# Patient Record
Sex: Female | Born: 1935
Health system: Southern US, Community
[De-identification: ages and names within clinical notes are randomized; demographics above are authoritative.]

## PROBLEM LIST (undated history)

## (undated) DIAGNOSIS — E079 Disorder of thyroid, unspecified: Secondary | ICD-10-CM

## (undated) DIAGNOSIS — K635 Polyp of colon: Secondary | ICD-10-CM

## (undated) DIAGNOSIS — D519 Vitamin B12 deficiency anemia, unspecified: Secondary | ICD-10-CM

## (undated) DIAGNOSIS — R443 Hallucinations, unspecified: Secondary | ICD-10-CM

## (undated) DIAGNOSIS — M542 Cervicalgia: Secondary | ICD-10-CM

## (undated) DIAGNOSIS — I10 Essential (primary) hypertension: Secondary | ICD-10-CM

## (undated) DIAGNOSIS — N179 Acute kidney failure, unspecified: Secondary | ICD-10-CM

## (undated) DIAGNOSIS — F0390 Unspecified dementia without behavioral disturbance: Secondary | ICD-10-CM

## (undated) DIAGNOSIS — E785 Hyperlipidemia, unspecified: Secondary | ICD-10-CM

## (undated) DIAGNOSIS — Q433 Congenital malformations of intestinal fixation: Secondary | ICD-10-CM

## (undated) DIAGNOSIS — I639 Cerebral infarction, unspecified: Secondary | ICD-10-CM

## (undated) DIAGNOSIS — E039 Hypothyroidism, unspecified: Secondary | ICD-10-CM

## (undated) DIAGNOSIS — G9341 Metabolic encephalopathy: Secondary | ICD-10-CM

## (undated) DIAGNOSIS — R45851 Suicidal ideations: Secondary | ICD-10-CM

## (undated) HISTORY — DX: Suicidal ideations: R45.851

## (undated) HISTORY — DX: Vitamin B12 deficiency anemia, unspecified: D51.9

## (undated) HISTORY — DX: Congenital malformations of intestinal fixation: Q43.3

## (undated) HISTORY — DX: Cervicalgia: M54.2

## (undated) HISTORY — DX: Cerebral infarction, unspecified: I63.9

## (undated) HISTORY — PX: TOTAL ABDOMINAL HYSTERECTOMY: SHX209

## (undated) HISTORY — DX: Hallucinations, unspecified: R44.3

## (undated) HISTORY — DX: Essential (primary) hypertension: I10

## (undated) HISTORY — DX: Metabolic encephalopathy: G93.41

## (undated) HISTORY — DX: Hyperlipidemia, unspecified: E78.5

## (undated) HISTORY — DX: Polyp of colon: K63.5

## (undated) HISTORY — PX: SHOULDER SURGERY: SHX246

## (undated) HISTORY — PX: CHOLECYSTECTOMY: SHX55

## (undated) HISTORY — DX: Acute kidney failure, unspecified: N17.9

## (undated) HISTORY — DX: Hypothyroidism, unspecified: E03.9

---

## 1981-07-20 HISTORY — PX: LUMBAR SPINE SURGERY: SHX701

## 1998-12-18 ENCOUNTER — Other Ambulatory Visit: Admission: RE | Admit: 1998-12-18 | Discharge: 1998-12-18 | Payer: Self-pay | Admitting: Obstetrics and Gynecology

## 2000-01-14 ENCOUNTER — Other Ambulatory Visit: Admission: RE | Admit: 2000-01-14 | Discharge: 2000-01-14 | Payer: Self-pay | Admitting: Obstetrics and Gynecology

## 2000-08-11 ENCOUNTER — Ambulatory Visit (HOSPITAL_COMMUNITY): Admission: RE | Admit: 2000-08-11 | Discharge: 2000-08-11 | Payer: Self-pay | Admitting: Internal Medicine

## 2001-02-07 ENCOUNTER — Other Ambulatory Visit: Admission: RE | Admit: 2001-02-07 | Discharge: 2001-02-07 | Payer: Self-pay | Admitting: Obstetrics and Gynecology

## 2001-07-20 HISTORY — PX: OTHER SURGICAL HISTORY: SHX169

## 2001-08-04 ENCOUNTER — Observation Stay (HOSPITAL_COMMUNITY): Admission: RE | Admit: 2001-08-04 | Discharge: 2001-08-05 | Payer: Self-pay | Admitting: Orthopedic Surgery

## 2001-12-20 ENCOUNTER — Encounter: Payer: Self-pay | Admitting: Specialist

## 2001-12-20 ENCOUNTER — Ambulatory Visit (HOSPITAL_COMMUNITY): Admission: RE | Admit: 2001-12-20 | Discharge: 2001-12-20 | Payer: Self-pay | Admitting: Specialist

## 2002-02-22 ENCOUNTER — Other Ambulatory Visit: Admission: RE | Admit: 2002-02-22 | Discharge: 2002-02-22 | Payer: Self-pay | Admitting: Gynecology

## 2003-01-15 ENCOUNTER — Encounter: Payer: Self-pay | Admitting: Specialist

## 2003-01-15 ENCOUNTER — Encounter: Admission: RE | Admit: 2003-01-15 | Discharge: 2003-01-15 | Payer: Self-pay | Admitting: Specialist

## 2003-03-01 ENCOUNTER — Other Ambulatory Visit: Admission: RE | Admit: 2003-03-01 | Discharge: 2003-03-01 | Payer: Self-pay | Admitting: Gynecology

## 2003-03-29 ENCOUNTER — Encounter: Payer: Self-pay | Admitting: Neurology

## 2003-03-29 ENCOUNTER — Ambulatory Visit (HOSPITAL_COMMUNITY): Admission: RE | Admit: 2003-03-29 | Discharge: 2003-03-29 | Payer: Self-pay | Admitting: Neurology

## 2003-12-18 ENCOUNTER — Ambulatory Visit (HOSPITAL_COMMUNITY): Admission: RE | Admit: 2003-12-18 | Discharge: 2003-12-18 | Payer: Self-pay | Admitting: Specialist

## 2003-12-20 ENCOUNTER — Ambulatory Visit (HOSPITAL_COMMUNITY): Admission: RE | Admit: 2003-12-20 | Discharge: 2003-12-20 | Payer: Self-pay | Admitting: Gastroenterology

## 2004-03-03 ENCOUNTER — Other Ambulatory Visit: Admission: RE | Admit: 2004-03-03 | Discharge: 2004-03-03 | Payer: Self-pay | Admitting: Gynecology

## 2005-05-22 ENCOUNTER — Encounter: Admission: RE | Admit: 2005-05-22 | Discharge: 2005-05-22 | Payer: Self-pay | Admitting: Orthopedic Surgery

## 2006-02-17 HISTORY — PX: OTHER SURGICAL HISTORY: SHX169

## 2006-04-13 ENCOUNTER — Ambulatory Visit (HOSPITAL_BASED_OUTPATIENT_CLINIC_OR_DEPARTMENT_OTHER): Admission: RE | Admit: 2006-04-13 | Discharge: 2006-04-13 | Payer: Self-pay | Admitting: Orthopedic Surgery

## 2006-10-09 ENCOUNTER — Emergency Department (HOSPITAL_COMMUNITY): Admission: EM | Admit: 2006-10-09 | Discharge: 2006-10-09 | Payer: Self-pay | Admitting: Emergency Medicine

## 2007-01-04 ENCOUNTER — Encounter: Admission: RE | Admit: 2007-01-04 | Discharge: 2007-01-04 | Payer: Self-pay | Admitting: Internal Medicine

## 2007-01-06 ENCOUNTER — Ambulatory Visit (HOSPITAL_COMMUNITY): Admission: RE | Admit: 2007-01-06 | Discharge: 2007-01-06 | Payer: Self-pay | Admitting: Internal Medicine

## 2007-03-03 ENCOUNTER — Other Ambulatory Visit: Admission: RE | Admit: 2007-03-03 | Discharge: 2007-03-03 | Payer: Self-pay | Admitting: Gynecology

## 2007-07-29 ENCOUNTER — Ambulatory Visit: Payer: Self-pay | Admitting: Vascular Surgery

## 2007-10-20 ENCOUNTER — Encounter: Admission: RE | Admit: 2007-10-20 | Discharge: 2007-10-20 | Payer: Self-pay | Admitting: Neurosurgery

## 2009-03-22 ENCOUNTER — Encounter: Admission: RE | Admit: 2009-03-22 | Discharge: 2009-03-22 | Payer: Self-pay | Admitting: Gastroenterology

## 2009-07-20 HISTORY — PX: OTHER SURGICAL HISTORY: SHX169

## 2010-03-12 ENCOUNTER — Encounter: Payer: Self-pay | Admitting: Internal Medicine

## 2010-07-11 ENCOUNTER — Ambulatory Visit: Payer: Self-pay | Admitting: Internal Medicine

## 2010-07-11 DIAGNOSIS — E785 Hyperlipidemia, unspecified: Secondary | ICD-10-CM | POA: Insufficient documentation

## 2010-07-11 DIAGNOSIS — I10 Essential (primary) hypertension: Secondary | ICD-10-CM

## 2010-07-11 DIAGNOSIS — R05 Cough: Secondary | ICD-10-CM

## 2010-07-11 DIAGNOSIS — R0602 Shortness of breath: Secondary | ICD-10-CM

## 2010-08-10 ENCOUNTER — Encounter: Payer: Self-pay | Admitting: Specialist

## 2010-08-21 NOTE — Assessment & Plan Note (Signed)
Summary: Pulmonary/ new pt eval > try off ace   Visit Type:  Initial Consult Copy to:  Dr. Jarome Matin Primary Provider/Referring Provider:  Dr. Jarome Matin  CC:  Dyspnea.  History of Present Illness: 18 yowf never smoker no previous h/o resp problems even as child referred by Dr Jarold Motto for recurrent cough since Aug 2011  July 11, 2010  1st pulmonary office eval cc chronic cough since Aug 2011 never completely better with nl cxr then but much worse early Dec much better since rx with pred and zpak and heavy cough suppression and some better on day on ov. Only sob when coughing.    Pt denies any significant sore throat, dysphagia, itching, sneezing,  nasal congestion or excess secretions,  fever, chills, sweats, unintended wt loss, pleuritic or exertional cp, hempoptysis, change in activity tolerance  orthopnea pnd or leg swelling. Pt also denies any obvious fluctuation in symptoms with weather or environmental change or other alleviating or aggravating factors.       Current Medications (verified): 1)  Synthroid 125 Mcg Tabs (Levothyroxine Sodium) .Marland Kitchen.. 1 Once Daily 2)  Lipitor 40 Mg Tabs (Atorvastatin Calcium) .Marland Kitchen.. 1 Once Daily 3)  Lisinopril 5 Mg Tabs (Lisinopril) .Marland Kitchen.. 1 Once Daily 4)  [facility] Aspirin 81 Mg Tbec (Aspirin) .Marland Kitchen.. 1 Once Daily 5)  Multivitamins  Tabs (Multiple Vitamin) .Marland Kitchen.. 1 Once Daily 6)  Zolpidem Tartrate 10 Mg Tabs (Zolpidem Tartrate) .Marland Kitchen.. 1 To 2 At Bedtime As Needed  Allergies (verified): 1)  ! Pcn  Past History:  Past Medical History: Hypertension    - Try off ace July 11, 2010 due to cough  Past Surgical History: Cholecystectomy  1992 Back surgery 1980 Hysterectomy 1970 Rt foot surgery 2011  Family History: Negative for respiratory diseases or atopy   Social History: Married Chiildren Retired Recruitment consultant Never smoker  No ETOH  Review of Systems       The patient complains of shortness of breath with activity, shortness  of breath at rest, productive cough, and change in color of mucus.  The patient denies non-productive cough, coughing up blood, chest pain, irregular heartbeats, acid heartburn, indigestion, loss of appetite, weight change, abdominal pain, difficulty swallowing, sore throat, tooth/dental problems, headaches, nasal congestion/difficulty breathing through nose, sneezing, itching, ear ache, anxiety, depression, hand/feet swelling, joint stiffness or pain, rash, and fever.    Vital Signs:  Patient profile:   76 year old female Height:      65 inches Weight:      184.38 pounds BMI:     30.79 O2 Sat:      96 % on Room air Temp:     97.7 degrees F oral Pulse rate:   85 / minute BP sitting:   136 / 82  (left arm)  Vitals Entered By: Vernie Murders (July 11, 2010 11:17 AM)  O2 Flow:  Room air  Physical Exam  Additional Exam:  anxious wf nad wt 184 July 11, 2010 pseudowheeze resolves with purse lip maneuver  HEENT: nl dentition, turbinates, and orophanx. Nl external ear canals without cough reflex Neck without JVD/Nodes/TM Lungs clear to A and P bilaterally without cough on insp or exp maneuvers RRR no s3 or murmur or increase in P2 Abd soft and benign with nl excursion in the supine position. No bruits or organomegaly Ext warm without calf tenderness, cyanosis clubbing or edema Skin warm and dry without lesions   Neuro alert, approp without deficits   CXR  Procedure date:  03/12/2010  Findings:      No active disease  Impression & Recommendations:  Problem # 1:  COUGH (ICD-786.2) The most common causes of chronic cough in immunocompetent adults include: upper airway cough syndrome (UACS), previously referred to as postnasal drip syndrome,  caused by variety of rhinosinus conditions; (2) asthma; (3) GERD; (4) chronic bronchitis from cigarette smoking or other inhaled environmental irritants; (5) nonasthmatic eosinophilic bronchitis; and (6) bronchiectasis. These conditions,  singly or in combination, have accounted for up to 94% of the causes of chronic cough in prospective studies.   Classic Upper airway cough syndrome, so named because it's frequently impossible to sort out how much is  CR/sinusitis with freq throat clearing (which can be related to primary GERD)   vs  causing  secondary extra esophageal GERD from wide swings in gastric pressure that occur with throat clearing, promoting self use of mint and menthol lozenges that reduce the lower esophageal sphincter tone and exacerbate the problem further These are the same pts who not infrequently have failed to tolerate ace inhibitors,  dry powder inhalers or biphosphonates or report having reflux symptoms that don't respond to standard doses of PPI  Try off ace first then regroup if not better  Orders: New Patient Level V (95621)  Medications Added to Medication List This Visit: 1)  Synthroid 125 Mcg Tabs (Levothyroxine sodium) .Marland Kitchen.. 1 once daily 2)  Lipitor 40 Mg Tabs (Atorvastatin calcium) .Marland Kitchen.. 1 once daily 3)  Lisinopril 5 Mg Tabs (Lisinopril) .Marland Kitchen.. 1 once daily 4)  [facility] Aspirin 81 Mg Tbec (Aspirin) .Marland Kitchen.. 1 once daily 5)  Multivitamins Tabs (Multiple vitamin) .Marland Kitchen.. 1 once daily 6)  Zolpidem Tartrate 10 Mg Tabs (Zolpidem tartrate) .Marland Kitchen.. 1 to 2 at bedtime as needed 7)  Diovan 80 Mg Tabs (Valsartan) .... One daily  Patient Instructions: 1)  Prilosec before bfast and pepcid 20 mg at bedtime as long as coughing ( reflux is to cough what oxygen is to fire)  2)  Stop lisinopril 3)  Start Diovan 80 mg one daily and if 100% better tell Dr Jarold Motto and he will give you a substitute for lisnopril and if not return here.

## 2010-12-02 NOTE — Procedures (Signed)
DUPLEX DEEP VENOUS EXAM - LOWER EXTREMITY   INDICATION:  Right thigh pain.   HISTORY:  Edema:  No.  Trauma/Surgery:  No.  Pain:  Yes.  PE:  No.  Previous DVT:  No.  Anticoagulants:  Other:   DUPLEX EXAM:                CFV   SFV   PopV  PTV    GSV                R  L  R  L  R  L  R   L  R  L  Thrombosis    o  o  o     o     o      o  Spontaneous   +  +  +     +     +      +  Phasic        +  +  +     +     +      +  Augmentation  +  +  +     +     +      +  Compressible  +  +  +     +     +      +  Competent     +  +  +     +     +      +   Legend:  + - yes  o - no  p - partial  D - decreased   IMPRESSION:  No evidence of deep venous thrombosis noted in the right  leg.   Notify D.J. at Dr. Silvano Rusk office.    _____________________________  Di Kindle. Edilia Bo, M.D.   MG/MEDQ  D:  07/29/2007  T:  07/29/2007  Job:  [id number]

## 2010-12-05 NOTE — Op Note (Signed)
NAMELUCILL, Carpenter               ACCOUNT NO.:  [id number]   MEDICAL RECORD NO.:  [id number]          PATIENT TYPE:  AMB   LOCATION:  DSC                          FACILITY:  [facility]   PHYSICIAN:  Katy Fitch. Sypher, M.D. DATE OF BIRTH:  11/08/1934   DATE OF PROCEDURE:  04/13/2006  DATE OF DISCHARGE:                                 OPERATIVE REPORT   PREOPERATIVE DIAGNOSIS:  Mass left ring finger flexor sheath with adjacent  palmar fibromatosis.   POSTOPERATIVE DIAGNOSIS:  Mass left ring finger flexor sheath with adjacent  palmar fibromatosis with identification of palmar fibromatosis involving the  pre-tendinous fibers of the left ring finger and an A1 pulley ganglion.   OPERATION:  Excision of flexor ganglion and release of the tendinous fibers  of palmar fascia superficial to the left ring flexor sheath.   OPERATIONS:  Katy Fitch. Sypher, M.D.   ASSISTANT:  Marveen Reeks. Dasnoit, P.A.-C.   ANESTHESIA:  General sedation and 2% lidocaine metacarpal head level block  of left ring finger.   SUPERVISING ANESTHESIOLOGIST:  Germaine Pomfret, M.D.   INDICATIONS:  Rachael Carpenter is a 76 year old woman referred through the  courtesy of Dr. Dossie Arbour for evaluation and management of a mass in her  left palm.  Clinical examination revealed signs of a flexor sheath ganglion  and early palmar fibromatosis. After informed consent, she is brought to the  operating at this time anticipating excisional biopsy of the mass.   PROCEDURE:  Rachael Carpenter is brought to the operating room and placed in the  supine position on the operating room table.  Following light sedation, the  left arm was prepped with Betadine soap solution and sterilely draped.  2%  lidocaine was infiltrated in the flexor sheath and into the path of the  intended incision.  When anesthesia was satisfactory, the arm was  exsanguinated with an Esmarch bandage and an arterial tourniquet on the  proximal brachium inflated to w50  mmHg.  The procedure commenced with a  transverse incision just distal to the distal palmar crease.  The  subcutaneous tissues were carefully divided revealing the palmar fascia.  The contracted bands of the per-tendinous fibers were resected.  The flexor  sheath ganglion was then circumferentially dissected.  The mass was removed  with rongeurs.  The flexion sheath was cleaned to normal tissues.  The wound  was then repaired with mattress suture of 5-0 nylon.  There were no apparent  complications.  The wound was dressed with Xeroflow, sterile gauze, and a  Coban dressing.   For aftercare, Rachael Carpenter is provided with a prescription for Darvocet  N100, 1 p.o. q.4-6h. p.r.n. pain, 20 tablets without refill.  She will  return to see me in the office for follow-up in one week.      Katy Fitch Sypher, M.D.  Electronically Signed     RVS/MEDQ  D:  04/13/2006  T:  04/14/2006  Job:  [id number]   cc:   Barry Dienes. Eloise Harman, M.D.

## 2010-12-05 NOTE — Op Note (Signed)
NAME:  Rachael Carpenter, Rachael Carpenter                         ACCOUNT NO.:  [id number]   MEDICAL RECORD NO.:  [id number]                   PATIENT TYPE:  AMB   LOCATION:  ENDO                                 FACILITY:  [facility]   PHYSICIAN:  Bernette Redbird, M.D.                DATE OF BIRTH:  12/11/1934   DATE OF PROCEDURE:  12/20/2003  DATE OF DISCHARGE:                                 OPERATIVE REPORT   PROCEDURE:  Colonoscopy   INDICATIONS:  Family history of colon cancer.  Colonoscopy six years ago  negative.   FINDINGS:  Normal exam to the cecum.   DESCRIPTION OF PROCEDURE:  The nature, purpose and risk of the procedure  were familiar to the patient from prior examination.  She provided consent.  Sedation was fentanyl 62.5 mcg and Versed 6 mg IV; without arrhythmias or  desaturation.  The Olympus adjustable tension pediatric video colonoscope  was readily advanced to the cecum, using some external abdominal compression  that controlled looping.  The cecum was identified by clear visualization of  the appendiceal orifice.  Pullback was then performed.  The quality of the  prep was excellent.  It was felt that all areas were well seen.   This was a normal examination.  No polyps, cancer, vascular malformations or  diverticulosis were noted.  Retroflexion of the rectum was normal.  No  biopsies were obtained.  She tolerated the procedure well and without  apparent complications.   IMPRESSION:  Normal colonoscopy, in a patient with a family history of colon  cancer (V16.0).   PLAN:  Follow-up colonoscopy in five years for continued screening, in a  patient who is probably at somewhat increased risk for colon cancer in view  of her family history.                                               Bernette Redbird, M.D.    RB/MEDQ  D:  12/20/2003  T:  12/20/2003  Job:  [id number]   cc:   Barry Dienes. Eloise Harman, M.D.  [address]  [place]  [place] [address]  Fax: [phone]

## 2010-12-05 NOTE — Op Note (Signed)
[facility]  Patient:    Rachael Carpenter, Rachael Carpenter Visit Number: [id number] MRN: [id number]          Service Type: SUR Location: 4W 9562 01 Attending Physician:  Verlee Rossetti. Dictated by:   Malon Kindle, M.D. Proc. Date: 08/04/01 Admit Date:  08/04/2001 Discharge Date: 08/05/2001                             Operative Report  PREOPERATIVE DIAGNOSES: 1. Right shoulder chronic impingement syndrome. 2. Acromioclavicular joint arthrosis.  POSTOPERATIVE DIAGNOSES: 1. Right shoulder type 1 superior labral anterior and posterior tear. 2. Chronic impingement syndrome. 3. Acromioclavicular joint arthrosis.  PROCEDURE PERFORMED: 1. Right shoulder arthroscopy. 2. Debridement of type 1 superior labral anterior and posterior tear. 3. Arthroscopic subacromial decompression and open distal clavicle excision.  SURGEON:  Almedia Balls. Ranell Patrick, M.D.  FIRST ASSISTANT:  Colleen P. Mahar, P.A.-C.  ANESTHESIA:  General plus intrascalene block.  ESTIMATED BLOOD LOSS:  Minimal.  FLUID REPLACEMENT:  1200 cc crystalloid.  INSTRUMENT COUNTS:  Correct.  COMPLICATIONS:  None.  Perioperative antibiotics were given.  INDICATIONS:  The patient is a 76 year old right-hand dominant female, who presents complaining of several months of increasing right shoulder pain.  Her pain has been located in the lateral and posterior aspects of the shoulder and refractory to conservative management with physical therapy and subacromial injection and activity modifications.  She did have good relief of her symptoms with a subacromial shot.  The patient has x-rays and MRI scan consistent with a possible type 1 SLAP lesion, chronic impingement syndrome with supraspinatus tendinopathy, subacromial spur formation and AC joint arthrosis.  After discussion with patient regarding options for management to include continued conservative management versus surgical evaluation and treatment, she  elected to proceed with surgery.  Informed consent was signed.  DESCRIPTION OF OPERATION:  After an adequate level of general anesthesia and intrascalene block anesthesia was achieved and 1 g of Ancef was given preoperatively, the patient was positioned in the modified beach chair position.  All neurovascular structures were padded appropriately.  The right shoulder was examined under anesthesia.  There was noted to be forward flexion of 130 degrees, abduction 85 degrees; internal and external rotation were 60 degrees either side.  There was negative sulcus, negative anteroposterior drawer, negative load and shift.  After completion of the EUA, the right shoulder was prepped and draped in its entirety in the usual sterile fashion. Diagnostic arthroscopy was performed through standard anterior and posterior arthroscopic portals.  These were created in a similar fashion with infiltration of the skin with 0.5% Marcaine with epinephrine followed by incision with an 11 blade scalpel and introduction of cannula into the joint using the blunt obturator.  Diagnostic arthroscopy revealed an intact biceps anchor; however, there was noted to be a type 1 superior labral anterior and posterior tear with some fraying and disruption of the labral glenoid interface.  This was debrided using a full-radius resector and a curette. This was a stable lesion; however, and it is expected to heal on its own without stabilization operatively.  There was noted to be a sublabral hole and cord-like middle glenohumeral ligament consistent with a Buford complex. Subscapularis was noted to be intact.  Biceps was intact with no evidence of fraying or erythema.  The rotator cuff appeared normal.  Inferior pouch was capacious, but there were no loose bodies noted.  Inferior and posterior labrum appeared normal.  After  completion of the intraarticular portion of the arthroscopy, the scope was placed in the subacromial space.   A thorough bursectomy was performed and additional lateral portals created.  The Arthrocare unit was then used to remove periosteum over the anterior aspect of the acromion only and the anterior osteophytes, which were fairly small, were removed.  The scope was placed in the lateral portal, and the shaver and bur were brought up posteriorly to ensure a colinear resection of bone to create a type 1 acromion.  The [facility] joint was visualized easily.  At this point, the scope was concluded after completion of the acromioplasty, and a small saber-type incision was created directly overlying the distal clavicle.  This was done using a 10 blade scalpel.  Dissection was carried sharply down through the subcutaneous tissues using the Bovie electrocautery.  The deltotrapezius fascia was identified and incised in line with the distal clavicle.  Subperiosteal dissection was done of the distal clavicle, and the distal 1 cm was removed.  Operating surgeons finger was placed in the interval after resection of the distal clavicle, and the arm was taken through a full range of motion to ensure no impingement.  Bone wax was placed over the distal end of the clavicle.  The wound was thoroughly irrigated and closed. Figure-of-eight 0 Ethibond sutures were used to close the deltotrapezius interval with buried knots.  Subcu closed with 2-0 Vicryl and the skin with 4-0 Monocryl.  The portals were also closed using 4-0 Monocryl.  Sterile dressings applied followed by shoulder sling.  The patient tolerated the procedure well and was taken to PACU in stable condition. Dictated by:   Malon Kindle, M.D. Attending Physician:  Malon Kindle R. DD:  08/04/01 TD:  08/07/01 Job: [id number]

## 2011-11-30 DIAGNOSIS — H2510 Age-related nuclear cataract, unspecified eye: Secondary | ICD-10-CM | POA: Diagnosis not present

## 2011-11-30 DIAGNOSIS — H524 Presbyopia: Secondary | ICD-10-CM | POA: Diagnosis not present

## 2011-11-30 DIAGNOSIS — H5200 Hypermetropia, unspecified eye: Secondary | ICD-10-CM | POA: Diagnosis not present

## 2011-11-30 DIAGNOSIS — H52209 Unspecified astigmatism, unspecified eye: Secondary | ICD-10-CM | POA: Diagnosis not present

## 2012-02-10 DIAGNOSIS — Z1231 Encounter for screening mammogram for malignant neoplasm of breast: Secondary | ICD-10-CM | POA: Diagnosis not present

## 2012-04-11 DIAGNOSIS — I10 Essential (primary) hypertension: Secondary | ICD-10-CM | POA: Diagnosis not present

## 2012-04-11 DIAGNOSIS — IMO0002 Reserved for concepts with insufficient information to code with codable children: Secondary | ICD-10-CM | POA: Diagnosis not present

## 2012-04-12 DIAGNOSIS — M4310 Spondylolisthesis, site unspecified: Secondary | ICD-10-CM | POA: Diagnosis not present

## 2012-04-12 DIAGNOSIS — M961 Postlaminectomy syndrome, not elsewhere classified: Secondary | ICD-10-CM | POA: Diagnosis not present

## 2012-04-12 DIAGNOSIS — M5137 Other intervertebral disc degeneration, lumbosacral region: Secondary | ICD-10-CM | POA: Diagnosis not present

## 2012-04-18 DIAGNOSIS — M5137 Other intervertebral disc degeneration, lumbosacral region: Secondary | ICD-10-CM | POA: Diagnosis not present

## 2012-04-22 DIAGNOSIS — M545 Low back pain: Secondary | ICD-10-CM | POA: Diagnosis not present

## 2012-04-22 DIAGNOSIS — M5137 Other intervertebral disc degeneration, lumbosacral region: Secondary | ICD-10-CM | POA: Diagnosis not present

## 2012-04-22 DIAGNOSIS — IMO0002 Reserved for concepts with insufficient information to code with codable children: Secondary | ICD-10-CM | POA: Diagnosis not present

## 2012-05-16 DIAGNOSIS — E785 Hyperlipidemia, unspecified: Secondary | ICD-10-CM | POA: Diagnosis not present

## 2012-05-16 DIAGNOSIS — Z79899 Other long term (current) drug therapy: Secondary | ICD-10-CM | POA: Diagnosis not present

## 2012-05-16 DIAGNOSIS — R82998 Other abnormal findings in urine: Secondary | ICD-10-CM | POA: Diagnosis not present

## 2012-05-16 DIAGNOSIS — E039 Hypothyroidism, unspecified: Secondary | ICD-10-CM | POA: Diagnosis not present

## 2012-05-19 DIAGNOSIS — M5137 Other intervertebral disc degeneration, lumbosacral region: Secondary | ICD-10-CM | POA: Diagnosis not present

## 2012-05-23 DIAGNOSIS — I10 Essential (primary) hypertension: Secondary | ICD-10-CM | POA: Diagnosis not present

## 2012-05-23 DIAGNOSIS — Z23 Encounter for immunization: Secondary | ICD-10-CM | POA: Diagnosis not present

## 2012-05-23 DIAGNOSIS — Z1331 Encounter for screening for depression: Secondary | ICD-10-CM | POA: Diagnosis not present

## 2012-05-23 DIAGNOSIS — IMO0002 Reserved for concepts with insufficient information to code with codable children: Secondary | ICD-10-CM | POA: Diagnosis not present

## 2012-05-23 DIAGNOSIS — Z0000 Encounter for general adult medical examination without abnormal findings: Secondary | ICD-10-CM | POA: Diagnosis not present

## 2012-05-23 DIAGNOSIS — E039 Hypothyroidism, unspecified: Secondary | ICD-10-CM | POA: Diagnosis not present

## 2012-05-26 DIAGNOSIS — Z1212 Encounter for screening for malignant neoplasm of rectum: Secondary | ICD-10-CM | POA: Diagnosis not present

## 2012-06-07 DIAGNOSIS — M545 Low back pain, unspecified: Secondary | ICD-10-CM | POA: Diagnosis not present

## 2012-06-07 DIAGNOSIS — M5126 Other intervertebral disc displacement, lumbar region: Secondary | ICD-10-CM | POA: Diagnosis not present

## 2012-06-07 DIAGNOSIS — M47817 Spondylosis without myelopathy or radiculopathy, lumbosacral region: Secondary | ICD-10-CM | POA: Diagnosis not present

## 2012-06-07 DIAGNOSIS — M5137 Other intervertebral disc degeneration, lumbosacral region: Secondary | ICD-10-CM | POA: Diagnosis not present

## 2012-06-07 DIAGNOSIS — IMO0002 Reserved for concepts with insufficient information to code with codable children: Secondary | ICD-10-CM | POA: Diagnosis not present

## 2012-06-09 DIAGNOSIS — M5137 Other intervertebral disc degeneration, lumbosacral region: Secondary | ICD-10-CM | POA: Diagnosis not present

## 2012-06-09 DIAGNOSIS — M47817 Spondylosis without myelopathy or radiculopathy, lumbosacral region: Secondary | ICD-10-CM | POA: Diagnosis not present

## 2012-07-08 DIAGNOSIS — M5137 Other intervertebral disc degeneration, lumbosacral region: Secondary | ICD-10-CM | POA: Diagnosis not present

## 2013-01-10 DIAGNOSIS — I10 Essential (primary) hypertension: Secondary | ICD-10-CM | POA: Diagnosis not present

## 2013-01-10 DIAGNOSIS — IMO0002 Reserved for concepts with insufficient information to code with codable children: Secondary | ICD-10-CM | POA: Diagnosis not present

## 2013-02-01 DIAGNOSIS — E669 Obesity, unspecified: Secondary | ICD-10-CM | POA: Diagnosis not present

## 2013-02-01 DIAGNOSIS — M48061 Spinal stenosis, lumbar region without neurogenic claudication: Secondary | ICD-10-CM | POA: Diagnosis not present

## 2013-02-01 DIAGNOSIS — M545 Low back pain, unspecified: Secondary | ICD-10-CM | POA: Diagnosis not present

## 2013-02-01 DIAGNOSIS — M4310 Spondylolisthesis, site unspecified: Secondary | ICD-10-CM | POA: Diagnosis not present

## 2013-02-02 ENCOUNTER — Other Ambulatory Visit: Payer: Self-pay | Admitting: Neurosurgery

## 2013-02-02 ENCOUNTER — Ambulatory Visit
Admission: RE | Admit: 2013-02-02 | Discharge: 2013-02-02 | Disposition: A | Discharge status: Home / Self Care | Payer: Medicare Other | Source: Ambulatory Visit | Attending: Neurosurgery | Admitting: Neurosurgery

## 2013-02-02 DIAGNOSIS — M5126 Other intervertebral disc displacement, lumbar region: Secondary | ICD-10-CM | POA: Diagnosis not present

## 2013-02-02 DIAGNOSIS — M47817 Spondylosis without myelopathy or radiculopathy, lumbosacral region: Secondary | ICD-10-CM | POA: Diagnosis not present

## 2013-02-02 DIAGNOSIS — IMO0002 Reserved for concepts with insufficient information to code with codable children: Secondary | ICD-10-CM

## 2013-02-02 DIAGNOSIS — M48061 Spinal stenosis, lumbar region without neurogenic claudication: Secondary | ICD-10-CM | POA: Diagnosis not present

## 2013-02-03 DIAGNOSIS — M48062 Spinal stenosis, lumbar region with neurogenic claudication: Secondary | ICD-10-CM | POA: Diagnosis not present

## 2013-02-03 DIAGNOSIS — M47817 Spondylosis without myelopathy or radiculopathy, lumbosacral region: Secondary | ICD-10-CM | POA: Diagnosis not present

## 2013-02-03 DIAGNOSIS — M5126 Other intervertebral disc displacement, lumbar region: Secondary | ICD-10-CM | POA: Diagnosis not present

## 2013-02-03 DIAGNOSIS — E669 Obesity, unspecified: Secondary | ICD-10-CM | POA: Diagnosis not present

## 2013-02-09 DIAGNOSIS — IMO0002 Reserved for concepts with insufficient information to code with codable children: Secondary | ICD-10-CM | POA: Diagnosis not present

## 2013-02-09 DIAGNOSIS — M47817 Spondylosis without myelopathy or radiculopathy, lumbosacral region: Secondary | ICD-10-CM | POA: Diagnosis not present

## 2013-02-09 DIAGNOSIS — M48061 Spinal stenosis, lumbar region without neurogenic claudication: Secondary | ICD-10-CM | POA: Diagnosis not present

## 2013-02-09 DIAGNOSIS — M5137 Other intervertebral disc degeneration, lumbosacral region: Secondary | ICD-10-CM | POA: Diagnosis not present

## 2013-02-24 DIAGNOSIS — Z1231 Encounter for screening mammogram for malignant neoplasm of breast: Secondary | ICD-10-CM | POA: Diagnosis not present

## 2013-03-21 DIAGNOSIS — M4120 Other idiopathic scoliosis, site unspecified: Secondary | ICD-10-CM | POA: Diagnosis not present

## 2013-03-21 DIAGNOSIS — M47817 Spondylosis without myelopathy or radiculopathy, lumbosacral region: Secondary | ICD-10-CM | POA: Diagnosis not present

## 2013-03-21 DIAGNOSIS — M48061 Spinal stenosis, lumbar region without neurogenic claudication: Secondary | ICD-10-CM | POA: Diagnosis not present

## 2013-03-21 DIAGNOSIS — M5137 Other intervertebral disc degeneration, lumbosacral region: Secondary | ICD-10-CM | POA: Diagnosis not present

## 2013-04-06 DIAGNOSIS — M47817 Spondylosis without myelopathy or radiculopathy, lumbosacral region: Secondary | ICD-10-CM | POA: Diagnosis not present

## 2013-04-06 DIAGNOSIS — M5126 Other intervertebral disc displacement, lumbar region: Secondary | ICD-10-CM | POA: Diagnosis not present

## 2013-04-06 DIAGNOSIS — M5137 Other intervertebral disc degeneration, lumbosacral region: Secondary | ICD-10-CM | POA: Diagnosis not present

## 2013-04-06 DIAGNOSIS — M48061 Spinal stenosis, lumbar region without neurogenic claudication: Secondary | ICD-10-CM | POA: Diagnosis not present

## 2013-05-23 DIAGNOSIS — M48062 Spinal stenosis, lumbar region with neurogenic claudication: Secondary | ICD-10-CM | POA: Diagnosis not present

## 2013-05-23 DIAGNOSIS — M5126 Other intervertebral disc displacement, lumbar region: Secondary | ICD-10-CM | POA: Diagnosis not present

## 2013-05-23 DIAGNOSIS — M47817 Spondylosis without myelopathy or radiculopathy, lumbosacral region: Secondary | ICD-10-CM | POA: Diagnosis not present

## 2013-05-23 DIAGNOSIS — IMO0002 Reserved for concepts with insufficient information to code with codable children: Secondary | ICD-10-CM | POA: Diagnosis not present

## 2013-05-26 DIAGNOSIS — R82998 Other abnormal findings in urine: Secondary | ICD-10-CM | POA: Diagnosis not present

## 2013-05-26 DIAGNOSIS — E039 Hypothyroidism, unspecified: Secondary | ICD-10-CM | POA: Diagnosis not present

## 2013-05-26 DIAGNOSIS — I10 Essential (primary) hypertension: Secondary | ICD-10-CM | POA: Diagnosis not present

## 2013-05-26 DIAGNOSIS — E785 Hyperlipidemia, unspecified: Secondary | ICD-10-CM | POA: Diagnosis not present

## 2013-06-02 DIAGNOSIS — R3129 Other microscopic hematuria: Secondary | ICD-10-CM | POA: Diagnosis not present

## 2013-06-02 DIAGNOSIS — IMO0002 Reserved for concepts with insufficient information to code with codable children: Secondary | ICD-10-CM | POA: Diagnosis not present

## 2013-06-02 DIAGNOSIS — Z6834 Body mass index (BMI) 34.0-34.9, adult: Secondary | ICD-10-CM | POA: Diagnosis not present

## 2013-06-02 DIAGNOSIS — Z0000 Encounter for general adult medical examination without abnormal findings: Secondary | ICD-10-CM | POA: Diagnosis not present

## 2013-06-02 DIAGNOSIS — Z1331 Encounter for screening for depression: Secondary | ICD-10-CM | POA: Diagnosis not present

## 2013-06-02 DIAGNOSIS — K219 Gastro-esophageal reflux disease without esophagitis: Secondary | ICD-10-CM | POA: Diagnosis not present

## 2013-06-02 DIAGNOSIS — I10 Essential (primary) hypertension: Secondary | ICD-10-CM | POA: Diagnosis not present

## 2013-06-02 DIAGNOSIS — E785 Hyperlipidemia, unspecified: Secondary | ICD-10-CM | POA: Diagnosis not present

## 2013-06-02 DIAGNOSIS — E039 Hypothyroidism, unspecified: Secondary | ICD-10-CM | POA: Diagnosis not present

## 2013-10-03 DIAGNOSIS — M47817 Spondylosis without myelopathy or radiculopathy, lumbosacral region: Secondary | ICD-10-CM | POA: Diagnosis not present

## 2013-10-03 DIAGNOSIS — Z6832 Body mass index (BMI) 32.0-32.9, adult: Secondary | ICD-10-CM | POA: Diagnosis not present

## 2013-10-03 DIAGNOSIS — M48062 Spinal stenosis, lumbar region with neurogenic claudication: Secondary | ICD-10-CM | POA: Diagnosis not present

## 2013-10-03 DIAGNOSIS — M5137 Other intervertebral disc degeneration, lumbosacral region: Secondary | ICD-10-CM | POA: Diagnosis not present

## 2013-10-11 DIAGNOSIS — M5137 Other intervertebral disc degeneration, lumbosacral region: Secondary | ICD-10-CM | POA: Diagnosis not present

## 2013-10-11 DIAGNOSIS — M47817 Spondylosis without myelopathy or radiculopathy, lumbosacral region: Secondary | ICD-10-CM | POA: Diagnosis not present

## 2013-10-11 DIAGNOSIS — M4310 Spondylolisthesis, site unspecified: Secondary | ICD-10-CM | POA: Diagnosis not present

## 2013-10-11 DIAGNOSIS — M48062 Spinal stenosis, lumbar region with neurogenic claudication: Secondary | ICD-10-CM | POA: Diagnosis not present

## 2014-03-27 DIAGNOSIS — M48062 Spinal stenosis, lumbar region with neurogenic claudication: Secondary | ICD-10-CM | POA: Diagnosis not present

## 2014-03-27 DIAGNOSIS — M4310 Spondylolisthesis, site unspecified: Secondary | ICD-10-CM | POA: Diagnosis not present

## 2014-03-27 DIAGNOSIS — M546 Pain in thoracic spine: Secondary | ICD-10-CM | POA: Diagnosis not present

## 2014-03-27 DIAGNOSIS — M5137 Other intervertebral disc degeneration, lumbosacral region: Secondary | ICD-10-CM | POA: Diagnosis not present

## 2014-03-27 DIAGNOSIS — M47817 Spondylosis without myelopathy or radiculopathy, lumbosacral region: Secondary | ICD-10-CM | POA: Diagnosis not present

## 2014-03-29 DIAGNOSIS — M47817 Spondylosis without myelopathy or radiculopathy, lumbosacral region: Secondary | ICD-10-CM | POA: Diagnosis not present

## 2014-03-29 DIAGNOSIS — M5137 Other intervertebral disc degeneration, lumbosacral region: Secondary | ICD-10-CM | POA: Diagnosis not present

## 2014-03-29 DIAGNOSIS — M48062 Spinal stenosis, lumbar region with neurogenic claudication: Secondary | ICD-10-CM | POA: Diagnosis not present

## 2014-06-04 DIAGNOSIS — I10 Essential (primary) hypertension: Secondary | ICD-10-CM | POA: Diagnosis not present

## 2014-06-04 DIAGNOSIS — E785 Hyperlipidemia, unspecified: Secondary | ICD-10-CM | POA: Diagnosis not present

## 2014-06-04 DIAGNOSIS — E039 Hypothyroidism, unspecified: Secondary | ICD-10-CM | POA: Diagnosis not present

## 2014-06-07 DIAGNOSIS — K219 Gastro-esophageal reflux disease without esophagitis: Secondary | ICD-10-CM | POA: Diagnosis not present

## 2014-06-07 DIAGNOSIS — M79641 Pain in right hand: Secondary | ICD-10-CM | POA: Diagnosis not present

## 2014-06-07 DIAGNOSIS — Z23 Encounter for immunization: Secondary | ICD-10-CM | POA: Diagnosis not present

## 2014-06-07 DIAGNOSIS — R05 Cough: Secondary | ICD-10-CM | POA: Diagnosis not present

## 2014-06-07 DIAGNOSIS — Z0000 Encounter for general adult medical examination without abnormal findings: Secondary | ICD-10-CM | POA: Diagnosis not present

## 2014-06-07 DIAGNOSIS — M5416 Radiculopathy, lumbar region: Secondary | ICD-10-CM | POA: Diagnosis not present

## 2014-06-07 DIAGNOSIS — R312 Other microscopic hematuria: Secondary | ICD-10-CM | POA: Diagnosis not present

## 2014-06-07 DIAGNOSIS — Z1389 Encounter for screening for other disorder: Secondary | ICD-10-CM | POA: Diagnosis not present

## 2014-06-07 DIAGNOSIS — E785 Hyperlipidemia, unspecified: Secondary | ICD-10-CM | POA: Diagnosis not present

## 2014-06-07 DIAGNOSIS — I10 Essential (primary) hypertension: Secondary | ICD-10-CM | POA: Diagnosis not present

## 2014-06-15 DIAGNOSIS — Z1212 Encounter for screening for malignant neoplasm of rectum: Secondary | ICD-10-CM | POA: Diagnosis not present

## 2014-08-17 DIAGNOSIS — D123 Benign neoplasm of transverse colon: Secondary | ICD-10-CM | POA: Diagnosis not present

## 2014-08-17 DIAGNOSIS — D128 Benign neoplasm of rectum: Secondary | ICD-10-CM | POA: Diagnosis not present

## 2014-08-17 DIAGNOSIS — Z1211 Encounter for screening for malignant neoplasm of colon: Secondary | ICD-10-CM | POA: Diagnosis not present

## 2014-08-17 DIAGNOSIS — Z800 Family history of malignant neoplasm of digestive organs: Secondary | ICD-10-CM | POA: Diagnosis not present

## 2014-08-17 DIAGNOSIS — D126 Benign neoplasm of colon, unspecified: Secondary | ICD-10-CM | POA: Diagnosis not present

## 2014-08-17 DIAGNOSIS — D120 Benign neoplasm of cecum: Secondary | ICD-10-CM | POA: Diagnosis not present

## 2014-09-25 DIAGNOSIS — I10 Essential (primary) hypertension: Secondary | ICD-10-CM | POA: Diagnosis not present

## 2014-09-25 DIAGNOSIS — M4316 Spondylolisthesis, lumbar region: Secondary | ICD-10-CM | POA: Diagnosis not present

## 2014-09-25 DIAGNOSIS — M4806 Spinal stenosis, lumbar region: Secondary | ICD-10-CM | POA: Diagnosis not present

## 2014-09-25 DIAGNOSIS — Z6832 Body mass index (BMI) 32.0-32.9, adult: Secondary | ICD-10-CM | POA: Diagnosis not present

## 2014-09-25 DIAGNOSIS — M5136 Other intervertebral disc degeneration, lumbar region: Secondary | ICD-10-CM | POA: Diagnosis not present

## 2014-09-25 DIAGNOSIS — M4726 Other spondylosis with radiculopathy, lumbar region: Secondary | ICD-10-CM | POA: Diagnosis not present

## 2014-10-04 DIAGNOSIS — M4806 Spinal stenosis, lumbar region: Secondary | ICD-10-CM | POA: Diagnosis not present

## 2014-10-04 DIAGNOSIS — M4316 Spondylolisthesis, lumbar region: Secondary | ICD-10-CM | POA: Diagnosis not present

## 2014-10-04 DIAGNOSIS — M5136 Other intervertebral disc degeneration, lumbar region: Secondary | ICD-10-CM | POA: Diagnosis not present

## 2014-10-04 DIAGNOSIS — M47816 Spondylosis without myelopathy or radiculopathy, lumbar region: Secondary | ICD-10-CM | POA: Diagnosis not present

## 2014-10-09 DIAGNOSIS — Z780 Asymptomatic menopausal state: Secondary | ICD-10-CM | POA: Diagnosis not present

## 2014-10-10 DIAGNOSIS — E039 Hypothyroidism, unspecified: Secondary | ICD-10-CM | POA: Diagnosis not present

## 2014-10-10 DIAGNOSIS — I10 Essential (primary) hypertension: Secondary | ICD-10-CM | POA: Diagnosis not present

## 2014-10-10 DIAGNOSIS — Z6833 Body mass index (BMI) 33.0-33.9, adult: Secondary | ICD-10-CM | POA: Diagnosis not present

## 2015-01-24 DIAGNOSIS — M5136 Other intervertebral disc degeneration, lumbar region: Secondary | ICD-10-CM | POA: Diagnosis not present

## 2015-01-24 DIAGNOSIS — M4806 Spinal stenosis, lumbar region: Secondary | ICD-10-CM | POA: Diagnosis not present

## 2015-01-24 DIAGNOSIS — M47816 Spondylosis without myelopathy or radiculopathy, lumbar region: Secondary | ICD-10-CM | POA: Diagnosis not present

## 2015-01-24 DIAGNOSIS — M4186 Other forms of scoliosis, lumbar region: Secondary | ICD-10-CM | POA: Diagnosis not present

## 2015-03-26 DIAGNOSIS — M5136 Other intervertebral disc degeneration, lumbar region: Secondary | ICD-10-CM | POA: Diagnosis not present

## 2015-03-26 DIAGNOSIS — M4726 Other spondylosis with radiculopathy, lumbar region: Secondary | ICD-10-CM | POA: Diagnosis not present

## 2015-03-26 DIAGNOSIS — Z6832 Body mass index (BMI) 32.0-32.9, adult: Secondary | ICD-10-CM | POA: Diagnosis not present

## 2015-03-26 DIAGNOSIS — M4806 Spinal stenosis, lumbar region: Secondary | ICD-10-CM | POA: Diagnosis not present

## 2015-03-26 DIAGNOSIS — M4316 Spondylolisthesis, lumbar region: Secondary | ICD-10-CM | POA: Diagnosis not present

## 2015-03-26 DIAGNOSIS — I10 Essential (primary) hypertension: Secondary | ICD-10-CM | POA: Diagnosis not present

## 2015-03-29 DIAGNOSIS — Z803 Family history of malignant neoplasm of breast: Secondary | ICD-10-CM | POA: Diagnosis not present

## 2015-03-29 DIAGNOSIS — Z1231 Encounter for screening mammogram for malignant neoplasm of breast: Secondary | ICD-10-CM | POA: Diagnosis not present

## 2015-04-11 DIAGNOSIS — M5136 Other intervertebral disc degeneration, lumbar region: Secondary | ICD-10-CM | POA: Diagnosis not present

## 2015-04-11 DIAGNOSIS — M4806 Spinal stenosis, lumbar region: Secondary | ICD-10-CM | POA: Diagnosis not present

## 2015-04-11 DIAGNOSIS — M47816 Spondylosis without myelopathy or radiculopathy, lumbar region: Secondary | ICD-10-CM | POA: Diagnosis not present

## 2015-06-06 DIAGNOSIS — E785 Hyperlipidemia, unspecified: Secondary | ICD-10-CM | POA: Diagnosis not present

## 2015-06-06 DIAGNOSIS — N390 Urinary tract infection, site not specified: Secondary | ICD-10-CM | POA: Diagnosis not present

## 2015-06-06 DIAGNOSIS — E039 Hypothyroidism, unspecified: Secondary | ICD-10-CM | POA: Diagnosis not present

## 2015-06-06 DIAGNOSIS — I10 Essential (primary) hypertension: Secondary | ICD-10-CM | POA: Diagnosis not present

## 2015-06-06 DIAGNOSIS — R8299 Other abnormal findings in urine: Secondary | ICD-10-CM | POA: Diagnosis not present

## 2015-06-12 DIAGNOSIS — E784 Other hyperlipidemia: Secondary | ICD-10-CM | POA: Diagnosis not present

## 2015-06-12 DIAGNOSIS — M5416 Radiculopathy, lumbar region: Secondary | ICD-10-CM | POA: Diagnosis not present

## 2015-06-12 DIAGNOSIS — Z1389 Encounter for screening for other disorder: Secondary | ICD-10-CM | POA: Diagnosis not present

## 2015-06-12 DIAGNOSIS — Z0000 Encounter for general adult medical examination without abnormal findings: Secondary | ICD-10-CM | POA: Diagnosis not present

## 2015-06-12 DIAGNOSIS — K449 Diaphragmatic hernia without obstruction or gangrene: Secondary | ICD-10-CM | POA: Diagnosis not present

## 2015-06-12 DIAGNOSIS — Z6834 Body mass index (BMI) 34.0-34.9, adult: Secondary | ICD-10-CM | POA: Diagnosis not present

## 2015-06-12 DIAGNOSIS — E039 Hypothyroidism, unspecified: Secondary | ICD-10-CM | POA: Diagnosis not present

## 2015-06-12 DIAGNOSIS — I10 Essential (primary) hypertension: Secondary | ICD-10-CM | POA: Diagnosis not present

## 2015-06-12 DIAGNOSIS — Z23 Encounter for immunization: Secondary | ICD-10-CM | POA: Diagnosis not present

## 2015-07-23 DIAGNOSIS — E038 Other specified hypothyroidism: Secondary | ICD-10-CM | POA: Diagnosis not present

## 2015-09-11 DIAGNOSIS — E038 Other specified hypothyroidism: Secondary | ICD-10-CM | POA: Diagnosis not present

## 2015-09-24 DIAGNOSIS — M4806 Spinal stenosis, lumbar region: Secondary | ICD-10-CM | POA: Diagnosis not present

## 2015-09-24 DIAGNOSIS — M5136 Other intervertebral disc degeneration, lumbar region: Secondary | ICD-10-CM | POA: Diagnosis not present

## 2015-09-24 DIAGNOSIS — M4726 Other spondylosis with radiculopathy, lumbar region: Secondary | ICD-10-CM | POA: Diagnosis not present

## 2015-09-24 DIAGNOSIS — M4316 Spondylolisthesis, lumbar region: Secondary | ICD-10-CM | POA: Diagnosis not present

## 2015-09-24 DIAGNOSIS — M5416 Radiculopathy, lumbar region: Secondary | ICD-10-CM | POA: Diagnosis not present

## 2015-09-24 DIAGNOSIS — Z6831 Body mass index (BMI) 31.0-31.9, adult: Secondary | ICD-10-CM | POA: Diagnosis not present

## 2015-09-27 DIAGNOSIS — M5126 Other intervertebral disc displacement, lumbar region: Secondary | ICD-10-CM | POA: Diagnosis not present

## 2015-09-27 DIAGNOSIS — M4806 Spinal stenosis, lumbar region: Secondary | ICD-10-CM | POA: Diagnosis not present

## 2015-10-02 DIAGNOSIS — M5416 Radiculopathy, lumbar region: Secondary | ICD-10-CM | POA: Diagnosis not present

## 2015-10-02 DIAGNOSIS — M4806 Spinal stenosis, lumbar region: Secondary | ICD-10-CM | POA: Diagnosis not present

## 2015-10-02 DIAGNOSIS — M4316 Spondylolisthesis, lumbar region: Secondary | ICD-10-CM | POA: Diagnosis not present

## 2015-10-02 DIAGNOSIS — Z6831 Body mass index (BMI) 31.0-31.9, adult: Secondary | ICD-10-CM | POA: Diagnosis not present

## 2015-10-02 DIAGNOSIS — M4726 Other spondylosis with radiculopathy, lumbar region: Secondary | ICD-10-CM | POA: Diagnosis not present

## 2015-10-02 DIAGNOSIS — M5136 Other intervertebral disc degeneration, lumbar region: Secondary | ICD-10-CM | POA: Diagnosis not present

## 2015-10-02 DIAGNOSIS — M545 Low back pain: Secondary | ICD-10-CM | POA: Diagnosis not present

## 2015-10-17 DIAGNOSIS — M4726 Other spondylosis with radiculopathy, lumbar region: Secondary | ICD-10-CM | POA: Diagnosis not present

## 2015-10-17 DIAGNOSIS — M4806 Spinal stenosis, lumbar region: Secondary | ICD-10-CM | POA: Diagnosis not present

## 2015-10-17 DIAGNOSIS — M5136 Other intervertebral disc degeneration, lumbar region: Secondary | ICD-10-CM | POA: Diagnosis not present

## 2015-12-17 DIAGNOSIS — E038 Other specified hypothyroidism: Secondary | ICD-10-CM | POA: Diagnosis not present

## 2015-12-31 DIAGNOSIS — M4806 Spinal stenosis, lumbar region: Secondary | ICD-10-CM | POA: Diagnosis not present

## 2015-12-31 DIAGNOSIS — M5416 Radiculopathy, lumbar region: Secondary | ICD-10-CM | POA: Diagnosis not present

## 2015-12-31 DIAGNOSIS — M4726 Other spondylosis with radiculopathy, lumbar region: Secondary | ICD-10-CM | POA: Diagnosis not present

## 2015-12-31 DIAGNOSIS — I10 Essential (primary) hypertension: Secondary | ICD-10-CM | POA: Diagnosis not present

## 2015-12-31 DIAGNOSIS — M5136 Other intervertebral disc degeneration, lumbar region: Secondary | ICD-10-CM | POA: Diagnosis not present

## 2016-04-02 DIAGNOSIS — M25519 Pain in unspecified shoulder: Secondary | ICD-10-CM | POA: Diagnosis not present

## 2016-04-02 DIAGNOSIS — I10 Essential (primary) hypertension: Secondary | ICD-10-CM | POA: Diagnosis not present

## 2016-04-02 DIAGNOSIS — Z23 Encounter for immunization: Secondary | ICD-10-CM | POA: Diagnosis not present

## 2016-04-02 DIAGNOSIS — Z1389 Encounter for screening for other disorder: Secondary | ICD-10-CM | POA: Diagnosis not present

## 2016-04-14 DIAGNOSIS — M25511 Pain in right shoulder: Secondary | ICD-10-CM | POA: Diagnosis not present

## 2016-04-14 DIAGNOSIS — M6281 Muscle weakness (generalized): Secondary | ICD-10-CM | POA: Diagnosis not present

## 2016-06-18 DIAGNOSIS — E038 Other specified hypothyroidism: Secondary | ICD-10-CM | POA: Diagnosis not present

## 2016-06-18 DIAGNOSIS — E784 Other hyperlipidemia: Secondary | ICD-10-CM | POA: Diagnosis not present

## 2016-06-18 DIAGNOSIS — Z0000 Encounter for general adult medical examination without abnormal findings: Secondary | ICD-10-CM | POA: Diagnosis not present

## 2016-06-18 DIAGNOSIS — I10 Essential (primary) hypertension: Secondary | ICD-10-CM | POA: Diagnosis not present

## 2016-06-18 DIAGNOSIS — N390 Urinary tract infection, site not specified: Secondary | ICD-10-CM | POA: Diagnosis not present

## 2016-06-18 DIAGNOSIS — R8299 Other abnormal findings in urine: Secondary | ICD-10-CM | POA: Diagnosis not present

## 2016-06-25 DIAGNOSIS — Z0000 Encounter for general adult medical examination without abnormal findings: Secondary | ICD-10-CM | POA: Diagnosis not present

## 2016-06-25 DIAGNOSIS — E784 Other hyperlipidemia: Secondary | ICD-10-CM | POA: Diagnosis not present

## 2016-06-25 DIAGNOSIS — I10 Essential (primary) hypertension: Secondary | ICD-10-CM | POA: Diagnosis not present

## 2016-06-25 DIAGNOSIS — M79641 Pain in right hand: Secondary | ICD-10-CM | POA: Diagnosis not present

## 2016-06-25 DIAGNOSIS — E038 Other specified hypothyroidism: Secondary | ICD-10-CM | POA: Diagnosis not present

## 2016-06-25 DIAGNOSIS — Z6831 Body mass index (BMI) 31.0-31.9, adult: Secondary | ICD-10-CM | POA: Diagnosis not present

## 2016-06-25 DIAGNOSIS — M25511 Pain in right shoulder: Secondary | ICD-10-CM | POA: Diagnosis not present

## 2016-06-30 DIAGNOSIS — M4726 Other spondylosis with radiculopathy, lumbar region: Secondary | ICD-10-CM | POA: Diagnosis not present

## 2016-06-30 DIAGNOSIS — M48062 Spinal stenosis, lumbar region with neurogenic claudication: Secondary | ICD-10-CM | POA: Diagnosis not present

## 2016-06-30 DIAGNOSIS — I10 Essential (primary) hypertension: Secondary | ICD-10-CM | POA: Diagnosis not present

## 2016-06-30 DIAGNOSIS — M5136 Other intervertebral disc degeneration, lumbar region: Secondary | ICD-10-CM | POA: Diagnosis not present

## 2016-06-30 DIAGNOSIS — M5416 Radiculopathy, lumbar region: Secondary | ICD-10-CM | POA: Diagnosis not present

## 2016-06-30 DIAGNOSIS — Z6830 Body mass index (BMI) 30.0-30.9, adult: Secondary | ICD-10-CM | POA: Diagnosis not present

## 2016-08-25 DIAGNOSIS — Z1389 Encounter for screening for other disorder: Secondary | ICD-10-CM | POA: Diagnosis not present

## 2016-08-25 DIAGNOSIS — I10 Essential (primary) hypertension: Secondary | ICD-10-CM | POA: Diagnosis not present

## 2016-08-25 DIAGNOSIS — Z6830 Body mass index (BMI) 30.0-30.9, adult: Secondary | ICD-10-CM | POA: Diagnosis not present

## 2016-08-25 DIAGNOSIS — M79641 Pain in right hand: Secondary | ICD-10-CM | POA: Diagnosis not present

## 2016-08-25 DIAGNOSIS — E038 Other specified hypothyroidism: Secondary | ICD-10-CM | POA: Diagnosis not present

## 2017-01-26 DIAGNOSIS — J209 Acute bronchitis, unspecified: Secondary | ICD-10-CM | POA: Diagnosis not present

## 2017-01-26 DIAGNOSIS — R05 Cough: Secondary | ICD-10-CM | POA: Diagnosis not present

## 2017-01-26 DIAGNOSIS — R062 Wheezing: Secondary | ICD-10-CM | POA: Diagnosis not present

## 2017-01-26 DIAGNOSIS — Z6832 Body mass index (BMI) 32.0-32.9, adult: Secondary | ICD-10-CM | POA: Diagnosis not present

## 2017-01-26 DIAGNOSIS — I10 Essential (primary) hypertension: Secondary | ICD-10-CM | POA: Diagnosis not present

## 2017-07-20 HISTORY — PX: OTHER SURGICAL HISTORY: SHX169

## 2017-11-04 DIAGNOSIS — R2681 Unsteadiness on feet: Secondary | ICD-10-CM | POA: Diagnosis not present

## 2017-11-04 DIAGNOSIS — E038 Other specified hypothyroidism: Secondary | ICD-10-CM | POA: Diagnosis not present

## 2017-11-04 DIAGNOSIS — F0390 Unspecified dementia without behavioral disturbance: Secondary | ICD-10-CM | POA: Diagnosis not present

## 2017-11-04 DIAGNOSIS — Z1389 Encounter for screening for other disorder: Secondary | ICD-10-CM | POA: Diagnosis not present

## 2017-11-04 DIAGNOSIS — Z6833 Body mass index (BMI) 33.0-33.9, adult: Secondary | ICD-10-CM | POA: Diagnosis not present

## 2017-11-04 DIAGNOSIS — R5383 Other fatigue: Secondary | ICD-10-CM | POA: Diagnosis not present

## 2017-11-04 DIAGNOSIS — R05 Cough: Secondary | ICD-10-CM | POA: Diagnosis not present

## 2017-11-08 ENCOUNTER — Other Ambulatory Visit: Payer: Self-pay | Admitting: Internal Medicine

## 2017-11-08 DIAGNOSIS — R2681 Unsteadiness on feet: Secondary | ICD-10-CM

## 2017-11-15 ENCOUNTER — Ambulatory Visit
Admission: RE | Admit: 2017-11-15 | Discharge: 2017-11-15 | Disposition: A | Discharge status: Home / Self Care | Payer: Medicare Other | Source: Ambulatory Visit | Attending: Internal Medicine | Admitting: Internal Medicine

## 2017-11-15 DIAGNOSIS — R2681 Unsteadiness on feet: Secondary | ICD-10-CM

## 2017-11-16 DIAGNOSIS — E038 Other specified hypothyroidism: Secondary | ICD-10-CM | POA: Diagnosis not present

## 2017-11-16 DIAGNOSIS — R82998 Other abnormal findings in urine: Secondary | ICD-10-CM | POA: Diagnosis not present

## 2017-11-16 DIAGNOSIS — E7849 Other hyperlipidemia: Secondary | ICD-10-CM | POA: Diagnosis not present

## 2017-11-16 DIAGNOSIS — I10 Essential (primary) hypertension: Secondary | ICD-10-CM | POA: Diagnosis not present

## 2017-11-23 DIAGNOSIS — E7849 Other hyperlipidemia: Secondary | ICD-10-CM | POA: Diagnosis not present

## 2017-11-23 DIAGNOSIS — K219 Gastro-esophageal reflux disease without esophagitis: Secondary | ICD-10-CM | POA: Diagnosis not present

## 2017-11-23 DIAGNOSIS — E038 Other specified hypothyroidism: Secondary | ICD-10-CM | POA: Diagnosis not present

## 2017-11-23 DIAGNOSIS — L988 Other specified disorders of the skin and subcutaneous tissue: Secondary | ICD-10-CM | POA: Diagnosis not present

## 2017-11-23 DIAGNOSIS — Z6833 Body mass index (BMI) 33.0-33.9, adult: Secondary | ICD-10-CM | POA: Diagnosis not present

## 2017-11-23 DIAGNOSIS — Z1389 Encounter for screening for other disorder: Secondary | ICD-10-CM | POA: Diagnosis not present

## 2017-11-23 DIAGNOSIS — R2681 Unsteadiness on feet: Secondary | ICD-10-CM | POA: Diagnosis not present

## 2017-11-23 DIAGNOSIS — F0390 Unspecified dementia without behavioral disturbance: Secondary | ICD-10-CM | POA: Diagnosis not present

## 2017-11-23 DIAGNOSIS — Z0000 Encounter for general adult medical examination without abnormal findings: Secondary | ICD-10-CM | POA: Diagnosis not present

## 2017-11-23 DIAGNOSIS — I10 Essential (primary) hypertension: Secondary | ICD-10-CM | POA: Diagnosis not present

## 2017-11-23 DIAGNOSIS — R490 Dysphonia: Secondary | ICD-10-CM | POA: Diagnosis not present

## 2017-11-23 DIAGNOSIS — M5416 Radiculopathy, lumbar region: Secondary | ICD-10-CM | POA: Diagnosis not present

## 2017-11-30 DIAGNOSIS — D485 Neoplasm of uncertain behavior of skin: Secondary | ICD-10-CM | POA: Diagnosis not present

## 2017-11-30 DIAGNOSIS — D234 Other benign neoplasm of skin of scalp and neck: Secondary | ICD-10-CM | POA: Diagnosis not present

## 2017-12-26 ENCOUNTER — Encounter (HOSPITAL_COMMUNITY): Payer: Self-pay | Admitting: Emergency Medicine

## 2017-12-26 ENCOUNTER — Emergency Department (HOSPITAL_COMMUNITY)
Admission: EM | Admit: 2017-12-26 | Discharge: 2017-12-26 | Disposition: A | Discharge status: Home / Self Care | Payer: Medicare Other | Attending: Emergency Medicine | Admitting: Emergency Medicine

## 2017-12-26 ENCOUNTER — Other Ambulatory Visit: Payer: Self-pay

## 2017-12-26 DIAGNOSIS — R443 Hallucinations, unspecified: Secondary | ICD-10-CM

## 2017-12-26 DIAGNOSIS — I10 Essential (primary) hypertension: Secondary | ICD-10-CM | POA: Diagnosis not present

## 2017-12-26 DIAGNOSIS — F0390 Unspecified dementia without behavioral disturbance: Secondary | ICD-10-CM | POA: Diagnosis not present

## 2017-12-26 HISTORY — DX: Unspecified dementia, unspecified severity, without behavioral disturbance, psychotic disturbance, mood disturbance, and anxiety: F03.90

## 2017-12-26 HISTORY — DX: Disorder of thyroid, unspecified: E07.9

## 2017-12-26 LAB — COMPREHENSIVE METABOLIC PANEL
ALBUMIN: 3.8 g/dL (ref 3.5–5.0)
ALT: 17 U/L (ref 14–54)
ANION GAP: 8 (ref 5–15)
AST: 48 U/L — ABNORMAL HIGH (ref 15–41)
Alkaline Phosphatase: 82 U/L (ref 38–126)
BUN: 11 mg/dL (ref 6–20)
CO2: 25 mmol/L (ref 22–32)
Calcium: 8.8 mg/dL — ABNORMAL LOW (ref 8.9–10.3)
Chloride: 105 mmol/L (ref 101–111)
Creatinine, Ser: 0.93 mg/dL (ref 0.44–1.00)
GFR calc Af Amer: >60 mL/min (ref >60)
GFR calc non Af Amer: 56 mL/min — ABNORMAL LOW (ref >60)
GLUCOSE: 89 mg/dL (ref 65–99)
POTASSIUM: 5.3 mmol/L — AB (ref 3.5–5.1)
Sodium: 138 mmol/L (ref 135–145)
Total Bilirubin: 2 mg/dL — ABNORMAL HIGH (ref 0.3–1.2)
Total Protein: 6.3 g/dL — ABNORMAL LOW (ref 6.5–8.1)

## 2017-12-26 LAB — CBC WITH DIFFERENTIAL/PLATELET
ABS IMMATURE GRANULOCYTES: 0 10*3/uL (ref 0.0–0.1)
BASOS ABS: 0 10*3/uL (ref 0.0–0.1)
Basophils Relative: 1 %
Eosinophils Absolute: 0.1 10*3/uL (ref 0.0–0.7)
Eosinophils Relative: 2 %
HCT: 39.4 % (ref 36.0–46.0)
HEMOGLOBIN: 12.9 g/dL (ref 12.0–15.0)
IMMATURE GRANULOCYTES: 0 %
LYMPHS PCT: 28 %
Lymphs Abs: 1.9 10*3/uL (ref 0.7–4.0)
MCH: 29.7 pg (ref 26.0–34.0)
MCHC: 32.7 g/dL (ref 30.0–36.0)
MCV: 90.6 fL (ref 78.0–100.0)
MONO ABS: 0.5 10*3/uL (ref 0.1–1.0)
MONOS PCT: 8 %
NEUTROS ABS: 4 10*3/uL (ref 1.7–7.7)
NEUTROS PCT: 61 %
Platelets: 223 10*3/uL (ref 150–400)
RBC: 4.35 MIL/uL (ref 3.87–5.11)
RDW: 13 % (ref 11.5–15.5)
WBC: 6.6 10*3/uL (ref 4.0–10.5)

## 2017-12-26 LAB — URINALYSIS, ROUTINE W REFLEX MICROSCOPIC
BILIRUBIN URINE: NEGATIVE
Glucose, UA: NEGATIVE mg/dL
KETONES UR: NEGATIVE mg/dL
Nitrite: NEGATIVE
Protein, ur: NEGATIVE mg/dL
SPECIFIC GRAVITY, URINE: 1.004 — AB (ref 1.005–1.030)
pH: 6 (ref 5.0–8.0)

## 2017-12-26 LAB — TSH: TSH: 2.698 u[IU]/mL (ref 0.350–4.500)

## 2017-12-26 NOTE — ED Triage Notes (Signed)
Husband/daughter stated, shes had delusions for 6 months and they have gotten worse in the  Last 6 months. This morning she thinks 2 people are in the house.

## 2017-12-26 NOTE — ED Notes (Signed)
Patient gone before discharge or possible review by EDP.

## 2017-12-26 NOTE — ED Notes (Signed)
Daughter stated, shes been under a lot of stress granddaughter upstairs and grandson in jail.

## 2017-12-26 NOTE — ED Provider Notes (Signed)
[facility] EMERGENCY DEPARTMENT Provider Note   CSN: [id number] Arrival date & time: 12/26/17  0901   5 caveat dementia  History   Chief Complaint Chief Complaint  Patient presents with  . Hallucinations    HPI  WUNSCHEL is a 83 y.o. female.  She is obtained from patient, daughter and husband.  HPI Patient has been hearing voices for the past 6 months to 2 years.  Last night she felt that they were people in her home that were talking about her she states that she saw them but could not describe them.  She does feel depressed over recent illnesses and injuries of family members.  She denies wanting to harm herself or others.  Her PMD Dr. Sharlett Iles is aware of her hallucinations and has arranged for follow-up with neurology.  She had a head CT scan November 16, 2017 showing no acute abnormality no treatment prior to coming Past Medical History:  Diagnosis Date  . Dementia   . Thyroid disease     Patient Active Problem List   Diagnosis Date Noted  . HYPERLIPIDEMIA 07/11/2010  . HYPERTENSION 07/11/2010  . DYSPNEA 07/11/2010  . COUGH 07/11/2010    History reviewed. No pertinent surgical history.   OB History   None      Home Medications    Prior to Admission medications   Not on File    Family History No family history on file.  Social History Social History   Tobacco Use  . Smoking status: Never Smoker  . Smokeless tobacco: Never Used  Substance Use Topics  . Alcohol use: Not Currently  . Drug use: Not Currently     Allergies   Penicillins   Review of Systems Review of Systems  Unable to perform ROS: Dementia  Psychiatric/Behavioral: Positive for hallucinations.     Physical Exam Updated Vital Signs BP (!) 164/84 (BP Location: Right Arm)   Pulse 90   Temp 98.2 F (36.8 C) (Oral)   Resp 17   SpO2 94%   Physical Exam  Constitutional: She appears well-developed and well-nourished. No distress.  HENT:  Head:  Normocephalic and atraumatic.  Eyes: Pupils are equal, round, and reactive to light. Conjunctivae are normal.  Neck: Neck supple. No tracheal deviation present. No thyromegaly present.  Cardiovascular: Normal rate and regular rhythm.  No murmur heard. Pulmonary/Chest: Effort normal and breath sounds normal.  Abdominal: Soft. Bowel sounds are normal. She exhibits no distension. There is no tenderness.  Musculoskeletal: Normal range of motion. She exhibits no edema or tenderness.  Neurological: She is alert. Coordination normal.  Oriented to name and hospital does not know date.  Cranial nerves II through XII grossly intact.  Gait normal Romberg normal pronator drift normal finger-to-nose normal  Skin: Skin is warm and dry. No rash noted.  Psychiatric: She has a normal mood and affect.  Nursing note and vitals reviewed.    ED Treatments / Results  Labs (all labs ordered are listed, but only abnormal results are displayed) Labs Reviewed  COMPREHENSIVE METABOLIC PANEL  CBC WITH DIFFERENTIAL/PLATELET  URINALYSIS, ROUTINE W REFLEX MICROSCOPIC  AMMONIA  TSH    EKG None  Radiology No results found.  Procedures Procedures (including critical care time)  Medications Ordered in ED Medications - No data to display   Initial Impression / Assessment and Plan / ED Course  I have reviewed the triage vital signs and the nursing notes.  Pertinent labs & imaging results that were available during  my care of the patient were reviewed by me and considered in my medical decision making (see chart for details).    3:45 PM patient is alert pleasant cooperative ambulates without difficulty.  I asked husband and daughter repeatedly they feel that they are able to manage her at home.  Lab work is reassuring.  Remarkable for mild hyperbilirubinemia otherwise normal.  Patient left with family prior to receiving written instructions and prior to receiving repeat vital signs.  Patient advised to  come back to the emergency department if they cannot manage her home.  I patient feel the patient probably needs geriatric psychiatric evaluation.  He is safe as long as she is supervised.  They state that they are able to supervise around around-the-clock  Final Clinical Impressions(s) / ED Diagnoses  #1 hallucinations #2 hyperbilirubinemia Final diagnoses:  None  #3 elevated blood pressure  ED Discharge Orders    None       Orlie Dakin, MD 12/26/17 1555

## 2017-12-26 NOTE — ED Notes (Addendum)
Pt left without signing discharge papers or allowing [place] to update vital signs and assessments.

## 2017-12-26 NOTE — Discharge Instructions (Addendum)
Keep scheduled appointment with neurologist.  Contact Dr. Philip Aspen or return to the emergency department if you feel that Rachael Carpenter cannot be managed at home

## 2018-01-11 DIAGNOSIS — Z6835 Body mass index (BMI) 35.0-35.9, adult: Secondary | ICD-10-CM | POA: Diagnosis not present

## 2018-01-11 DIAGNOSIS — E039 Hypothyroidism, unspecified: Secondary | ICD-10-CM | POA: Diagnosis not present

## 2018-01-11 DIAGNOSIS — F0390 Unspecified dementia without behavioral disturbance: Secondary | ICD-10-CM | POA: Diagnosis not present

## 2018-01-11 DIAGNOSIS — R443 Hallucinations, unspecified: Secondary | ICD-10-CM | POA: Diagnosis not present

## 2018-01-11 DIAGNOSIS — I10 Essential (primary) hypertension: Secondary | ICD-10-CM | POA: Diagnosis not present

## 2018-02-17 DIAGNOSIS — N179 Acute kidney failure, unspecified: Secondary | ICD-10-CM

## 2018-02-17 DIAGNOSIS — I639 Cerebral infarction, unspecified: Secondary | ICD-10-CM

## 2018-02-17 HISTORY — DX: Cerebral infarction, unspecified: I63.9

## 2018-02-17 HISTORY — DX: Acute kidney failure, unspecified: N17.9

## 2018-02-19 ENCOUNTER — Emergency Department (HOSPITAL_COMMUNITY): Payer: Medicare Other

## 2018-02-19 ENCOUNTER — Encounter (HOSPITAL_COMMUNITY): Payer: Self-pay | Admitting: *Deleted

## 2018-02-19 ENCOUNTER — Emergency Department (HOSPITAL_COMMUNITY)
Admission: EM | Admit: 2018-02-19 | Discharge: 2018-02-20 | Disposition: A | Discharge status: Home / Self Care | Payer: Medicare Other | Attending: Emergency Medicine | Admitting: Emergency Medicine

## 2018-02-19 ENCOUNTER — Other Ambulatory Visit: Payer: Self-pay

## 2018-02-19 DIAGNOSIS — R198 Other specified symptoms and signs involving the digestive system and abdomen: Secondary | ICD-10-CM

## 2018-02-19 DIAGNOSIS — Y999 Unspecified external cause status: Secondary | ICD-10-CM | POA: Insufficient documentation

## 2018-02-19 DIAGNOSIS — Y939 Activity, unspecified: Secondary | ICD-10-CM | POA: Diagnosis not present

## 2018-02-19 DIAGNOSIS — F0390 Unspecified dementia without behavioral disturbance: Secondary | ICD-10-CM | POA: Insufficient documentation

## 2018-02-19 DIAGNOSIS — K228 Other specified diseases of esophagus: Secondary | ICD-10-CM | POA: Diagnosis not present

## 2018-02-19 DIAGNOSIS — R52 Pain, unspecified: Secondary | ICD-10-CM

## 2018-02-19 DIAGNOSIS — T188XXA Foreign body in other parts of alimentary tract, initial encounter: Secondary | ICD-10-CM | POA: Diagnosis present

## 2018-02-19 DIAGNOSIS — Y929 Unspecified place or not applicable: Secondary | ICD-10-CM | POA: Diagnosis not present

## 2018-02-19 DIAGNOSIS — R404 Transient alteration of awareness: Secondary | ICD-10-CM | POA: Diagnosis not present

## 2018-02-19 DIAGNOSIS — X58XXXA Exposure to other specified factors, initial encounter: Secondary | ICD-10-CM | POA: Insufficient documentation

## 2018-02-19 DIAGNOSIS — R5381 Other malaise: Secondary | ICD-10-CM | POA: Diagnosis not present

## 2018-02-19 DIAGNOSIS — R0989 Other specified symptoms and signs involving the circulatory and respiratory systems: Secondary | ICD-10-CM | POA: Diagnosis not present

## 2018-02-19 DIAGNOSIS — T189XXA Foreign body of alimentary tract, part unspecified, initial encounter: Secondary | ICD-10-CM | POA: Diagnosis not present

## 2018-02-19 DIAGNOSIS — J9811 Atelectasis: Secondary | ICD-10-CM | POA: Diagnosis not present

## 2018-02-19 NOTE — ED Notes (Signed)
The pt has bed bugs

## 2018-02-19 NOTE — ED Triage Notes (Signed)
The pt arrived by gems from home  ahe woke up from her nap and she felt like there was something hung in her esophagus  She is missing a partial plate and she thinks she may have swallowed it  No difficulty breathing alert but demented

## 2018-02-20 ENCOUNTER — Emergency Department (HOSPITAL_COMMUNITY): Payer: Medicare Other

## 2018-02-20 DIAGNOSIS — K228 Other specified diseases of esophagus: Secondary | ICD-10-CM | POA: Diagnosis not present

## 2018-02-20 DIAGNOSIS — T189XXA Foreign body of alimentary tract, part unspecified, initial encounter: Secondary | ICD-10-CM | POA: Diagnosis not present

## 2018-02-20 NOTE — Discharge Instructions (Addendum)
You were seen today with concern from foreign body in the esophagus.  There is no evidence of your dentures on x-ray.  Doubt you actually swallowed them.  If you develop any difficulty swallowing or difficulty tolerating fluids or food, you should be reevaluated.

## 2018-02-20 NOTE — ED Notes (Signed)
Pt and family given extended education on bed bug infestation. Daughter at bedside questioned if bites to bilat arms could be bed bug bites, she states her daughter [facility] where she was taking her clothing back and forth from their home to the hospital, her daughter did begin having the same areas appear on her body.  Patients daughter states they have noted seeing bugs in home and pts husband noted to have small blood spots on shirt. Family given education on calling exterminator and washing/drying on high heat.

## 2018-02-20 NOTE — ED Provider Notes (Signed)
[facility] Provider Note   CSN: [id number] Arrival date & time: 02/19/18  7616     History   Chief Complaint Chief Complaint  Patient presents with  . Foreign Body    HPI Rachael Carpenter is a 83 y.o. female.  HPI  This is an 83 year old female with a history of dementia who believes she may have swallowed her lower partial denture.  Patient reports that she fell asleep on the couch and woke up and thought that she had swallowed her denture.  She cannot find her lower denture.  She does have a history of dementia.  Son at the bedside states that she has had poor sleeping over the last 2 to 3 days because of sundowning and some delirium.  They were unable to locate her lower dentures.  Patient initially stated like she felt something was in her throat.  She has not tried to eat anything.  She denies any chest pain, abdominal pain.  She currently does not have any foreign body sensation.  Past Medical History:  Diagnosis Date  . Dementia   . Thyroid disease     Patient Active Problem List   Diagnosis Date Noted  . HYPERLIPIDEMIA 07/11/2010  . HYPERTENSION 07/11/2010  . DYSPNEA 07/11/2010  . COUGH 07/11/2010    History reviewed. No pertinent surgical history.   OB History   None      Home Medications    Prior to Admission medications   Not on File    Family History No family history on file.  Social History Social History   Tobacco Use  . Smoking status: Never Smoker  . Smokeless tobacco: Never Used  Substance Use Topics  . Alcohol use: Not Currently  . Drug use: Not Currently     Allergies   Penicillins   Review of Systems Review of Systems  Constitutional: Negative for fever.  HENT: Negative for trouble swallowing.   Respiratory: Negative for shortness of breath.   Cardiovascular: Negative for chest pain.  Gastrointestinal: Negative for abdominal pain.  Psychiatric/Behavioral: Positive for confusion.    All other systems reviewed and are negative.    Physical Exam Updated Vital Signs BP (!) 153/59   Pulse 70   Temp 97.8 F (36.6 C) (Oral)   Resp 17   Ht 5\' 5"  (1.651 m)   Wt 86.2 kg (190 lb)   SpO2 99%   BMI 31.62 kg/m   Physical Exam  Constitutional:  Elderly, nontoxic-appearing  HENT:  Head: Normocephalic and atraumatic.  Mouth/Throat: Oropharynx is clear and moist.  Neck: Neck supple.  Cardiovascular: Normal rate, regular rhythm and normal heart sounds.  No murmur heard. Pulmonary/Chest: Effort normal and breath sounds normal. No respiratory distress. She has no wheezes.  Abdominal: Soft. Bowel sounds are normal. There is no tenderness.  Musculoskeletal: She exhibits no edema.  Neurological: She is alert.  Oriented x2  Skin: Skin is warm and dry.  Psychiatric: She has a normal mood and affect.  Nursing note and vitals reviewed.    ED Treatments / Results  Labs (all labs ordered are listed, but only abnormal results are displayed) Labs Reviewed - No data to display  EKG None  Radiology Dg Abdomen 1 View  Result Date: 02/20/2018 CLINICAL DATA:  Swallowed partial. EXAM: ABDOMEN - 1 VIEW COMPARISON:  None. FINDINGS: There is no evidence for gaseous bowel dilation to suggest obstruction. Surgical clips right upper quadrant suggest prior cholecystectomy. No radiopaque foreign body over  the abdomen or visualized portion of the pelvis. IMPRESSION: Negative. Electronically Signed   By: Misty Stanley M.D.   On: 02/20/2018 00:52   Dg Chest Port 1 View  Result Date: 02/19/2018 CLINICAL DATA:  Feels like something is hung in her esophagus, pain, missing her partial denture plate EXAM: PORTABLE CHEST 1 VIEW COMPARISON:  Portable exam 1959 hours without priors for comparison FINDINGS: Normal heart size, mediastinal contours, and pulmonary vascularity. Bibasilar atelectasis. Lungs otherwise clear. No infiltrate, pleural effusion or pneumothorax. No acute osseous findings or  radiopaque foreign bodies. IMPRESSION: Bibasilar atelectasis. Electronically Signed   By: Lavonia Dana M.D.   On: 02/19/2018 20:22    Procedures Procedures (including critical care time)  Medications Ordered in ED Medications - No data to display   Initial Impression / Assessment and Plan / ED Course  I have reviewed the triage vital signs and the nursing notes.  Pertinent labs & imaging results that were available during my care of the patient were reviewed by me and considered in my medical decision making (see chart for details).     She presents because she feels like she may have swallowed her lower dentures.  She is overall nontoxic-appearing and her vital signs are reassuring.  ABCs are intact.  She is tolerating her secretions.  Chest x-ray with no evidence of metallic foreign body.  Reports partial lower denture which did have metal on it.  It is unclear whether she actually swallowed this.  They were unable to locate dentures at home.  I did add an abdominal x-ray which additionally does not show any foreign body.  She is able to tolerate fluids without any difficulty.  Plan for discharge home with expectant management.  After history, exam, and medical workup I feel the patient has been appropriately medically screened and is safe for discharge home. Pertinent diagnoses were discussed with the patient. Patient was given return precautions.   Final Clinical Impressions(s) / ED Diagnoses   Final diagnoses:  Sensation of foreign body in esophagus    ED Discharge Orders    None       Horton, Barbette Hair, MD 02/20/18 [phone]

## 2018-02-20 NOTE — ED Notes (Signed)
Patient changed into blue scrubs and she chose to not put on pants for the time being. Patient has been covered with a blanket for privacy. Patient belongings have been double bagged and towels have been placed along the bottom of the door due to bedbugs on patients.

## 2018-02-20 NOTE — ED Notes (Signed)
Patient given a diet coke for fluid challenge.

## 2018-02-21 DIAGNOSIS — F0390 Unspecified dementia without behavioral disturbance: Secondary | ICD-10-CM | POA: Diagnosis not present

## 2018-02-21 DIAGNOSIS — Z6834 Body mass index (BMI) 34.0-34.9, adult: Secondary | ICD-10-CM | POA: Diagnosis not present

## 2018-02-21 DIAGNOSIS — R443 Hallucinations, unspecified: Secondary | ICD-10-CM | POA: Diagnosis not present

## 2018-02-21 DIAGNOSIS — E038 Other specified hypothyroidism: Secondary | ICD-10-CM | POA: Diagnosis not present

## 2018-02-21 DIAGNOSIS — R2681 Unsteadiness on feet: Secondary | ICD-10-CM | POA: Diagnosis not present

## 2018-02-24 ENCOUNTER — Telehealth: Payer: Self-pay | Admitting: Neurology

## 2018-02-24 ENCOUNTER — Encounter: Payer: Self-pay | Admitting: Neurology

## 2018-02-24 ENCOUNTER — Ambulatory Visit (INDEPENDENT_AMBULATORY_CARE_PROVIDER_SITE_OTHER): Payer: Medicare Other | Admitting: Neurology

## 2018-02-24 VITALS — BP 107/63 | HR 87 | Ht 65.0 in | Wt 200.0 lb

## 2018-02-24 DIAGNOSIS — F0391 Unspecified dementia with behavioral disturbance: Secondary | ICD-10-CM

## 2018-02-24 DIAGNOSIS — N390 Urinary tract infection, site not specified: Secondary | ICD-10-CM | POA: Diagnosis not present

## 2018-02-24 DIAGNOSIS — E538 Deficiency of other specified B group vitamins: Secondary | ICD-10-CM | POA: Diagnosis not present

## 2018-02-24 DIAGNOSIS — R410 Disorientation, unspecified: Secondary | ICD-10-CM | POA: Diagnosis not present

## 2018-02-24 DIAGNOSIS — R443 Hallucinations, unspecified: Secondary | ICD-10-CM | POA: Diagnosis not present

## 2018-02-24 DIAGNOSIS — G20 Parkinson's disease: Secondary | ICD-10-CM | POA: Diagnosis not present

## 2018-02-24 MED ORDER — DONEPEZIL HCL 10 MG PO TABS: 10.0000 mg | ORAL_TABLET | Freq: Every day | ORAL | 11 refills | Status: AC

## 2018-02-24 NOTE — Telephone Encounter (Signed)
Medicare/bcbs supp order sent to GI. No auth they will reach out to the pt to schedule.  °

## 2018-02-24 NOTE — Progress Notes (Signed)
[facility]    Provider:  Dr Jaynee Eagles Referring Provider: [name], MD Primary Care Physician:  [name], MD  CC:  forgetfullness  Addendum 8/12/219: Patient presented with an unusual pattern of worsening hallucintations without significant memory loss, rapidly progressive for last several weeks. Concerning labs with  1. UTI.  2. B12 deficiency and  3. Very high thyroid antibodies that may possibly be Hashimoto's Encephalitis.   Discussed with husband:  1. Start Bactrim DS and wait for sensitivities, called it in to pharmacy 2. She should take 2000 B12 oral daily this weekend but next week need to start on B12 injections Monday 3. Hashimoto's encephalitis or encephalopathy is a rare autoimmune disease. It can present as rapidly progressive dementia which is treatable with high dose steroids. Will discuss Monday with infusion suite for a course of high-dose IV steroids which we can start Monday hopefully for several days and then an oral taper.   Thyroperoxidase antibodies 192 (normal 0-34) Thyroglobulin antibodies 91 (normal 0-0.9)   HPI:  Rachael Carpenter is a 83 y.o. female here as a referral from Dr. Philip Aspen for memory problems.  Past medical history hypothyroidism, cough, overweight, hyperlipidemia, intestinal malrotation, HTN, HLD. Husband provides most information. She has been having hallucinations, elderly lady and her husband visit every night, also seeing other things every day she hears her ex son-in-law talking in her house and tells husband what they are saying. No significant vision problems. Patient has poor insight into her symptoms, she is not sure she has memory loss maybe a little. Been ongoing for a year or longer. Brother and sister with dementia, unknown which kind. A little short term memory loss but husband doesn't think it is significant. When she hears voices in her house she talks in a whisper. Appears Hallucinations started  with confusion when he says the symptoms started, but forgetting appointments, she is good at putting reminders on the fridge, she pays the bills and doesn't miss any, she takes her own medications and appears she takes her meds per husband. The house is not as clean like she used to, not cooling as much as she used to, less social. She has lost several close family members and sisters and grand daughter had a drug program and she was living with patient and this is when decline occurred. She denies falls or imbalance, REM sleep disorder but husband does say she wanders at night. She is on Seroquel.   Reviewed notes, labs and imaging from outside physicians, which showed:  CT head 10/2017 reviewed images and agree with following: Atrophy and chronic microvascular ischemia.  No acute abnormality.  Mother lived until the age of 86, she has 37 sisters and 3 brothers, 3 daughters, 5 grandchildren no mention of dementia in the note her family history.  Reviewed physical exam which was unremarkable essentially normal except some osteoarthritis in multiple IP joints especially the right middle finger foot exam was normal, appears to be compliant, never smoker, forgetfulness.  It does state he is at forgetfulness most likely from early Alzheimer's disease, could be from slight hypothyroidism and or less likely from vitamin B12 deficiency but they checked a B12 level and she is on medication for her hypothyroidism.  Review of Systems: Patient complains of symptoms per HPI as well as the following symptoms: memory loss, hallucinations. Pertinent negatives and positives per HPI. All others negative.   Social History   Socioeconomic History  . Marital status: Married    Spouse name:  Not on file  . Number of children: 3  . 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 Highest education level: High school graduate  Occupational History  . Not on file  Social Needs  . Financial resource strain:  Not on file  . Food insecurity:    Worry: Not on file    Inability: Not on file  . Transportation needs:    Medical: Not on file    Non-medical: Not on file  Tobacco Use  . Smoking status: Never Smoker  . Smokeless tobacco: Never Used  Substance and Sexual Activity  . Alcohol use: Never    Frequency: Never  . Drug use: Never  . Sexual activity: Not on file  Lifestyle  . Physical activity:    Days per week: Not on file    Minutes per session: Not on file  . Stress: Not on file  Relationships  . Social connections:    Talks on phone: Not on file    Gets together: Not on file    Attends religious service: Not on file    Active member of club or organization: Not on file    Attends meetings of clubs or organizations: Not on file    Relationship status: Not on file  . Intimate partner violence:    Fear of current or ex partner: Not on file    Emotionally abused: Not on file    Physically abused: Not on file    Forced sexual activity: Not on file  Other Topics Concern  . Not on file  Social History Narrative   Lives at home with her husband   Right handed    Family History  Problem Relation Age of Onset  . COPD Sister   . Cancer Daughter   . Cancer Brother   . Cancer Brother   . Dementia Neg Hx     Past Medical History:  Diagnosis Date  . Colon polyp    in cecum on July 2010 Colonoscopy  . Dementia   . Hyperlipidemia   . Hypothyroidism   . Intestinal malrotation    on CT scan 2010 in [place]  . Neck pain    L; pt denies as of 02/24/18  . Thyroid disease     Past Surgical History:  Procedure Laterality Date  . 4th finger flexor sheath ganglion Left 02/2006  . CHOLECYSTECTOMY  1990s  . [place]  . RIGHT FOOT MOLE EXCISION Right 2011  . scalp lesion removal  2019  . SHOULDER RECONSTRUCTION Right 07/2001  . SHOULDER SURGERY Right   . TOTAL ABDOMINAL HYSTERECTOMY     83 y.o.    Current Outpatient Medications  Medication Sig  Dispense Refill  . amLODipine (NORVASC) 2.5 MG tablet Take 2.5 mg by mouth daily.    Marland Kitchen aspirin 81 MG chewable tablet Chew 81 mg by mouth daily.    Marland Kitchen atorvastatin (LIPITOR) 40 MG tablet Take 40 mg by mouth daily.    . Cyanocobalamin (B-12 PO) Take 500 mcg by mouth daily.    Marland Kitchen loratadine (CLARITIN) 10 MG tablet Take 10 mg by mouth daily.    Marland Kitchen losartan (COZAAR) 100 MG tablet Take 100 mg by mouth daily.    . Multiple Minerals-Vitamins (CITRACAL PLUS) TABS Take 1 tablet by mouth daily.    . QUEtiapine (SEROQUEL) 100 MG tablet Take 100 mg by mouth. Every afternoon    . zolpidem (AMBIEN) 10 MG tablet Take 5-10 mg  by mouth at bedtime as needed for sleep.    Marland Kitchen donepezil (ARICEPT) 10 MG tablet Take 1 tablet (10 mg total) by mouth at bedtime. 30 tablet 11  . levothyroxine (SYNTHROID, LEVOTHROID) 200 MCG tablet Take 200 mcg by mouth daily.   2  . sulfamethoxazole-trimethoprim (BACTRIM DS,SEPTRA DS) 800-160 MG tablet Take 1 tablet by mouth 2 (two) times daily. 14 tablet 0   No current facility-administered medications for this visit.     Allergies as of 02/24/2018 - Review Complete 02/24/2018  Allergen Reaction Noted  . Penicillins      Vitals: BP 107/63 (BP Location: Right Arm, Patient Position: Sitting)   Pulse 87   Ht 5\' 5"  (1.651 m)   Wt 200 lb (90.7 kg)   BMI 33.28 kg/m  Last Weight:  Wt Readings from Last 1 Encounters:  02/24/18 200 lb (90.7 kg)   Last Height:   Ht Readings from Last 1 Encounters:  02/24/18 5\' 5"  (1.651 m)    Physical exam: Exam: Gen: NAD, conversant, well nourised, obese, well groomed                     CV: RRR, no MRG. No Carotid Bruits. No peripheral edema, warm, nontender Eyes: Conjunctivae clear without exudates or hemorrhage  Neuro: Detailed Neurologic Exam  Speech:    Speech is normal; fluent and spontaneous with normal comprehension.  Cognition:  MMSE - Mini Mental State Exam 02/24/2018  Orientation to time 3  Orientation to Place 5  Registration  3  Attention/ Calculation 5  Recall 2  Language- name 2 objects 2  Language- repeat 0  Language- follow 3 step command 3  Language- read & follow direction 1  Write a sentence 1  Copy design 0  Total score 25    Cranial Nerves:    The pupils are equal, round, and reactive to light. Attempted fundoscopic exam could not visualize. visual fields are full to finger confrontation. Extraocular movements are intact. Trigeminal sensation is intact and the muscles of mastication are normal. The face is symmetric. The palate elevates in the midline. Hearing intact. Voice is normal. Shoulder shrug is normal. The tongue has normal motion without fasciculations.   Coordination:    No dysmetria  Gait:    Decreased clearance of feet, slightly stooped, some en bloc turning,   Motor Observation:    No asymmetry, no atrophy, and no involuntary movements noted. Tone:    Possible increased tone/cogwheeling right arm with facilitation   Posture:    Stooped, nuchal rigidity    Strength:    Strength is V/V in the upper and lower limbs.      Sensation: intact to LT     Reflex Exam:  DTR's:    Trace Ajs, otherwise deep tendon reflexes in the upper and lower extremities are mildly brisk for age bilaterally.   Toes:    The toes are downgoing bilaterally.   Clonus:    Clonus is absent.      Assessment/Plan:  Patient with interesting symptoms, primarily visual and auditory hallucinations. They say no significant short term memory loss and she takes care of all the household bills and manages her own mediations successfully.  Possibly some minimal parkinsonism on exam.   - Prominent Hallucinations at onset (atypical presentation for alzheimers) with auditory and visual hallucinations and delusions, less so memory loss.. - Given presentation may consider Dementia with Lewy Bodies or other dementia but needs full workup for atypical symptoms -  MRI brain w/wo contrast to evaluate for reversible  cause of dementia -  DAT scan for parkinsons disorders - Labwork today - Formal neurocognitive testing  - She is on seroquel - Do not recommend Ambien in elderly patients but often difficult to wean off if they have been on for years, will defer to pcp for this  CC:  forgetfullness  Addendum 8/12/219: Patient presented with an unusual pattern of worsening hallucintations without significant memory loss, rapidly progressive for last several weeks. Concerning labs with  1. UTI.  2. B12 deficiency and  3. Very high thyroid antibodies that may possibly be Hashimoto's Encephalitis.   Discussed with husband:  1. Start Bactrim DS and wait for sensitivities, called it in to pharmacy 2. She should take 2000 B12 oral daily this weekend but next week need to start on B12 injections Monday 3. Hashimoto's encephalitis or encephalopathy is a rare autoimmune disease. It can present as rapidly progressive dementia which is treatable with high dose steroids. Will discuss Monday with infusion suite for a course of high-dose IV steroids which we can start Monday hopefully for several days and then an oral taper.   Thyroperoxidase antibodies 192 (normal 0-34) Thyroglobulin antibodies 91 (normal 0-0.9)  Orders Placed This Encounter  Procedures  . Culture, Urine  . Microscopic Examination  . MR BRAIN W WO CONTRAST  . NM BRAIN IMAGING W/SPECT (DATSCAN)  . B12 and Folate Panel  . Homocysteine  . Methylmalonic acid, serum  . Ammonia  . RPR  . Thyroglobulin antibody  . Thyroid peroxidase antibody  . Vitamin B1  . Urinalysis, Routine w reflex microscopic  . Comprehensive metabolic panel  . CBC  . Comprehensive Drug Analysis,Ur     Sarina Ill, MD  [facility] [address] [place], [place] [address]  Phone [phone] Fax [phone]

## 2018-02-24 NOTE — Patient Instructions (Addendum)
MRI of the brain Labwork Dopamine Transport Test of the brain to test for Dementia with Lewy Bodies Will start Aricept   Lewy Body Dementia Dementia is the loss of two or more brain functions, such as:  Memory.  Decision making.  Behavior.  Speaking.  Thinking.  Problem solving.  Lewy body dementia is a type of dementia that gets worse with time. Lewy body dementia is also called dementia with Lewy bodies. What are the causes? This condition is caused by the buildup of proteins called Lewy bodies in brain cells. It is not known what causes the Lewy bodies to build up. What are the signs or symptoms? Early symptoms of this condition include:  Seeing things that are not there (hallucinating).  Trouble sleeping.  Loss of smell.  Later symptoms include:  Problems with problem solving, abstract thinking, and reasoning.  Memory problems.  Poor judgment.  Confusion.  Reduced attention span.  False ideas about another person or situation (delusions).  Disorganized speech.  Sleep problems, such as acting out dreams.  Shifts in alertness and attention, such as: ? Periods of drowsiness or lack of energy (lethargy). ? Long periods of time spent staring into space.  Changes in movement. For example: ? Trouble moving. ? Slow movement. ? Poor posture. ? Rigid muscles. ? Shuffling movements (gait). ? Tremors.  How is this diagnosed? This condition is diagnosed with an assessment by your health care provider. During this assessment, your health care provider will talk to you and your family, friends, or caregivers about your symptoms. A thorough medical history will be taken, and you will have a physical exam and tests. Tests may include:  Lab tests, such as blood or urine tests.  Imaging tests, such as a CT scan, PET scan, or MRI.  A lumbar puncture. This test involves removing and testing a small amount of the fluid that surrounds the brain and spinal  cord.  An electroencephalogram (EEG). In this test, small metal discs are used to measure electrical activity in the brain.  Memory tests, cognitive tests, and neuropsychological tests. These tests evaluate brain function.  How is this treated? There is no cure for this condition. Medicines may be prescribed to help slow down how fast the dementia gets worse and to help with symptoms. Follow these instructions at home: Medicine  Take over-the-counter and prescription medicines only as told by your health care provider.  Avoid taking medicines that can affect thinking, such as pain or sleeping medicines. Lifestyle   Make healthy lifestyle choices: ? Be physically active as told by your health care provider. ? Do not use any tobacco products, such as cigarettes, chewing tobacco, and e-cigarettes. If you need help quitting, ask your health care provider. ? Eat a healthy diet. ? Practice stress-management techniques when you get stressed. ? Stay social.  Drink enough fluid to keep your urine clear or pale yellow.  Make sure you sleep well. These tips can help you get a good night's rest: ? Avoid napping during the day. ? Keep your sleeping area dark and cool. ? Avoid exercising during the few hours before you go to bed. ? Avoid caffeine products in the evening. General instructions  Work with your health care provider to determine what you need help with and what your safety needs are.  If you were given a bracelet that tracks your location, make sure to wear it.  Keep all follow-up visits as told by your health care provider. This is important. Contact  a health care provider if:  You have any new symptoms.  You have problems with choking or swallowing.  You have any symptoms of a new or different illness. Get help right away if:  You develop a fever.  You have new or worsening confusion.  You have new or worsening trouble with sleeping.  You have a hard time staying  awake.  You or your family members become concerned for your safety. This information is not intended to replace advice given to you by your health care provider. Make sure you discuss any questions you have with your health care provider. Document Released: 03/28/2002 Document Revised: 08/12/2015 Document Reviewed: 04/03/2015 Elsevier Interactive Patient Education  2018 [employer].  Donepezil tablets What is this medicine? DONEPEZIL (doe NEP e zil) is used to treat mild to moderate dementia caused by Alzheimer's disease. This medicine may be used for other purposes; ask your health care provider or pharmacist if you have questions. COMMON BRAND NAME(S): Aricept What should I tell my health care provider before I take this medicine? They need to know if you have any of these conditions: -asthma or other lung disease -difficulty passing urine -head injury -heart disease -history of irregular heartbeat -liver disease -seizures (convulsions) -stomach or intestinal disease, ulcers or stomach bleeding -an unusual or allergic reaction to donepezil, other medicines, foods, dyes, or preservatives -pregnant or trying to get pregnant -breast-feeding How should I use this medicine? Take this medicine by mouth with a glass of water. Follow the directions on the prescription label. You may take this medicine with or without food. Take this medicine at regular intervals. This medicine is usually taken before bedtime. Do not take it more often than directed. Continue to take your medicine even if you feel better. Do not stop taking except on your doctor's advice. If you are taking the 23 mg donepezil tablet, swallow it whole; do not cut, crush, or chew it. Talk to your pediatrician regarding the use of this medicine in children. Special care may be needed. Overdosage: If you think you have taken too much of this medicine contact a poison control center or emergency room at once. NOTE: This medicine  is only for you. Do not share this medicine with others. What if I miss a dose? If you miss a dose, take it as soon as you can. If it is almost time for your next dose, take only that dose, do not take double or extra doses. What may interact with this medicine? Do not take this medicine with any of the following medications: -certain medicines for fungal infections like itraconazole, fluconazole, posaconazole, and voriconazole -cisapride -dextromethorphan; quinidine -dofetilide -dronedarone -pimozide -quinidine -thioridazine -ziprasidone This medicine may also interact with the following medications: -antihistamines for allergy, cough and cold -atropine -bethanechol -carbamazepine -certain medicines for bladder problems like oxybutynin, tolterodine -certain medicines for Parkinson's disease like benztropine, trihexyphenidyl -certain medicines for stomach problems like dicyclomine, hyoscyamine -certain medicines for travel sickness like scopolamine -dexamethasone -ipratropium -NSAIDs, medicines for pain and inflammation, like ibuprofen or naproxen -other medicines for Alzheimer's disease -other medicines that prolong the QT interval (cause an abnormal heart rhythm) -phenobarbital -phenytoin -rifampin, rifabutin or rifapentine This list may not describe all possible interactions. Give your health care provider a list of all the medicines, herbs, non-prescription drugs, or dietary supplements you use. Also tell them if you smoke, drink alcohol, or use illegal drugs. Some items may interact with your medicine. What should I watch for while using this medicine?  Visit your doctor or health care professional for regular checks on your progress. Check with your doctor or health care professional if your symptoms do not get better or if they get worse. You may get drowsy or dizzy. Do not drive, use machinery, or do anything that needs mental alertness until you know how this drug affects  you. What side effects may I notice from receiving this medicine? Side effects that you should report to your doctor or health care professional as soon as possible: -allergic reactions like skin rash, itching or hives, swelling of the face, lips, or tongue -feeling faint or lightheaded, falls -loss of bladder control -seizures -signs and symptoms of a dangerous change in heartbeat or heart rhythm like chest pain; dizziness; fast or irregular heartbeat; palpitations; feeling faint or lightheaded, falls; breathing problems -signs and symptoms of infection like fever or chills; cough; sore throat; pain or trouble passing urine -signs and symptoms of liver injury like dark yellow or brown urine; general ill feeling or flu-like symptoms; light-colored stools; loss of appetite; nausea; right upper belly pain; unusually weak or tired; yellowing of the eyes or skin -slow heartbeat or palpitations -unusual bleeding or bruising -vomiting Side effects that usually do not require medical attention (report to your doctor or health care professional if they continue or are bothersome): -diarrhea, especially when starting treatment -headache -loss of appetite -muscle cramps -nausea -stomach upset This list may not describe all possible side effects. Call your doctor for medical advice about side effects. You may report side effects to FDA at 1-800-FDA-1088. Where should I keep my medicine? Keep out of reach of children. Store at room temperature between 15 and 30 degrees C (59 and 86 degrees F). Throw away any unused medicine after the expiration date. NOTE: This sheet is a summary. It may not cover all possible information. If you have questions about this medicine, talk to your doctor, pharmacist, or health care provider.  2018 Elsevier/Gold Standard (2015-12-23 21:00:42)

## 2018-02-26 ENCOUNTER — Other Ambulatory Visit: Payer: Self-pay | Admitting: Neurology

## 2018-02-26 ENCOUNTER — Telehealth: Payer: Self-pay | Admitting: Neurology

## 2018-02-26 LAB — URINE CULTURE

## 2018-02-26 MED ORDER — SULFAMETHOXAZOLE-TRIMETHOPRIM 800-160 MG PO TABS: 1.0000 | ORAL_TABLET | Freq: Two times a day (BID) | ORAL | 0 refills | Status: DC

## 2018-02-26 NOTE — Telephone Encounter (Signed)
Patient presented with an unusual pattern of worsening hallucintations without significant memory loss, rapidly progressive for last several weeks. Concerning labs with 1. UTI. 2. B12 deficiency and 3. Very high thyroid antibodies that may possibly be Hashimoto's Encephalitis. Discussed with husband:  1. Start Bactrim DS and wait for sensitivities, called it in to pharmacy 2. She should take 2000 B12 oral daily this weekend but next week need to start on B12 injections Monday 3. Hashimoto's encephalitis or encephalopathy is a rare autoimmune disease. It can present as rapidly progressive dementia which is treatable with high dose steroids. Will discuss Monday with infusion suite for a course of high-dose IV steroids which we can start Monday hopefully for several days and then an oral taper.   Thyroperoxidase antibodies 192 (normal 0-34) Thyroglobulin antibodies 91 (normal 0-0.9)  4. Will send to Dr. Jannifer Franklin, work in doc, in case patient acutely worsens over the weekend and calls should go to ED.   Romelle Starcher, lets take care of this Monday morning first thing thanks

## 2018-02-27 LAB — COMPREHENSIVE METABOLIC PANEL
ALT: 12 IU/L (ref 0–32)
AST: 16 IU/L (ref 0–40)
Albumin/Globulin Ratio: 1.7 (ref 1.2–2.2)
Albumin: 4.4 g/dL (ref 3.5–4.7)
Alkaline Phosphatase: 90 IU/L (ref 39–117)
BUN/Creatinine Ratio: 14 (ref 12–28)
BUN: 15 mg/dL (ref 8–27)
Bilirubin Total: 0.4 mg/dL (ref 0.0–1.2)
CALCIUM: 8.8 mg/dL (ref 8.7–10.3)
CO2: 22 mmol/L (ref 20–29)
Chloride: 102 mmol/L (ref 96–106)
Creatinine, Ser: 1.04 mg/dL — ABNORMAL HIGH (ref 0.57–1.00)
GFR calc Af Amer: 58 mL/min/{1.73_m2} — ABNORMAL LOW (ref >59)
GFR, EST NON AFRICAN AMERICAN: 51 mL/min/{1.73_m2} — AB (ref >59)
GLOBULIN, TOTAL: 2.6 g/dL (ref 1.5–4.5)
Glucose: 94 mg/dL (ref 65–99)
Potassium: 4.5 mmol/L (ref 3.5–5.2)
SODIUM: 139 mmol/L (ref 134–144)
Total Protein: 7 g/dL (ref 6.0–8.5)

## 2018-02-27 LAB — METHYLMALONIC ACID, SERUM: METHYLMALONIC ACID: 387 nmol/L — AB (ref 0–378)

## 2018-02-27 LAB — URINALYSIS, ROUTINE W REFLEX MICROSCOPIC
Bilirubin, UA: NEGATIVE
Glucose, UA: NEGATIVE
KETONES UA: NEGATIVE
NITRITE UA: NEGATIVE
Protein, UA: NEGATIVE
RBC, UA: NEGATIVE
SPEC GRAV UA: 1.019 (ref 1.005–1.030)
UUROB: 0.2 mg/dL (ref 0.2–1.0)
pH, UA: 5.5 (ref 5.0–7.5)

## 2018-02-27 LAB — CBC
Hematocrit: 40.5 % (ref 34.0–46.6)
Hemoglobin: 13.2 g/dL (ref 11.1–15.9)
MCH: 28.2 pg (ref 26.6–33.0)
MCHC: 32.6 g/dL (ref 31.5–35.7)
MCV: 87 fL (ref 79–97)
Platelets: 199 10*3/uL (ref 150–450)
RBC: 4.68 x10E6/uL (ref 3.77–5.28)
RDW: 13.3 % (ref 12.3–15.4)
WBC: 6.1 10*3/uL (ref 3.4–10.8)

## 2018-02-27 LAB — MICROSCOPIC EXAMINATION: Casts: NONE SEEN /lpf

## 2018-02-27 LAB — THYROID PEROXIDASE ANTIBODY: Thyroperoxidase Ab SerPl-aCnc: 192 IU/mL — ABNORMAL HIGH (ref 0–34)

## 2018-02-27 LAB — VITAMIN B1: Thiamine: 102 nmol/L (ref 66.5–200.0)

## 2018-02-27 LAB — HOMOCYSTEINE: HOMOCYSTEINE: 21.1 umol/L — AB (ref 0.0–15.0)

## 2018-02-27 LAB — AMMONIA: Ammonia: 41 ug/dL (ref 19–87)

## 2018-02-27 LAB — RPR: RPR: NONREACTIVE

## 2018-02-27 LAB — B12 AND FOLATE PANEL
FOLATE: 11.7 ng/mL (ref >3.0)
VITAMIN B 12: 389 pg/mL (ref 232–1245)

## 2018-02-27 LAB — THYROGLOBULIN ANTIBODY: Thyroglobulin Antibody: 91.2 IU/mL — ABNORMAL HIGH (ref 0.0–0.9)

## 2018-02-28 ENCOUNTER — Telehealth: Payer: Self-pay | Admitting: Neurology

## 2018-02-28 ENCOUNTER — Encounter: Payer: Self-pay | Admitting: Neurology

## 2018-02-28 DIAGNOSIS — R443 Hallucinations, unspecified: Secondary | ICD-10-CM | POA: Insufficient documentation

## 2018-02-28 NOTE — Telephone Encounter (Signed)
Please call for B12 injections and then want to talk to them about what I found and talk about steroids. Wed is fine.

## 2018-02-28 NOTE — Telephone Encounter (Signed)
Thanks will discuss with them at that time.

## 2018-02-28 NOTE — Telephone Encounter (Signed)
Called to schedule B12 injections, husband will call back

## 2018-02-28 NOTE — Telephone Encounter (Signed)
We have an opening at 1:00 and can see pt followed by potential steroid infusion at 1:30. Will await availability from [facility] in infusion then return husband's call.

## 2018-02-28 NOTE — Telephone Encounter (Addendum)
Dr. Jaynee Eagles spoke with pt on phone. Will await call back from pt's husband.

## 2018-02-28 NOTE — Telephone Encounter (Addendum)
Per Otila Kluver RN in infusion, Wednesday @ 1:30 is available for infusion.   Called and spoke with pt's husband Bill. Discussed that Dr. Jaynee Eagles would like to start pt on IV steroids d/t concern that pt may possibly have Hashimoto's Encephalitis and B12 injections. He verbalized understanding and agreed for pt to come on Wed 8/14 @ 1:00 arrival time 12:45 for B12 injections and discussion of steroids. Pt will be able to go right to infusion suite after appt for first infusion. Pt scheduled. He also confirmed that pt had started the oral B12. He had no questions at this time. Order went ahead and written per Dr. Jaynee Eagles for Solumedrol 1 gram IV once daily x 3 days.

## 2018-03-01 NOTE — Telephone Encounter (Signed)
Orders signed for Solumedrol infusion and taken to intrafusion along with pt's insurance card copy.

## 2018-03-02 ENCOUNTER — Telehealth: Payer: Self-pay | Admitting: Neurology

## 2018-03-02 ENCOUNTER — Encounter: Payer: Self-pay | Admitting: Neurology

## 2018-03-02 ENCOUNTER — Ambulatory Visit (INDEPENDENT_AMBULATORY_CARE_PROVIDER_SITE_OTHER): Payer: Medicare Other | Admitting: Neurology

## 2018-03-02 VITALS — BP 142/74 | HR 73 | Ht 65.0 in | Wt 199.0 lb

## 2018-03-02 DIAGNOSIS — N390 Urinary tract infection, site not specified: Secondary | ICD-10-CM | POA: Diagnosis not present

## 2018-03-02 DIAGNOSIS — G9349 Other encephalopathy: Secondary | ICD-10-CM

## 2018-03-02 DIAGNOSIS — R413 Other amnesia: Secondary | ICD-10-CM | POA: Diagnosis not present

## 2018-03-02 DIAGNOSIS — E063 Autoimmune thyroiditis: Secondary | ICD-10-CM

## 2018-03-02 DIAGNOSIS — R443 Hallucinations, unspecified: Secondary | ICD-10-CM | POA: Diagnosis not present

## 2018-03-02 DIAGNOSIS — R441 Visual hallucinations: Secondary | ICD-10-CM | POA: Diagnosis not present

## 2018-03-02 DIAGNOSIS — E538 Deficiency of other specified B group vitamins: Secondary | ICD-10-CM | POA: Diagnosis not present

## 2018-03-02 LAB — COMPREHENSIVE DRUG ANALYSIS,UR

## 2018-03-02 MED ORDER — CYANOCOBALAMIN 1000 MCG/ML IJ SOLN
1000.0000 ug | Freq: Once | INTRAMUSCULAR | Status: AC
  Administered 2018-03-02: 1000 ug via INTRAMUSCULAR

## 2018-03-02 NOTE — Telephone Encounter (Signed)
Patient is coming for IV tommrw 03/03/2018 with Rachael Carpenter . Patient needs' to Sign her DAT scan Consent .  Rachael Carpenter will you get her to sign please I will be out of the office until Monday. Thanks Rachael Carpenter.  I have called and talked to patient she is aware.

## 2018-03-02 NOTE — Progress Notes (Signed)
[facility]    Provider:  Dr Jaynee Eagles Referring Provider: [name], MD Primary Care Physician:  [name], MD  CC:  forgetfullness  Interval history: reviewed with them today 1. UTI: being treated 2. Low B12, elevated MMA and Homocysteine: B12 deficiency, started injections 3. Extremely elevated Thyroid antibodies, Hashimoto's encephalitis is a rare cause of reversible dementia and feel benefits of treatment outweigh risks.  Discussed risks of steroids, they agree to proceed. 1000mg  daily x 3 days then an oral taper over 4 weeks. Will see if we get any results. Again, thsi is an extremely rare disorder and her elevated thyroid antibodies does not give [place] a definitive diagnosis but feel treating with steroids that could possibly reverse dementia is well worth the risk.  Addendum 8/12/219: Patient presented with an unusual pattern of worsening hallucintations without significant memory loss, rapidly progressive for last several weeks. Concerning labs with  1. UTI.  2. B12 deficiency and  3. Very high thyroid antibodies that may possibly be Hashimoto's Encephalitis.   Discussed with husband:  1. Start Bactrim DS and wait for sensitivities, called it in to pharmacy 2. She should take 2000 B12 oral daily this weekend but next week need to start on B12 injections Monday 3. Hashimoto's encephalitis or encephalopathy is a rare autoimmune disease. It can present as rapidly progressive dementia which is treatable with high dose steroids. Will discuss Monday with infusion suite for a course of high-dose IV steroids which we can start Monday hopefully for several days and then an oral taper.   Thyroperoxidase antibodies 192 (normal 0-34) Thyroglobulin antibodies 91 (normal 0-0.9)   HPI:  Rachael Carpenter is a 83 y.o. female here as a referral from Dr. Philip Aspen for memory problems.  Past medical history hypothyroidism, cough, overweight, hyperlipidemia,  intestinal malrotation, HTN, HLD. Husband provides most information. She has been having hallucinations, elderly lady and her husband visit every night, also seeing other things every day she hears her ex son-in-law talking in her house and tells husband what they are saying. No significant vision problems. Patient has poor insight into her symptoms, she is not sure she has memory loss maybe a little. Been ongoing for a year or longer. Brother and sister with dementia, unknown which kind. A little short term memory loss but husband doesn't think it is significant. When she hears voices in her house she talks in a whisper. Appears Hallucinations started with confusion when he says the symptoms started, but forgetting appointments, she is good at putting reminders on the fridge, she pays the bills and doesn't miss any, she takes her own medications and appears she takes her meds per husband. The house is not as clean like she used to, not cooling as much as she used to, less social. She has lost several close family members and sisters and grand daughter had a drug program and she was living with patient and this is when decline occurred. She denies falls or imbalance, REM sleep disorder but husband does say she wanders at night. She is on Seroquel.   Reviewed notes, labs and imaging from outside physicians, which showed:  CT head 10/2017 reviewed images and agree with following: Atrophy and chronic microvascular ischemia.  No acute abnormality.  Mother lived until the age of 22, she has 19 sisters and 3 brothers, 3 daughters, 5 grandchildren no mention of dementia in the note her family history.  Reviewed physical exam which was unremarkable essentially normal except some osteoarthritis in multiple  IP joints especially the right middle finger foot exam was normal, appears to be compliant, never smoker, forgetfulness.  It does state he is at forgetfulness most likely from early Alzheimer's disease, could be from  slight hypothyroidism and or less likely from vitamin B12 deficiency but they checked a B12 level and she is on medication for her hypothyroidism.  Review of Systems: Patient complains of symptoms per HPI as well as the following symptoms: memory loss, hallucinations. Pertinent negatives and positives per HPI. All others negative.   Social History   Socioeconomic History  . Marital status: Married    Spouse name: Not on file  . Number of children: 3  . 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 Highest education level: High school graduate  Occupational History  . Not on file  Social Needs  . Financial resource strain: Not on file  . Food insecurity:    Worry: Not on file    Inability: Not on file  . Transportation needs:    Medical: Not on file    Non-medical: Not on file  Tobacco Use  . Smoking status: Never Smoker  . Smokeless tobacco: Never Used  Substance and Sexual Activity  . Alcohol use: Never    Frequency: Never  . Drug use: Never  . Sexual activity: Not on file  Lifestyle  . Physical activity:    Days per week: Not on file    Minutes per session: Not on file  . Stress: Not on file  Relationships  . Social connections:    Talks on phone: Not on file    Gets together: Not on file    Attends religious service: Not on file    Active member of club or organization: Not on file    Attends meetings of clubs or organizations: Not on file    Relationship status: Not on file  . Intimate partner violence:    Fear of current or ex partner: Not on file    Emotionally abused: Not on file    Physically abused: Not on file    Forced sexual activity: Not on file  Other Topics Concern  . Not on file  Social History Narrative   Lives at home with her husband   Right handed    Family History  Problem Relation Age of Onset  . COPD Sister   . Cancer Daughter   . Cancer Brother   . Cancer Brother   . Dementia Neg Hx     Past Medical History:    Diagnosis Date  . Colon polyp    in cecum on July 2010 Colonoscopy  . Dementia   . Hyperlipidemia   . Hypothyroidism   . Intestinal malrotation    on CT scan 2010 in [place]  . Neck pain    L; pt denies as of 02/24/18  . Thyroid disease     Past Surgical History:  Procedure Laterality Date  . 4th finger flexor sheath ganglion Left 02/2006  . CHOLECYSTECTOMY  1990s  . [place]  . RIGHT FOOT MOLE EXCISION Right 2011  . scalp lesion removal  2019  . SHOULDER RECONSTRUCTION Right 07/2001  . SHOULDER SURGERY Right   . TOTAL ABDOMINAL HYSTERECTOMY     83 y.o.    Current Outpatient Medications  Medication Sig Dispense Refill  . amLODipine (NORVASC) 2.5 MG tablet Take 2.5 mg by mouth daily.    Marland Kitchen aspirin 81 MG chewable tablet Chew  81 mg by mouth daily.    Marland Kitchen atorvastatin (LIPITOR) 40 MG tablet Take 40 mg by mouth daily.    . Cyanocobalamin (B-12 PO) Take 500 mcg by mouth daily.    Marland Kitchen donepezil (ARICEPT) 10 MG tablet Take 1 tablet (10 mg total) by mouth at bedtime. 30 tablet 11  . levothyroxine (SYNTHROID, LEVOTHROID) 200 MCG tablet Take 200 mcg by mouth daily.   2  . loratadine (CLARITIN) 10 MG tablet Take 10 mg by mouth daily.    Marland Kitchen losartan (COZAAR) 100 MG tablet Take 100 mg by mouth daily.    . Multiple Minerals-Vitamins (CITRACAL PLUS) TABS Take 1 tablet by mouth daily.    . QUEtiapine (SEROQUEL) 100 MG tablet Take 100 mg by mouth. Every afternoon    . sulfamethoxazole-trimethoprim (BACTRIM DS,SEPTRA DS) 800-160 MG tablet Take 1 tablet by mouth 2 (two) times daily. 14 tablet 0  . zolpidem (AMBIEN) 10 MG tablet Take 5-10 mg by mouth at bedtime as needed for sleep.     No current facility-administered medications for this visit.     Allergies as of 03/02/2018 - Review Complete 03/02/2018  Allergen Reaction Noted  . Penicillins      Vitals: BP (!) 142/74 (BP Location: Right Arm, Patient Position: Sitting)   Pulse 73   Ht 5\' 5"  (1.651 m)   Wt 199 lb  (90.3 kg)   BMI 33.12 kg/m  Last Weight:  Wt Readings from Last 1 Encounters:  03/02/18 199 lb (90.3 kg)   Last Height:   Ht Readings from Last 1 Encounters:  03/02/18 5\' 5"  (1.651 m)    Physical exam: Exam: Gen: NAD, conversant, well nourised, obese, well groomed                     CV: RRR, no MRG. No Carotid Bruits. No peripheral edema, warm, nontender Eyes: Conjunctivae clear without exudates or hemorrhage  Neuro: Detailed Neurologic Exam  Speech:    Speech is normal; fluent and spontaneous with normal comprehension.  Cognition:  MMSE - Mini Mental State Exam 02/24/2018  Orientation to time 3  Orientation to Place 5  Registration 3  Attention/ Calculation 5  Recall 2  Language- name 2 objects 2  Language- repeat 0  Language- follow 3 step command 3  Language- read & follow direction 1  Write a sentence 1  Copy design 0  Total score 25    Cranial Nerves:    The pupils are equal, round, and reactive to light. Attempted fundoscopic exam could not visualize. visual fields are full to finger confrontation. Extraocular movements are intact. Trigeminal sensation is intact and the muscles of mastication are normal. The face is symmetric. The palate elevates in the midline. Hearing intact. Voice is normal. Shoulder shrug is normal. The tongue has normal motion without fasciculations.   Coordination:    No dysmetria  Gait:    Decreased clearance of feet, slightly stooped, some en bloc turning,   Motor Observation:    No asymmetry, no atrophy, and no involuntary movements noted. Tone:    Possible increased tone/cogwheeling right arm with facilitation   Posture:    Stooped, nuchal rigidity    Strength:    Strength is V/V in the upper and lower limbs.      Sensation: intact to LT     Reflex Exam:  DTR's:    Trace Ajs, otherwise deep tendon reflexes in the upper and lower extremities are mildly brisk for age bilaterally.  Toes:    The toes are downgoing  bilaterally.   Clonus:    Clonus is absent.      Assessment/Plan:  Patient with interesting symptoms, primarily visual and auditory hallucinations. They say no significant short term memory loss and she takes care of all the household bills and manages her own mediations successfully.  Possibly some minimal parkinsonism on exam.   Interval history: reviewed lab results with them today 1. UTI: being treated with Bactrim currently 2. Low B12, elevated MMA and Homocysteine: Consistent with B12 deficiency, started injections today 3. Extremely elevated Thyroid antibodies, Hashimoto's encephalitis is a rare cause of reversible dementia and feel benefits of treatment outweigh risks. Thyroperoxidase antibodies 192 (normal 0-34) Thyroglobulin antibodies 91 (normal 0-0.9) Discussed risks of steroids, they agree to proceed. 1000mg  daily x 3 days then an oral taper over 4 weeks. Will see if we get any results. Again, this is an extremely rare disorder and her elevated thyroid antibodies does not give [place] a definitive diagnosis but feel treating with steroids that could possibly reverse dementia is well worth a try.    - Prominent Hallucinations at onset (atypical presentation for alzheimers) with auditory and visual hallucinations and delusions, less so memory loss.. - Given presentation may consider Dementia with Lewy Bodies or other dementia but needs full workup for atypical symptoms - MRI brain w/wo contrast to evaluate for reversible cause of dementia -  DAT scan for parkinsons disorders - Labwork today - Formal neurocognitive testing  - She is on seroquel - Do not recommend Ambien in elderly patients but often difficult to wean off if they have been on for years, will defer to pcp for this  CC:  forgetfullness  Addendum 8/12/219: Patient presented with an unusual pattern of worsening hallucintations without significant memory loss, rapidly progressive for last several weeks. Concerning labs  with  1. UTI.  2. B12 deficiency and  3. Very high thyroid antibodies that may possibly be Hashimoto's Encephalitis.   Discussed with husband:  1. Start Bactrim DS and wait for sensitivities, called it in to pharmacy 2. She should take 2000 B12 oral daily this weekend but next week need to start on B12 injections Monday 3. Hashimoto's encephalitis or encephalopathy is a rare autoimmune disease. It can present as rapidly progressive dementia which is treatable with high dose steroids. Will discuss Monday with infusion suite for a course of high-dose IV steroids which we can start Monday hopefully for several days and then an oral taper.   Thyroperoxidase antibodies 192 (normal 0-34) Thyroglobulin antibodies 91 (normal 0-0.9)   Sarina Ill, MD  [facility] [address] [place], [place] [address]  Phone [phone] Fax [phone]  A total of 40 minutes was spent face-to-face with this patient. Over half this time was spent on counseling patient on the Vitamin B12 deficiency - Plan: cyanocobalamin ((VITAMIN B-12)) injection 1,000 mcg  Hashimoto encephalopathy start steroids as above  diagnosis and different diagnostic and therapeutic options, counseling and coordination of care, risks ans benefits of management, compliance, or risk factor reduction and education.

## 2018-03-02 NOTE — Telephone Encounter (Signed)
Husband informed us today that they have a "bed bug infestation". She was supposed to come back for 2 more days of IV steroids. I think I will start oral steroids instead with a taper, lets discuss. I spoke to patient's husband and explained the situation and he understood, we don;t want to place our other patients at risk.

## 2018-03-02 NOTE — Progress Notes (Signed)
Pt given B12 injection 1000 mcg IM x 1. Pt tolerated well, no bleeding, bandaid applied. Aseptic technique used. Plan per Dr. Jaynee Eagles is for 1000 mcg injection weekly x 4 weeks followed by one injection monthly x 6 months. Pt and husband aware.

## 2018-03-03 ENCOUNTER — Other Ambulatory Visit: Payer: Self-pay | Admitting: Neurology

## 2018-03-03 MED ORDER — PREDNISONE 20 MG PO TABS: ORAL_TABLET | ORAL | 0 refills | Status: DC

## 2018-03-03 NOTE — Telephone Encounter (Signed)
Bethany I am going to do an oral taper over the next several weeks. Take pills in the morning with food.   80mg (4 pills) daily x 7 days, 60mg (3 pills) daily x 7 days, 40mg (2 pills) daily x 7 days, 20mg (1 pill) daily x 7 days, 10mg (1/2 pill) daily x 7 days  I spoke to husband, explained I will start oral steroids and not IV steroids due to their bed bug infestation.   I do not believe changing to oral steroids will impact her treatment negatively and this was always the plan to start oral steroids after infusion. thanks

## 2018-03-03 NOTE — Telephone Encounter (Addendum)
Per Dr. Jaynee Eagles, will wait for now and hold on DAT scan for 5 weeks and see if steroids help but will proceed with consent when pt comes to office. This was relayed to husband. They plan to come to office today to have this signed and have IV removed since pt will transition to oral steroids.

## 2018-03-03 NOTE — Telephone Encounter (Signed)
Pt's husband Gwyndolyn Saxon called. He stated that pt still has an IV in her arm. Dr. Jaynee Eagles aware and pt will come today to have this removed since pt will start on oral steroids. He also stated that his wife had notes to call places and wasn't sure why. RN looked and saw that MRI is still pending scheduling and pt was to call back. Advised pt's husband to call GI and gave him the number. He was advised to inform GI about the bedbugs so they would know up front. He said they will be getting treatment within the next few days.Reminded him that  Steroid was sent to [facility] in [place] and would need to be picked up, tapering dose over the next several weeks. He is aware that DAT scan consent is needed however we will wait for now to get scan per Dr. Jaynee Eagles and see how steroids help. He was advised that we can get the consent now while they come to get IV removed. He had no further questions.

## 2018-03-04 ENCOUNTER — Other Ambulatory Visit: Payer: Self-pay

## 2018-03-04 ENCOUNTER — Inpatient Hospital Stay (HOSPITAL_COMMUNITY)
Admission: EM | Admit: 2018-03-04 | Discharge: 2018-03-18 | DRG: 885 | Disposition: A | Discharge status: Skilled Nursing Facility | Payer: Medicare Other | Attending: Family Medicine | Admitting: Family Medicine

## 2018-03-04 DIAGNOSIS — G20 Parkinson's disease: Secondary | ICD-10-CM

## 2018-03-04 DIAGNOSIS — G9340 Encephalopathy, unspecified: Secondary | ICD-10-CM | POA: Diagnosis present

## 2018-03-04 DIAGNOSIS — R45851 Suicidal ideations: Secondary | ICD-10-CM | POA: Diagnosis present

## 2018-03-04 DIAGNOSIS — F22 Delusional disorders: Secondary | ICD-10-CM | POA: Diagnosis not present

## 2018-03-04 DIAGNOSIS — I639 Cerebral infarction, unspecified: Secondary | ICD-10-CM | POA: Diagnosis present

## 2018-03-04 DIAGNOSIS — Z79890 Hormone replacement therapy (postmenopausal): Secondary | ICD-10-CM

## 2018-03-04 DIAGNOSIS — E079 Disorder of thyroid, unspecified: Secondary | ICD-10-CM | POA: Diagnosis present

## 2018-03-04 DIAGNOSIS — N179 Acute kidney failure, unspecified: Secondary | ICD-10-CM | POA: Diagnosis present

## 2018-03-04 DIAGNOSIS — R410 Disorientation, unspecified: Secondary | ICD-10-CM | POA: Diagnosis not present

## 2018-03-04 DIAGNOSIS — Z79899 Other long term (current) drug therapy: Secondary | ICD-10-CM

## 2018-03-04 DIAGNOSIS — R443 Hallucinations, unspecified: Secondary | ICD-10-CM | POA: Diagnosis not present

## 2018-03-04 DIAGNOSIS — G92 Toxic encephalopathy: Secondary | ICD-10-CM | POA: Diagnosis present

## 2018-03-04 DIAGNOSIS — Z880 Allergy status to penicillin: Secondary | ICD-10-CM

## 2018-03-04 DIAGNOSIS — G928 Other toxic encephalopathy: Secondary | ICD-10-CM

## 2018-03-04 DIAGNOSIS — N3090 Cystitis, unspecified without hematuria: Secondary | ICD-10-CM | POA: Diagnosis present

## 2018-03-04 DIAGNOSIS — G929 Unspecified toxic encephalopathy: Secondary | ICD-10-CM | POA: Diagnosis present

## 2018-03-04 DIAGNOSIS — I10 Essential (primary) hypertension: Secondary | ICD-10-CM | POA: Diagnosis present

## 2018-03-04 DIAGNOSIS — F0390 Unspecified dementia without behavioral disturbance: Secondary | ICD-10-CM | POA: Diagnosis present

## 2018-03-04 DIAGNOSIS — R440 Auditory hallucinations: Secondary | ICD-10-CM | POA: Diagnosis present

## 2018-03-04 DIAGNOSIS — Z86010 Personal history of colonic polyps: Secondary | ICD-10-CM

## 2018-03-04 DIAGNOSIS — Z7982 Long term (current) use of aspirin: Secondary | ICD-10-CM

## 2018-03-04 DIAGNOSIS — E538 Deficiency of other specified B group vitamins: Secondary | ICD-10-CM | POA: Diagnosis present

## 2018-03-04 DIAGNOSIS — E063 Autoimmune thyroiditis: Secondary | ICD-10-CM | POA: Diagnosis present

## 2018-03-04 DIAGNOSIS — B9620 Unspecified Escherichia coli [E. coli] as the cause of diseases classified elsewhere: Secondary | ICD-10-CM | POA: Diagnosis present

## 2018-03-04 DIAGNOSIS — E039 Hypothyroidism, unspecified: Secondary | ICD-10-CM | POA: Diagnosis present

## 2018-03-04 DIAGNOSIS — F0391 Unspecified dementia with behavioral disturbance: Secondary | ICD-10-CM

## 2018-03-04 DIAGNOSIS — F323 Major depressive disorder, single episode, severe with psychotic features: Principal | ICD-10-CM | POA: Diagnosis present

## 2018-03-04 DIAGNOSIS — E785 Hyperlipidemia, unspecified: Secondary | ICD-10-CM | POA: Diagnosis present

## 2018-03-04 DIAGNOSIS — Z6834 Body mass index (BMI) 34.0-34.9, adult: Secondary | ICD-10-CM

## 2018-03-04 DIAGNOSIS — Z90710 Acquired absence of both cervix and uterus: Secondary | ICD-10-CM

## 2018-03-04 LAB — COMPREHENSIVE METABOLIC PANEL
ALBUMIN: 4.1 g/dL (ref 3.5–5.0)
ALT: 22 U/L (ref 0–44)
AST: 42 U/L — AB (ref 15–41)
Alkaline Phosphatase: 83 U/L (ref 38–126)
Anion gap: 7 (ref 5–15)
BILIRUBIN TOTAL: 0.8 mg/dL (ref 0.3–1.2)
BUN: 29 mg/dL — AB (ref 8–23)
CO2: 26 mmol/L (ref 22–32)
CREATININE: 1.39 mg/dL — AB (ref 0.44–1.00)
Calcium: 9 mg/dL (ref 8.9–10.3)
Chloride: 105 mmol/L (ref 98–111)
GFR calc Af Amer: 40 mL/min — ABNORMAL LOW (ref >60)
GFR, EST NON AFRICAN AMERICAN: 35 mL/min — AB (ref >60)
GLUCOSE: 104 mg/dL — AB (ref 70–99)
Potassium: 4.4 mmol/L (ref 3.5–5.1)
Sodium: 138 mmol/L (ref 135–145)
Total Protein: 6.8 g/dL (ref 6.5–8.1)

## 2018-03-04 LAB — SALICYLATE LEVEL: Salicylate Lvl: <7 mg/dL (ref 2.8–30.0)

## 2018-03-04 LAB — CBC
HEMATOCRIT: 39.7 % (ref 36.0–46.0)
Hemoglobin: 12.5 g/dL (ref 12.0–15.0)
MCH: 28.5 pg (ref 26.0–34.0)
MCHC: 31.5 g/dL (ref 30.0–36.0)
MCV: 90.6 fL (ref 78.0–100.0)
PLATELETS: 238 10*3/uL (ref 150–400)
RBC: 4.38 MIL/uL (ref 3.87–5.11)
RDW: 13.8 % (ref 11.5–15.5)
WBC: 9.1 10*3/uL (ref 4.0–10.5)

## 2018-03-04 LAB — ACETAMINOPHEN LEVEL: Acetaminophen (Tylenol), Serum: <10 ug/mL — ABNORMAL LOW (ref 10–30)

## 2018-03-04 LAB — ETHANOL

## 2018-03-04 MED ORDER — QUETIAPINE FUMARATE 25 MG PO TABS
25.0000 mg | ORAL_TABLET | Freq: Once | ORAL | Status: AC
  Administered 2018-03-05: 25 mg via ORAL
  Filled 2018-03-04: qty 1

## 2018-03-04 NOTE — ED Notes (Addendum)
Pt offered a sandwich. She declined.

## 2018-03-04 NOTE — ED Provider Notes (Signed)
[place] EMERGENCY DEPARTMENT Provider Note   CSN: [id number] Arrival date & time: 03/04/18  1501     History   Chief Complaint Chief Complaint  Patient presents with  . Psychiatric Evaluation  . Suicidal    HPI Rachael Carpenter is a 83 y.o. female.  The history is provided by the patient and medical records. No language interpreter was used.  Mental Health Problem  Presenting symptoms: delusional, hallucinations and suicidal threats   Presenting symptoms: no aggressive behavior, no bizarre behavior, no homicidal ideas, no suicidal thoughts and no suicide attempt   Patient accompanied by:  Family member Degree of incapacity (severity):  Severe Onset quality:  Gradual Duration:  3 weeks Timing:  Constant Progression:  Worsening Context: not alcohol use and not drug abuse   Relieved by:  Nothing Worsened by:  Nothing Ineffective treatments:  None tried Associated symptoms: no abdominal pain, no chest pain, no fatigue and no headaches     Past Medical History:  Diagnosis Date  . Colon polyp    in cecum on July 2010 Colonoscopy  . Dementia   . Hyperlipidemia   . Hypothyroidism   . Intestinal malrotation    on CT scan 2010 in [place]  . Neck pain    L; pt denies as of 02/24/18  . Thyroid disease     Patient Active Problem List   Diagnosis Date Noted  . Hallucinations 02/28/2018  . HYPERLIPIDEMIA 07/11/2010  . HYPERTENSION 07/11/2010  . DYSPNEA 07/11/2010  . COUGH 07/11/2010    Past Surgical History:  Procedure Laterality Date  . 4th finger flexor sheath ganglion Left 02/2006  . CHOLECYSTECTOMY  1990s  . [place]  . RIGHT FOOT MOLE EXCISION Right 2011  . scalp lesion removal  2019  . SHOULDER RECONSTRUCTION Right 07/2001  . SHOULDER SURGERY Right   . TOTAL ABDOMINAL HYSTERECTOMY     83 y.o.     OB History   None      Home Medications    Prior to Admission medications   Medication Sig Start Date End  Date Taking? Authorizing Provider  amLODipine (NORVASC) 2.5 MG tablet Take 2.5 mg by mouth daily.    [provider]  aspirin 81 MG chewable tablet Chew 81 mg by mouth daily.    [provider]  atorvastatin (LIPITOR) 40 MG tablet Take 40 mg by mouth daily.    [provider]  Cyanocobalamin (B-12 PO) Take 500 mcg by mouth daily.    [provider]  donepezil (ARICEPT) 10 MG tablet Take 1 tablet (10 mg total) by mouth at bedtime. 02/24/18   Melvenia Beam, MD  levothyroxine (SYNTHROID, LEVOTHROID) 200 MCG tablet Take 200 mcg by mouth daily.  02/21/18   [provider]  loratadine (CLARITIN) 10 MG tablet Take 10 mg by mouth daily.    [provider]  losartan (COZAAR) 100 MG tablet Take 100 mg by mouth daily.    [provider]  Multiple Minerals-Vitamins (CITRACAL PLUS) TABS Take 1 tablet by mouth daily.    [provider]  predniSONE (DELTASONE) 20 MG tablet Take in AM with food.80mg (4 pills)daily x 7 days, 60mg (3 pill) x 7 day, 40mg (2 pill) x 7 day, 20mg (1 pill) x 7 day, 10mg (1/2 pill) x 7 day. 03/03/18   Melvenia Beam, MD  QUEtiapine (SEROQUEL) 100 MG tablet Take 100 mg by mouth. Every afternoon    [provider]  sulfamethoxazole-trimethoprim (BACTRIM DS,SEPTRA  DS) 800-160 MG tablet Take 1 tablet by mouth 2 (two) times daily. 02/26/18   Melvenia Beam, MD  zolpidem (AMBIEN) 10 MG tablet Take 5-10 mg by mouth at bedtime as needed for sleep.    [provider]    Family History Family History  Problem Relation Age of Onset  . COPD Sister   . Cancer Daughter   . Cancer Brother   . Cancer Brother   . Dementia Neg Hx     Social History Social History   Tobacco Use  . Smoking status: Never Smoker  . Smokeless tobacco: Never Used  Substance Use Topics  . Alcohol use: Never    Frequency: Never  . Drug use: Never     Allergies   Penicillins   Review of Systems Review of Systems    Constitutional: Negative for chills, diaphoresis, fatigue and fever.  HENT: Negative for congestion, ear pain and sore throat.   Eyes: Negative for pain and visual disturbance.  Respiratory: Negative for cough, chest tightness, shortness of breath and wheezing.   Cardiovascular: Negative for chest pain and palpitations.  Gastrointestinal: Negative for abdominal pain, constipation, diarrhea, nausea and vomiting.  Genitourinary: Negative for dysuria, flank pain, frequency and hematuria.  Musculoskeletal: Negative for arthralgias, back pain, neck pain and neck stiffness.  Skin: Negative for color change, rash and wound.  Neurological: Negative for dizziness, seizures, syncope, weakness, light-headedness, numbness and headaches.  Psychiatric/Behavioral: Positive for confusion and hallucinations. Negative for behavioral problems, homicidal ideas and suicidal ideas.  All other systems reviewed and are negative.    Physical Exam Updated Vital Signs BP (!) 130/52 (BP Location: Left Arm)   Pulse 88   Temp 98.7 F (37.1 C) (Oral)   Resp 17   SpO2 99%   Physical Exam  Constitutional: She is oriented to person, place, and time. She appears well-developed and well-nourished. No distress.  HENT:  Head: Normocephalic and atraumatic.  Nose: Nose normal.  Mouth/Throat: Oropharynx is clear and moist. No oropharyngeal exudate.  Eyes: Conjunctivae are normal.  Neck: Neck supple.  Cardiovascular: Normal rate and regular rhythm.  No murmur heard. Pulmonary/Chest: Effort normal and breath sounds normal. No respiratory distress. She has no wheezes. She has no rales. She exhibits no tenderness.  Abdominal: Soft. There is no tenderness.  Musculoskeletal: She exhibits no edema.  Neurological: She is alert and oriented to person, place, and time. She is not disoriented. She displays no tremor. No cranial nerve deficit or sensory deficit. She exhibits normal muscle tone. Coordination normal. GCS eye  subscore is 4. GCS verbal subscore is 5. GCS motor subscore is 6.  Skin: Skin is warm and dry. Capillary refill takes less than 2 seconds. No rash noted. She is not diaphoretic. No erythema.  Psychiatric: Her mood appears anxious. Her speech is not rapid and/or pressured. She is actively hallucinating. She is not agitated. She exhibits a depressed mood. She expresses no homicidal and no suicidal ideation. She expresses no suicidal plans and no homicidal plans.  Nursing note and vitals reviewed.    ED Treatments / Results  Labs (all labs ordered are listed, but only abnormal results are displayed) Labs Reviewed  COMPREHENSIVE METABOLIC PANEL - Abnormal; Notable for the following components:      Result Value   Glucose, Bld 104 (*)    BUN 29 (*)    Creatinine, Ser 1.39 (*)    AST 42 (*)    GFR calc non Af Amer 35 (*)  GFR calc Af Amer 40 (*)    All other components within normal limits  ACETAMINOPHEN LEVEL - Abnormal; Notable for the following components:   Acetaminophen (Tylenol), Serum <10 (*)    All other components within normal limits  ETHANOL  SALICYLATE LEVEL  CBC  RAPID URINE DRUG SCREEN, HOSP PERFORMED  URINALYSIS, ROUTINE W REFLEX MICROSCOPIC  COMPREHENSIVE METABOLIC PANEL  CBC WITH DIFFERENTIAL/PLATELET  SODIUM, URINE, RANDOM  CREATININE, URINE, RANDOM    EKG None  Radiology No results found.  Procedures Procedures (including critical care time)  Medications Ordered in ED Medications  QUEtiapine (SEROQUEL) tablet 25 mg (has no administration in time range)  aspirin chewable tablet 81 mg (has no administration in time range)  amLODipine (NORVASC) tablet 2.5 mg (has no administration in time range)  atorvastatin (LIPITOR) tablet 40 mg (has no administration in time range)  donepezil (ARICEPT) tablet 10 mg (has no administration in time range)  QUEtiapine (SEROQUEL) tablet 100 mg (has no administration in time range)  levothyroxine (SYNTHROID, LEVOTHROID)  tablet 200 mcg (has no administration in time range)  B-12 TABS 500 mcg (has no administration in time range)  CITRACAL PLUS TABS 1 tablet (has no administration in time range)  heparin injection 5,000 Units (has no administration in time range)  0.9 %  sodium chloride infusion (has no administration in time range)  acetaminophen (TYLENOL) tablet 650 mg (has no administration in time range)    Or  acetaminophen (TYLENOL) suppository 650 mg (has no administration in time range)  senna-docusate (Senokot-S) tablet 1 tablet (has no administration in time range)  bisacodyl (DULCOLAX) EC tablet 5 mg (has no administration in time range)  ondansetron (ZOFRAN) tablet 4 mg (has no administration in time range)    Or  ondansetron (ZOFRAN) injection 4 mg (has no administration in time range)     Initial Impression / Assessment and Plan / ED Course  I have reviewed the triage vital signs and the nursing notes.  Pertinent labs & imaging results that were available during my care of the patient were reviewed by me and considered in my medical decision making (see chart for details).     Rachael Carpenter is a 83 y.o. female with a past medical history significant for hypertension, hyperlipidemia, thyroid disease, and currently in the midst of a neurologic work-up for autoimmune encephalitis who presents with depression, tearfulness, worsening hallucinations and paranoia and a possible suicidal threat.  Patient is coming by family who reports that over the last few weeks patient has had acutely worsening hallucinations.  She is concerned there are people at her house and they are damaging her home.  The family says she is having with auditory and visual hallucinations.  Patient was seen by a neurologist several days ago who was concerned about Hashimoto's encephalopathy versus a progressive Lewy body dementia.  She is in the midst of getting laboratory testing including several autoimmune titers that have been  elevated and is scheduled to get outpatient MRI.  Patient was given 1 dose of IV high-dose steroids several days ago and her symptoms have continued to worsen.  Patient was also given antibiotics for possible UTI.  She denies any urinary symptoms currently.  She denies any fevers, chills, headache, vision changes, nausea, vomiting, conservation, diarrhea, dysuria or other neurologic deficits.  1, getting factor for the patient receiving several days of IV steroids as was recommended by neurology was that they were found to have bedbugs.  Family reports that patient was  found to have several crawling on her during her last ED stay.  Patient family says that Dermadex is coming to treat the home in several weeks.  On my exam, patient is alert and oriented.  Lungs are clear and chest is nontender.  Patient has normal sensation and strength in all extremities.  No facial droop.  Clear speech.  Abdomen nontender.    Patient is persistent and her delusions of people staying at her home.  She gets upset and cries because no one believes her.  The daughter is very concerned about her worsening delusions and hallucinations as well as the daughter reporting that she said she did not want to be here anymore today.  Daughter interpreted this is a suicidal threat and prompted her to come to the ED.  Patient says she did not mean it as a suicidal threat but agrees that she cannot stand what she is going through.  Due to the ongoing neurology work-up, neurology was called.  They recommended MRI with and without contrast to further evaluate.  They also recommended giving the patient Seroquel.  They will come see the patient and decide if she needs high-dose steroids repeated.  They agree that since the patient's mental status is deteriorating per family and she is in the midst of her neurology work-up, she would benefit from admission and monitoring.  At this time, I do not feel she is medically cleared for TTS evaluation  however if her neurologic work-up is reassuring and she continues to have the hallucinations, tearfulness, and possible suicidal threats, TTS may need to be called.  Next  Medicine team will be called for admission.   Final Clinical Impressions(s) / ED Diagnoses   Final diagnoses:  Hallucination  Delusion [facility])    ED Discharge Orders    None      Clinical Impression: 1. Hallucination   2. Delusion [facility])     Disposition: Admit  This note was prepared with assistance of Systems analyst. Occasional wrong-word or sound-a-like substitutions may have occurred due to the inherent limitations of voice recognition software.     [place] Rollyson, Gwenyth Allegra, MD 03/05/18 [phone]

## 2018-03-04 NOTE — ED Notes (Signed)
Patient's husband came to the triage nurses station to inquire about the wait time. Stated that "[facility] upper management should tell people if they are refusing to treat certain medical issues". Tech encouraged family member that we do our best to treat all issues and assured him that we would see his wife as soon as possible.

## 2018-03-04 NOTE — ED Triage Notes (Addendum)
Pt's daughter reports pt stated, "I should just end my life, there's no point in living any more." Pt's family members report they have been going through a lot recently with family members being sick, and loss of family members. Pt's family members report patient is having auditory/visual hallucinations seeing people who aren't there and having conversations with. Pt reports her ex son-in-law and his mother coming into her house. Pt's husband denies them being there. Pt reports she is upset about recent health issues, and states, "I have cried more this week than I have in my whole life."

## 2018-03-05 ENCOUNTER — Inpatient Hospital Stay (HOSPITAL_COMMUNITY): Payer: Medicare Other

## 2018-03-05 ENCOUNTER — Encounter (HOSPITAL_COMMUNITY): Payer: Self-pay | Admitting: Family Medicine

## 2018-03-05 ENCOUNTER — Observation Stay (HOSPITAL_COMMUNITY): Payer: Medicare Other

## 2018-03-05 DIAGNOSIS — N179 Acute kidney failure, unspecified: Secondary | ICD-10-CM | POA: Diagnosis present

## 2018-03-05 DIAGNOSIS — G92 Toxic encephalopathy: Secondary | ICD-10-CM | POA: Diagnosis present

## 2018-03-05 DIAGNOSIS — Z7982 Long term (current) use of aspirin: Secondary | ICD-10-CM | POA: Diagnosis not present

## 2018-03-05 DIAGNOSIS — F0390 Unspecified dementia without behavioral disturbance: Secondary | ICD-10-CM | POA: Diagnosis present

## 2018-03-05 DIAGNOSIS — I6350 Cerebral infarction due to unspecified occlusion or stenosis of unspecified cerebral artery: Secondary | ICD-10-CM | POA: Diagnosis not present

## 2018-03-05 DIAGNOSIS — E538 Deficiency of other specified B group vitamins: Secondary | ICD-10-CM | POA: Diagnosis present

## 2018-03-05 DIAGNOSIS — F22 Delusional disorders: Secondary | ICD-10-CM | POA: Insufficient documentation

## 2018-03-05 DIAGNOSIS — R443 Hallucinations, unspecified: Secondary | ICD-10-CM | POA: Diagnosis not present

## 2018-03-05 DIAGNOSIS — R41841 Cognitive communication deficit: Secondary | ICD-10-CM | POA: Diagnosis not present

## 2018-03-05 DIAGNOSIS — R45851 Suicidal ideations: Secondary | ICD-10-CM | POA: Diagnosis present

## 2018-03-05 DIAGNOSIS — M6281 Muscle weakness (generalized): Secondary | ICD-10-CM | POA: Diagnosis not present

## 2018-03-05 DIAGNOSIS — Z7401 Bed confinement status: Secondary | ICD-10-CM | POA: Diagnosis not present

## 2018-03-05 DIAGNOSIS — Z810 Family history of intellectual disabilities: Secondary | ICD-10-CM | POA: Diagnosis not present

## 2018-03-05 DIAGNOSIS — F323 Major depressive disorder, single episode, severe with psychotic features: Secondary | ICD-10-CM | POA: Diagnosis present

## 2018-03-05 DIAGNOSIS — E063 Autoimmune thyroiditis: Secondary | ICD-10-CM | POA: Diagnosis present

## 2018-03-05 DIAGNOSIS — G9341 Metabolic encephalopathy: Secondary | ICD-10-CM | POA: Diagnosis not present

## 2018-03-05 DIAGNOSIS — G9340 Encephalopathy, unspecified: Secondary | ICD-10-CM | POA: Diagnosis not present

## 2018-03-05 DIAGNOSIS — G9349 Other encephalopathy: Secondary | ICD-10-CM | POA: Diagnosis not present

## 2018-03-05 DIAGNOSIS — Z90710 Acquired absence of both cervix and uterus: Secondary | ICD-10-CM | POA: Diagnosis not present

## 2018-03-05 DIAGNOSIS — I10 Essential (primary) hypertension: Secondary | ICD-10-CM

## 2018-03-05 DIAGNOSIS — G20 Parkinson's disease: Secondary | ICD-10-CM | POA: Diagnosis not present

## 2018-03-05 DIAGNOSIS — M2550 Pain in unspecified joint: Secondary | ICD-10-CM | POA: Diagnosis not present

## 2018-03-05 DIAGNOSIS — D519 Vitamin B12 deficiency anemia, unspecified: Secondary | ICD-10-CM | POA: Diagnosis not present

## 2018-03-05 DIAGNOSIS — E039 Hypothyroidism, unspecified: Secondary | ICD-10-CM | POA: Diagnosis present

## 2018-03-05 DIAGNOSIS — R2689 Other abnormalities of gait and mobility: Secondary | ICD-10-CM | POA: Diagnosis not present

## 2018-03-05 DIAGNOSIS — Z6834 Body mass index (BMI) 34.0-34.9, adult: Secondary | ICD-10-CM | POA: Diagnosis not present

## 2018-03-05 DIAGNOSIS — Z880 Allergy status to penicillin: Secondary | ICD-10-CM | POA: Diagnosis not present

## 2018-03-05 DIAGNOSIS — Z79899 Other long term (current) drug therapy: Secondary | ICD-10-CM | POA: Diagnosis not present

## 2018-03-05 DIAGNOSIS — R440 Auditory hallucinations: Secondary | ICD-10-CM | POA: Diagnosis present

## 2018-03-05 DIAGNOSIS — N3090 Cystitis, unspecified without hematuria: Secondary | ICD-10-CM | POA: Diagnosis present

## 2018-03-05 DIAGNOSIS — Z86010 Personal history of colonic polyps: Secondary | ICD-10-CM | POA: Diagnosis not present

## 2018-03-05 DIAGNOSIS — F0391 Unspecified dementia with behavioral disturbance: Secondary | ICD-10-CM | POA: Diagnosis not present

## 2018-03-05 DIAGNOSIS — B9620 Unspecified Escherichia coli [E. coli] as the cause of diseases classified elsewhere: Secondary | ICD-10-CM | POA: Diagnosis present

## 2018-03-05 DIAGNOSIS — E079 Disorder of thyroid, unspecified: Secondary | ICD-10-CM | POA: Diagnosis present

## 2018-03-05 DIAGNOSIS — I639 Cerebral infarction, unspecified: Secondary | ICD-10-CM | POA: Diagnosis present

## 2018-03-05 DIAGNOSIS — Z79890 Hormone replacement therapy (postmenopausal): Secondary | ICD-10-CM | POA: Diagnosis not present

## 2018-03-05 DIAGNOSIS — E785 Hyperlipidemia, unspecified: Secondary | ICD-10-CM | POA: Diagnosis present

## 2018-03-05 DIAGNOSIS — Z7189 Other specified counseling: Secondary | ICD-10-CM | POA: Diagnosis not present

## 2018-03-05 DIAGNOSIS — G929 Unspecified toxic encephalopathy: Secondary | ICD-10-CM | POA: Diagnosis present

## 2018-03-05 DIAGNOSIS — R2681 Unsteadiness on feet: Secondary | ICD-10-CM | POA: Diagnosis not present

## 2018-03-05 LAB — RAPID URINE DRUG SCREEN, HOSP PERFORMED
AMPHETAMINES: NOT DETECTED
BARBITURATES: NOT DETECTED
Benzodiazepines: NOT DETECTED
COCAINE: NOT DETECTED
OPIATES: NOT DETECTED
Tetrahydrocannabinol: NOT DETECTED

## 2018-03-05 LAB — COMPREHENSIVE METABOLIC PANEL
ALBUMIN: 3.5 g/dL (ref 3.5–5.0)
ALT: 21 U/L (ref 0–44)
AST: 31 U/L (ref 15–41)
Alkaline Phosphatase: 73 U/L (ref 38–126)
Anion gap: 10 (ref 5–15)
BUN: 20 mg/dL (ref 8–23)
CO2: 22 mmol/L (ref 22–32)
CREATININE: 1.09 mg/dL — AB (ref 0.44–1.00)
Calcium: 8.5 mg/dL — ABNORMAL LOW (ref 8.9–10.3)
Chloride: 108 mmol/L (ref 98–111)
GFR calc Af Amer: 54 mL/min — ABNORMAL LOW (ref >60)
GFR calc non Af Amer: 46 mL/min — ABNORMAL LOW (ref >60)
GLUCOSE: 86 mg/dL (ref 70–99)
POTASSIUM: 4.1 mmol/L (ref 3.5–5.1)
SODIUM: 140 mmol/L (ref 135–145)
Total Bilirubin: 0.7 mg/dL (ref 0.3–1.2)
Total Protein: 6 g/dL — ABNORMAL LOW (ref 6.5–8.1)

## 2018-03-05 LAB — CBC WITH DIFFERENTIAL/PLATELET
Abs Immature Granulocytes: 0 10*3/uL (ref 0.0–0.1)
Basophils Absolute: 0 10*3/uL (ref 0.0–0.1)
Basophils Relative: 1 %
EOS ABS: 0.1 10*3/uL (ref 0.0–0.7)
EOS PCT: 1 %
HEMATOCRIT: 37.8 % (ref 36.0–46.0)
HEMOGLOBIN: 12 g/dL (ref 12.0–15.0)
Immature Granulocytes: 0 %
LYMPHS ABS: 2.7 10*3/uL (ref 0.7–4.0)
LYMPHS PCT: 38 %
MCH: 28.8 pg (ref 26.0–34.0)
MCHC: 31.7 g/dL (ref 30.0–36.0)
MCV: 90.6 fL (ref 78.0–100.0)
MONOS PCT: 9 %
Monocytes Absolute: 0.6 10*3/uL (ref 0.1–1.0)
Neutro Abs: 3.7 10*3/uL (ref 1.7–7.7)
Neutrophils Relative %: 51 %
Platelets: 190 10*3/uL (ref 150–400)
RBC: 4.17 MIL/uL (ref 3.87–5.11)
RDW: 13.6 % (ref 11.5–15.5)
WBC: 7.1 10*3/uL (ref 4.0–10.5)

## 2018-03-05 LAB — ECHOCARDIOGRAM COMPLETE
Height: 63 in
Weight: 3149.93 oz

## 2018-03-05 LAB — URINALYSIS, ROUTINE W REFLEX MICROSCOPIC
BILIRUBIN URINE: NEGATIVE
Bacteria, UA: NONE SEEN
Glucose, UA: NEGATIVE mg/dL
KETONES UR: NEGATIVE mg/dL
NITRITE: NEGATIVE
PROTEIN: NEGATIVE mg/dL
Specific Gravity, Urine: 1.013 (ref 1.005–1.030)
pH: 5 (ref 5.0–8.0)

## 2018-03-05 LAB — SODIUM, URINE, RANDOM: Sodium, Ur: 74 mmol/L

## 2018-03-05 LAB — CREATININE, URINE, RANDOM: CREATININE, URINE: 46.81 mg/dL

## 2018-03-05 MED ORDER — GADOBENATE DIMEGLUMINE 529 MG/ML IV SOLN
10.0000 mL | Freq: Once | INTRAVENOUS | Status: AC | PRN
Start: 2018-03-05 — End: 2018-03-05
  Administered 2018-03-05: 8 mL via INTRAVENOUS

## 2018-03-05 MED ORDER — ASPIRIN 300 MG RE SUPP: 300.0000 mg | Freq: Every day | RECTAL | Status: DC

## 2018-03-05 MED ORDER — CALCIUM CARBONATE-VITAMIN D 500-200 MG-UNIT PO TABS
1.0000 | ORAL_TABLET | Freq: Every day | ORAL | Status: DC
  Administered 2018-03-05 – 2018-03-18 (×14): 1 via ORAL
  Filled 2018-03-05 (×14): qty 1

## 2018-03-05 MED ORDER — ACETAMINOPHEN 650 MG RE SUPP: 650.0000 mg | Freq: Four times a day (QID) | RECTAL | Status: DC | PRN

## 2018-03-05 MED ORDER — HEPARIN SODIUM (PORCINE) 5000 UNIT/ML IJ SOLN
5000.0000 [IU] | Freq: Three times a day (TID) | INTRAMUSCULAR | Status: DC
  Administered 2018-03-05 – 2018-03-08 (×9): 5000 [IU] via SUBCUTANEOUS
  Filled 2018-03-05 (×11): qty 1

## 2018-03-05 MED ORDER — BISACODYL 5 MG PO TBEC
5.0000 mg | DELAYED_RELEASE_TABLET | Freq: Every day | ORAL | Status: DC | PRN
  Administered 2018-03-07: 5 mg via ORAL
  Filled 2018-03-05 (×2): qty 1

## 2018-03-05 MED ORDER — VITAMIN B-12 1000 MCG PO TABS
500.0000 ug | ORAL_TABLET | Freq: Every day | ORAL | Status: DC
  Administered 2018-03-05 – 2018-03-18 (×14): 500 ug via ORAL
  Filled 2018-03-05 (×14): qty 1

## 2018-03-05 MED ORDER — SENNOSIDES-DOCUSATE SODIUM 8.6-50 MG PO TABS: 1.0000 | ORAL_TABLET | Freq: Every evening | ORAL | Status: DC | PRN

## 2018-03-05 MED ORDER — ASPIRIN 81 MG PO CHEW: 81.0000 mg | CHEWABLE_TABLET | Freq: Every day | ORAL | Status: DC

## 2018-03-05 MED ORDER — QUETIAPINE FUMARATE 100 MG PO TABS
100.0000 mg | ORAL_TABLET | Freq: Every day | ORAL | Status: DC
  Administered 2018-03-05 – 2018-03-17 (×13): 100 mg via ORAL
  Filled 2018-03-05: qty 2
  Filled 2018-03-05 (×3): qty 1
  Filled 2018-03-05: qty 2
  Filled 2018-03-05: qty 1
  Filled 2018-03-05: qty 2
  Filled 2018-03-05: qty 1
  Filled 2018-03-05 (×4): qty 2
  Filled 2018-03-05 (×4): qty 1
  Filled 2018-03-05: qty 2
  Filled 2018-03-05 (×2): qty 1
  Filled 2018-03-05 (×3): qty 2
  Filled 2018-03-05 (×3): qty 1
  Filled 2018-03-05: qty 2

## 2018-03-05 MED ORDER — ACETAMINOPHEN 325 MG PO TABS
650.0000 mg | ORAL_TABLET | Freq: Four times a day (QID) | ORAL | Status: DC | PRN
  Administered 2018-03-16: 650 mg via ORAL
  Filled 2018-03-05: qty 2

## 2018-03-05 MED ORDER — ATORVASTATIN CALCIUM 20 MG PO TABS
40.0000 mg | ORAL_TABLET | Freq: Every day | ORAL | Status: DC
  Administered 2018-03-05 – 2018-03-11 (×7): 40 mg via ORAL
  Filled 2018-03-05: qty 4
  Filled 2018-03-05 (×2): qty 2
  Filled 2018-03-05: qty 4
  Filled 2018-03-05 (×2): qty 2
  Filled 2018-03-05 (×2): qty 4
  Filled 2018-03-05: qty 2
  Filled 2018-03-05 (×3): qty 4
  Filled 2018-03-05 (×2): qty 2

## 2018-03-05 MED ORDER — ASPIRIN 325 MG PO TABS
325.0000 mg | ORAL_TABLET | Freq: Every day | ORAL | Status: DC
  Administered 2018-03-05 – 2018-03-11 (×7): 325 mg via ORAL
  Filled 2018-03-05 (×7): qty 1

## 2018-03-05 MED ORDER — STROKE: EARLY STAGES OF RECOVERY BOOK
Freq: Once | Status: AC
  Administered 2018-03-05: 10:00:00

## 2018-03-05 MED ORDER — ONDANSETRON HCL 4 MG PO TABS: 4.0000 mg | ORAL_TABLET | Freq: Four times a day (QID) | ORAL | Status: DC | PRN

## 2018-03-05 MED ORDER — SODIUM CHLORIDE 0.9 % IV SOLN
INTRAVENOUS | Status: AC
  Administered 2018-03-05: 16:00:00 via INTRAVENOUS

## 2018-03-05 MED ORDER — LEVOTHYROXINE SODIUM 200 MCG PO TABS
200.0000 ug | ORAL_TABLET | Freq: Every day | ORAL | Status: DC
  Administered 2018-03-06 – 2018-03-18 (×13): 200 ug via ORAL
  Filled 2018-03-05: qty 2
  Filled 2018-03-05 (×5): qty 1
  Filled 2018-03-05 (×7): qty 2
  Filled 2018-03-05 (×4): qty 1
  Filled 2018-03-05: qty 2
  Filled 2018-03-05 (×4): qty 1
  Filled 2018-03-05 (×3): qty 2
  Filled 2018-03-05: qty 1

## 2018-03-05 MED ORDER — ONDANSETRON HCL 4 MG/2ML IJ SOLN: 4.0000 mg | Freq: Four times a day (QID) | INTRAMUSCULAR | Status: DC | PRN

## 2018-03-05 MED ORDER — DONEPEZIL HCL 10 MG PO TABS
10.0000 mg | ORAL_TABLET | Freq: Every day | ORAL | Status: DC
  Administered 2018-03-05 – 2018-03-17 (×13): 10 mg via ORAL
  Filled 2018-03-05 (×7): qty 1
  Filled 2018-03-05: qty 2
  Filled 2018-03-05 (×5): qty 1

## 2018-03-05 MED ORDER — SODIUM CHLORIDE 0.9 % IV SOLN
INTRAVENOUS | Status: AC
  Administered 2018-03-05: 01:00:00 via INTRAVENOUS

## 2018-03-05 MED ORDER — AMLODIPINE BESYLATE 5 MG PO TABS
2.5000 mg | ORAL_TABLET | Freq: Every day | ORAL | Status: DC
  Administered 2018-03-06 – 2018-03-18 (×13): 2.5 mg via ORAL
  Filled 2018-03-05 (×13): qty 1

## 2018-03-05 NOTE — Consult Note (Signed)
Requesting Physician: Dr.  Sherry Ruffing    Chief Complaint: paranoa, delusions  History obtained from: Patient and Chart     HPI:                                                                                                                                       Rachael Carpenter is an 83 y.o. female past medical history of hypertension, hypothyroidism,  recently was seen in Dr. Cathren Laine clinic for worsening hallucinations without significant memory loss-work-up revealed elevated anti- thyroid bodies, B12 deficiency with elevated MMA and homocystine and UTI. She was started on 1g methylprednisone however received only 1 dose.  MRI brain was ordered as outpatient but was still pending.  She presents to the ER brought by her daughter for worsening paranoia, hallucinations stating that daughter's ex-husband was trying to get into the house.  He also told her daughter she wanted to end her life, however denies this in the ER.  On assessment patient appears to be pleasant, she is is aware why she is in the hospital.    Past Medical History:  Diagnosis Date  . Colon polyp    in cecum on July 2010 Colonoscopy  . Dementia   . Hyperlipidemia   . Hypothyroidism   . Intestinal malrotation    on CT scan 2010 in [place]  . Neck pain    L; pt denies as of 02/24/18  . Thyroid disease     Past Surgical History:  Procedure Laterality Date  . 4th finger flexor sheath ganglion Left 02/2006  . CHOLECYSTECTOMY  1990s  . [place]  . RIGHT FOOT MOLE EXCISION Right 2011  . scalp lesion removal  2019  . SHOULDER RECONSTRUCTION Right 07/2001  . SHOULDER SURGERY Right   . TOTAL ABDOMINAL HYSTERECTOMY     83 y.o.    Family History  Problem Relation Age of Onset  . COPD Sister   . Cancer Daughter   . Cancer Brother   . Cancer Brother   . Dementia Neg Hx    Social History:  reports that she has never smoked. She has never used smokeless tobacco. She reports that she does not drink  alcohol or use drugs.  Allergies:  Allergies  Allergen Reactions  . Penicillins     REACTION: swelling    Medications:  I reviewed home medications   ROS:                                                                                                                                     14 systems reviewed and negative except above    Examination:                                                                                                      General: Appears well-developed and well-nourished.  Psych: Affect appropriate to situation Eyes: No scleral injection HENT: No OP obstrucion Head: Normocephalic.  Cardiovascular: Normal rate and regular rhythm.  Respiratory: Effort normal and breath sounds normal to anterior ascultation GI: Soft.  No distension. There is no tenderness.  Skin: WDI    Neurological Examination Mental Status: Alert, oriented, thought content appropriate.  Speech fluent without evidence of aphasia. Able to follow 3 step commands with some difficulty.  Recent memory appears to be intact. Unable to subtract serial 7 except for 1st one,  some confabulations ( states she has seen me before).   Cranial Nerves: II: Visual fields grossly normal,  III,IV, VI: ptosis not present, extra-ocular motions intact bilaterally, pupils equal, round, reactive to light and accommodation V,VII: smile symmetric, facial light touch sensation normal bilaterally VIII: hearing normal bilaterally IX,X: uvula rises symmetrically XI: bilateral shoulder shrug XII: midline tongue extension Motor: Right : Upper extremity   5/5    Left:     Upper extremity   5/5  Lower extremity   5/5     Lower extremity   5/5 Tone and bulk:normal tone throughout; no atrophy noted Sensory: Pinprick and light touch intact throughout, bilaterally Deep Tendon Reflexes: 2+ and symmetric  throughout Plantars: Right: downgoing   Left: downgoing Cerebellar: normal finger-to-nose, normal rapid alternating movements and normal heel-to-shin test Gait: normal gait and station     Lab Results: Basic Metabolic Panel: Recent Labs  Lab 03/04/18 1542  NA 138  K 4.4  CL 105  CO2 26  GLUCOSE 104*  BUN 29*  CREATININE 1.39*  CALCIUM 9.0    CBC: Recent Labs  Lab 03/04/18 1542  WBC 9.1  HGB 12.5  HCT 39.7  MCV 90.6  PLT 238    Coagulation Studies: No results for input(s): LABPROT, INR in the last 72 hours.  Imaging: No results found.   I have reviewed the above imaging    ASSESSMENT AND PLAN  83 y.o. female past medical history of hypertension, hypothyroidism,  recently was seen in Dr. Cathren Laine clinic for worsening hallucinations without significant memory loss. Her presentation would be atypical for Hashimoto's encephalopathy normal  reflexes are normal TSH. However, anti-TPO and thyroglobulin antibodies positive and still a possibility. Also, has B12 deficiency.    Paranoa, Hallucinations Encephalopathy  Plan MRI Brain w/wo contrast Seroquel 12.5mg  QHS Continue B12 replacement Hold steroids for now, may restart depending on MRI findings Routine EEG tomorrow May need LP if MRI abnormal       Sushanth Aroor [facility] Pager Number [phone]

## 2018-03-05 NOTE — Progress Notes (Signed)
[facility] PROGRESS NOTE  Rachael Carpenter ERX:[id number] DOB: 1934-12-28 DOA: 03/04/2018 PCP: [name], MD   Narrative: 83 year old female--subacute new onset of memory problems hallucinations poor insight which is worsened over the past year-associated with poor insight, auditory hallucinations, forgetting appointments and usual habit routine-some wandering at night Patient of [name] neurology-last seen 8/14 Rx for UTI-to be pansensitive E. coli treated with Bactrim Started on 2000 mg B12- also found to have thyroperoxidase antibodies 192, thyroglobulin antibodies 91 Started as an outpatient 8/14 on high-dose steroids 1000 mg X 3 days as a trial to treat Hashimoto's encephalitis Other medical problems recurrent cough followed by [name] apparent?  Upper airway syndrome) HTN HLD     A & Plan Multifactorial encephalopathy-MRI shows a small left frontal cortical infarct unclear if this would cause degree of confusion-nonetheless would work-up with stroke protocol in order set Determine per neurology steroid dosing for possible Hashimoto's encephalopathy also treat AKI Have asked psych to see patient--DDx steroids vs hashimoto--not likely 2/2 uti as Rx already  Hashimoto's encephalopathy/thyroiditis-relatively rare-defer to neurology if this is something we would treat with steroids  E. coli cystitis-appropriately treated with a course of Bactrim in the outpatient setting-no further work-up  Dementia rapidly progressive see above-reasonable to continue Aricept 10 mg-Seroquel 100 every afternoon-would discontinue Claritin  Possible B12 deficiency-treated in the outpatient setting-continue 500 mcg daily of the same may benefit from IV administration--per neuro   loveneox  DVT prophylaxis: above  Code Status: full   Family Communication:  None present   Disposition Plan: ip    [name], MD  [facility] Direct contact: [phone] --Via amion app  OR  --[email or web address]; password [email or web address]  7PM-7AM contact night coverage as above 03/05/2018, 7:40 AM  LOS: 0 days   Consultants:  neuro  Procedures:  psych  Antimicrobials:   none  Interval history/Subjective: Awake alert cabn tell year, not month-confabulates a little-talks about ex-son in law and him being at their house  Objective:  Vitals:  Vitals:   03/05/18 0128 03/05/18 0406  BP: (!) 149/56 (!) 119/52  Pulse: 79 67  Resp: 18 18  Temp: 98 F (36.7 C) 97.8 F (36.6 C)  SpO2: 99%     Exam:  . Calm-pleasant confused to some degree . eomi ncat power 5/5 UE's . abd soft nt nd no rebound no guard . cta b . LE power intact . No rash . Psych-a little confused   I have personally reviewed the following:   Labs:  Bun/ Creat 29/1.3--->?20/1.09  Wbc 7  Imaging studies:  MRI shows small acute CVA  Medical tests:  no   Test discussed with performing physician:  no  Decision to obtain old records: Y  Review and summation of old records:  Y  Scheduled Meds: . amLODipine  2.5 mg Oral Daily  . aspirin  81 mg Oral Daily  . atorvastatin  40 mg Oral q1800  . B-12  500 mcg Oral Daily  . CITRACAL PLUS  1 tablet Oral Daily  . donepezil  10 mg Oral QHS  . heparin  5,000 Units Subcutaneous Q8H  . levothyroxine  200 mcg Oral Daily  . QUEtiapine  100 mg Oral See admin instructions   Continuous Infusions: . sodium chloride 90 mL/hr at 03/05/18 0101    Principal Problem:   Encephalopathy Active Problems:   Essential hypertension   Hallucinations   Hypothyroidism   AKI (acute kidney injury) ([place])   Suicidal ideation   B12 deficiency  LOS: 0 days

## 2018-03-05 NOTE — Evaluation (Signed)
Physical Therapy Evaluation Patient Details Name: Rachael Carpenter MRN: [id number] DOB: [birth year] Today's Date: 03/05/2018   History of Present Illness  Pt is an 83 y/o female admitted secondary to confusion, suicidal thoughts. Pt with an incidental finding on her MRI: 38mm acute L frontal cortical infarct. PMH including but not limited to HTN, hypothyroidism and dementia.    Clinical Impression  Pt presented supine in bed with HOB elevated, awake and willing to participate in therapy session. Prior to admission, pt reported that she was independent with all functional mobility and ADLs. Pt reported that she lives with her husband and has 24/7 supervision/assistance if needed. Pt currently able to perform bed mobility with modified independence, transfers with min guard and ambulated in hallway with min guard. Pt would continue to benefit from skilled physical therapy services at this time while admitted and after d/c to address the below listed limitations in order to improve overall safety and independence with functional mobility.    Follow Up Recommendations Home health PT;Supervision/Assistance - 24 hour (Inpatient Psych admission per Psychiatry Consult)    Equipment Recommendations  None recommended by PT    Recommendations for Other Services       Precautions / Restrictions Precautions Precautions: Other (comment)(suicide precautions) Restrictions Weight Bearing Restrictions: No      Mobility  Bed Mobility Overal bed mobility: Modified Independent                Transfers Overall transfer level: Needs assistance Equipment used: None Transfers: Sit to/from Stand Sit to Stand: Min guard         General transfer comment: min guard for safety  Ambulation/Gait Ambulation/Gait assistance: Min guard Gait Distance (Feet): 100 Feet Assistive device: None Gait Pattern/deviations: Step-through pattern;Decreased stride length;Drifts right/left Gait velocity:  decreased Gait velocity interpretation: 1.31 - 2.62 ft/sec, indicative of limited community ambulator General Gait Details: pt with mild instability but no overt LOB or need for physical assistance, min guard for safety  Stairs            Wheelchair Mobility    Modified Rankin (Stroke Patients Only) Modified Rankin (Stroke Patients Only) Pre-Morbid Rankin Score: No symptoms Modified Rankin: No significant disability     Balance Overall balance assessment: Needs assistance Sitting-balance support: Feet supported Sitting balance-Leahy Scale: Good     Standing balance support: No upper extremity supported Standing balance-Leahy Scale: Fair                               Pertinent Vitals/Pain Pain Assessment: No/denies pain    Home Living Family/patient expects to be discharged to:: Private residence Living Arrangements: Spouse/significant other Available Help at Discharge: Family;Available 24 hours/day Type of Home: House Home Access: Stairs to enter   [employer] of Steps: 1 Home Layout: One level Home Equipment: Wheelchair - manual;Crutches      Prior Function Level of Independence: Independent               Hand Dominance        Extremity/Trunk Assessment   Upper Extremity Assessment Upper Extremity Assessment: Overall WFL for tasks assessed    Lower Extremity Assessment Lower Extremity Assessment: Overall WFL for tasks assessed       Communication   Communication: No difficulties  Cognition Arousal/Alertness: Awake/alert Behavior During Therapy: WFL for tasks assessed/performed Overall Cognitive Status: History of cognitive impairments - at baseline  General Comments: pt with dementia at baseline; however, able to follow commands and have appropriate conversation      General Comments      Exercises     Assessment/Plan    PT Assessment Patient needs continued PT  services  PT Problem List Decreased balance;Decreased mobility;Decreased coordination       PT Treatment Interventions DME instruction;Gait training;Therapeutic activities;Stair training;Functional mobility training;Therapeutic exercise;Balance training;Neuromuscular re-education;Patient/family education    PT Goals (Current goals can be found in the Care Plan section)  Acute Rehab PT Goals Patient Stated Goal: return home PT Goal Formulation: With patient Time For Goal Achievement: 03/19/18 Potential to Achieve Goals: Good    Frequency Min 3X/week   Barriers to discharge        Co-evaluation               AM-PAC PT "6 Clicks" Daily Activity  Outcome Measure Difficulty turning over in bed (including adjusting bedclothes, sheets and blankets)?: None Difficulty moving from lying on back to sitting on the side of the bed? : None Difficulty sitting down on and standing up from a chair with arms (e.g., wheelchair, bedside commode, etc,.)?: A Little Help needed moving to and from a bed to chair (including a wheelchair)?: None Help needed walking in hospital room?: A Little Help needed climbing 3-5 steps with a railing? : A Little 6 Click Score: 21    End of Session   Activity Tolerance: Patient tolerated treatment well Patient left: in chair;with call bell/phone within reach;with nursing/sitter in room Nurse Communication: Mobility status PT Visit Diagnosis: Other abnormalities of gait and mobility (R26.89)    Time: 3128-1188 PT Time Calculation (min) (ACUTE ONLY): 19 min   Charges:   PT Evaluation $PT Eval Moderate Complexity: [place], [place], [place] 03/05/2018, 4:23 PM

## 2018-03-05 NOTE — Progress Notes (Signed)
Pt admitted from the Ed at around 0130, for hallucinations,  sitter at bedside, suicide precautions in place, call light in reach, oriented to room Loretha Stapler, RN

## 2018-03-05 NOTE — Progress Notes (Signed)
Pt left for MRI.

## 2018-03-05 NOTE — Progress Notes (Signed)
Pt back from MRI 

## 2018-03-05 NOTE — Progress Notes (Addendum)
Same day follow up  Pt seen and examined. Agree with the HPI documented with Dr. root. I also spoke with Dr. Jaynee Eagles on the phone, to get some clarifications on her treatment till date. [name] had recommended the steroids but it is unclear how many doses she is taken. For that reason we reached out to her daughter and she said that she had only taken 1 IV dose of the IV steroids and has not taken oral steroids.  [name] and I discussed the course of her symptoms, and 1 of the differentials that definitely should be considered given that she was brought in for suicidal ideation and worsening of paranoid hallucinations/delusions, that could be an underlying psychiatric component to her progressive cognitive derangements.  Physical exam: Comfortably laying in bed.  No acute distress.  Sitter at bedside. S1-S2 heard regular rate rhythm Chest clear to auscultation Extremities: Warm well perfused Neurological exam Awake, alert, oriented to place and self.  Could not tell me the month.  Was aware of the year 2019.  Could not name the president.  Kept on saying Bush or Reagan. Poor attention concentration. 3 out of 3 registration.  0 out of 3 recall. Pupils equal round reactive light, extra ocular movements intact, visual fields full, face symmetric, palate midline, tongue midline, shoulder shrug intact. Symmetric 5/5 all over. No focal sensory deficits Gait testing deferred at this time.  Impression 83 year old past history of hypertension hypothyroidism seen for myriad of symptoms including forgetfulness/memory loss and hallucinations. Found to have multiple lab abnormalities at the time on early August namely UTI, B12 deficiency and extremely high thyroid antibodies concerning for Hashimoto's encephalitis. Started on steroids-got 1 dose of high-dose IV steroids. Brought into the emergency room for suicidal ideation as well as paranoia and hallucination worsening. At this time, I think it  is reasonable to discontinue steroids as they might have contributed to the acute psychosis, which is a known side effect of high-dose steroids. Also to consider at this time is an underlying psychiatric etiology leading to pseudodementia. Work-up in the emergency room included an MRI of the brain which incidentally showed a 5 mm punctate ischemic left frontal cortical infarct.  I do not think that has any bearing on her presentation, but we will pursue stroke work-up.  Recommendations -do not continue IV steroids. - Check urinalysis - Treat UTI if positive UA. - Replenish B12 - Agree with Seroquel 12.5 at bedtime - 2D echocardiogram, fasting lipid panel, hemoglobin A 1C.  Frequent neurochecks. -Telemetry - Psychiatry consultation - Routine EEG -We will hold off on the spinal tap-no indication for an inpatient spinal tap at this time. -IVIG can be considered if there is concern for any other autoimmune etiology-steroids would not be great given her hallucinations and probably might worsen her paranoia and hallucinations. Neurology will follow with you.  -- Amie Portland, MD [facility] Pager: [phone] If 7pm to 7am, please call on call as listed on [facility]ION.

## 2018-03-05 NOTE — Procedures (Signed)
Date of recording 03/05/2018  Referring physician Rory Percy  Reason for the study Altered mental status  Technical Digital EEG recording using 10-20 international electrode system  Description of the recording Posterior dominant rhythm is 8-9 Hz symmetrical reactive and well sustained Non-REM stage II sleep was obtained, vertex transients and sleep spindles were seen Epileptiform features were not seen during this recording.  Impression EEG is normal in awake and sleep state.

## 2018-03-05 NOTE — Progress Notes (Signed)
EEG Completed; Results Pending  

## 2018-03-05 NOTE — Consult Note (Signed)
[place] [facility] Consult   Reason for Consult: ''pseudodementia, SI.'' Referring Physician:  Dr. [name] Patient Identification: [name] MRN:  [id number] Principal Diagnosis: Psychosis, paranoid [facility]) Diagnosis:   Patient Active Problem List   Diagnosis Date Noted  . Encephalopathy [G93.40] 03/05/2018  . Hypothyroidism [E03.9] 03/05/2018  . AKI (acute kidney injury) ([place]) [N17.9] 03/05/2018  . Suicidal ideation [R45.851] 03/05/2018  . B12 deficiency [E53.8] 03/05/2018  . Toxic encephalopathy [G92] 03/05/2018  . Psychosis, paranoid ([place]) [F22] 03/05/2018  . Hallucinations [R44.3] 02/28/2018  . HYPERLIPIDEMIA [E78.5] 07/11/2010  . Essential hypertension [I10] 07/11/2010  . DYSPNEA [R06.02] 07/11/2010  . COUGH [R05] 07/11/2010    Total Time spent with patient: 45 minutes  Subjective:   Rachael Carpenter is a 83 y.o. female patient admitted with hallucinations.  HPI: Patient with history of Hypertension, hyperlipidemia, thyroid disease, and currently in the midst of a neurologic work-up for autoimmune encephalitis who presents with depression, tearfulness, worsening hallucinations, paranoia and a possible suicidal threat. Collateral information obtained from patient's daughter and husband revealed that patient has been dealing with delusions and psychosis for more than 6 months. Patient reports that she has been hearing the voices of her ex-son in law at her home(ex-son in law and were divorced 20 years ago), thinks people are sneaking into her house damaging her stuffs and has been seeing insects and animals walking around in her yard. Patient lives with her husband who reports that he has not seen animals walking around their home or people breaking in. Daughter reports that she brought her mother to the hospital for full psychiatric evaluation after her mother called her crying, upset and stated that she could no longer deal with her current circumstances. Per family,  patient was seen by a neurologist several days ago who was concerned about Hashimoto's encephalopathy versus a progressive Lewy body dementia. Currently, patient reports depressive symptoms, psychosis, delusions but denies suicidal thoughts.  Past Psychiatric History: denies  Risk to Self:  yes Risk to Others:  denies Prior Inpatient Therapy:  none Prior Outpatient Therapy:  none  Past Medical History:  Past Medical History:  Diagnosis Date  . Colon polyp    in cecum on July 2010 Colonoscopy  . Dementia   . Hyperlipidemia   . Hypothyroidism   . Intestinal malrotation    on CT scan 2010 in [place]  . Neck pain    L; pt denies as of 02/24/18  . Thyroid disease     Past Surgical History:  Procedure Laterality Date  . 4th finger flexor sheath ganglion Left 02/2006  . CHOLECYSTECTOMY  1990s  . [place]  . RIGHT FOOT MOLE EXCISION Right 2011  . scalp lesion removal  2019  . SHOULDER RECONSTRUCTION Right 07/2001  . SHOULDER SURGERY Right   . TOTAL ABDOMINAL HYSTERECTOMY     83 y.o.   Family History:  Family History  Problem Relation Age of Onset  . COPD Sister   . Cancer Daughter   . Cancer Brother   . Cancer Brother   . Dementia Neg Hx    Family Psychiatric  History:  Social History:  Social History   Substance and Sexual Activity  Alcohol Use Never  . Frequency: Never     Social History   Substance and Sexual Activity  Drug Use Never    Social History   Socioeconomic History  . Marital status: Married    Spouse name: Not on file  . Number  of children: 3  . 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 Highest education level: High school graduate  Occupational History  . Not on file  Social Needs  . Financial resource strain: Not on file  . Food insecurity:    Worry: Not on file    Inability: Not on file  . Transportation needs:    Medical: Not on file    Non-medical: Not on file  Tobacco Use  . Smoking status:  Never Smoker  . Smokeless tobacco: Never Used  Substance and Sexual Activity  . Alcohol use: Never    Frequency: Never  . Drug use: Never  . Sexual activity: Not on file  Lifestyle  . Physical activity:    Days per week: Not on file    Minutes per session: Not on file  . Stress: Not on file  Relationships  . Social connections:    Talks on phone: Not on file    Gets together: Not on file    Attends religious service: Not on file    Active member of club or organization: Not on file    Attends meetings of clubs or organizations: Not on file    Relationship status: Not on file  Other Topics Concern  . Not on file  Social History Narrative   Lives at home with her husband   Right handed   Additional Social History:    Allergies:   Allergies  Allergen Reactions  . Penicillins     REACTION: swelling    Labs:  Results for orders placed or performed during the hospital encounter of 03/04/18 (from the past 48 hour(s))  Comprehensive metabolic panel     Status: Abnormal   Collection Time: 03/04/18  3:42 PM  Result Value Ref Range   Sodium 138 135 - 145 mmol/L   Potassium 4.4 3.5 - 5.1 mmol/L   Chloride 105 98 - 111 mmol/L   CO2 26 22 - 32 mmol/L   Glucose, Bld 104 (H) 70 - 99 mg/dL   BUN 29 (H) 8 - 23 mg/dL   Creatinine, Ser 1.39 (H) 0.44 - 1.00 mg/dL   Calcium 9.0 8.9 - 10.3 mg/dL   Total Protein 6.8 6.5 - 8.1 g/dL   Albumin 4.1 3.5 - 5.0 g/dL   AST 42 (H) 15 - 41 U/L   ALT 22 0 - 44 U/L   Alkaline Phosphatase 83 38 - 126 U/L   Total Bilirubin 0.8 0.3 - 1.2 mg/dL   GFR calc non Af Amer 35 (L) >60 mL/min   GFR calc Af Amer 40 (L) >60 mL/min    Comment: (NOTE) The eGFR has been calculated using the CKD EPI equation. This calculation has not been validated in all clinical situations. eGFR's persistently <60 mL/min signify possible Chronic Kidney Disease.    Anion gap 7 5 - 15    Comment: Performed at [facility] [address], [name], [name] [address]   Ethanol     Status: None   Collection Time: 03/04/18  3:42 PM  Result Value Ref Range   Alcohol, Ethyl (B) <10 <10 mg/dL    Comment: (NOTE) Lowest detectable limit for serum alcohol is 10 mg/dL. For medical purposes only. Performed at [facility], [place] [address], [place], [place] [address]   Salicylate level     Status: None   Collection Time: 03/04/18  3:42 PM  Result Value Ref Range   Salicylate Lvl <7.8 2.8 -  30.0 mg/dL    Comment: Performed at [facility], [place] [address] [place], [place] [address]  Acetaminophen level     Status: Abnormal   Collection Time: 03/04/18  3:42 PM  Result Value Ref Range   Acetaminophen (Tylenol), Serum <10 (L) 10 - 30 ug/mL    Comment: (NOTE) Therapeutic concentrations vary significantly. A range of 10-30 ug/mL  may be an effective concentration for many patients. However, some  are best treated at concentrations outside of this range. Acetaminophen concentrations >150 ug/mL at 4 hours after ingestion  and >50 ug/mL at 12 hours after ingestion are often associated with  toxic reactions. Performed at [facility], [place] [address], [place], [place] [address]   cbc     Status: None   Collection Time: 03/04/18  3:42 PM  Result Value Ref Range   WBC 9.1 4.0 - 10.5 K/uL   RBC 4.38 3.87 - 5.11 MIL/uL   Hemoglobin 12.5 12.0 - 15.0 g/dL   HCT 39.7 36.0 - 46.0 %   MCV 90.6 78.0 - 100.0 fL   MCH 28.5 26.0 - 34.0 pg   MCHC 31.5 30.0 - 36.0 g/dL   RDW 13.8 11.5 - 15.5 %   Platelets 238 150 - 400 K/uL    Comment: Performed at [facility] [address], [place], [place] [address]  Sodium, urine, random     Status: None   Collection Time: 03/05/18 12:14 AM  Result Value Ref Range   Sodium, Ur 74 mmol/L    Comment: Performed at [facility] [address] [place], [place] [address]  Creatinine, urine, random     Status: None   Collection Time: 03/05/18 12:14 AM  Result Value Ref Range   Creatinine, Urine 46.81  mg/dL    Comment: Performed at [facility] [address], [place], [place] [address]  Comprehensive metabolic panel     Status: Abnormal   Collection Time: 03/05/18  4:42 AM  Result Value Ref Range   Sodium 140 135 - 145 mmol/L   Potassium 4.1 3.5 - 5.1 mmol/L   Chloride 108 98 - 111 mmol/L   CO2 22 22 - 32 mmol/L   Glucose, Bld 86 70 - 99 mg/dL   BUN 20 8 - 23 mg/dL   Creatinine, Ser 1.09 (H) 0.44 - 1.00 mg/dL   Calcium 8.5 (L) 8.9 - 10.3 mg/dL   Total Protein 6.0 (L) 6.5 - 8.1 g/dL   Albumin 3.5 3.5 - 5.0 g/dL   AST 31 15 - 41 U/L   ALT 21 0 - 44 U/L   Alkaline Phosphatase 73 38 - 126 U/L   Total Bilirubin 0.7 0.3 - 1.2 mg/dL   GFR calc non Af Amer 46 (L) >60 mL/min   GFR calc Af Amer 54 (L) >60 mL/min    Comment: (NOTE) The eGFR has been calculated using the CKD EPI equation. This calculation has not been validated in all clinical situations. eGFR's persistently <60 mL/min signify possible Chronic Kidney Disease.    Anion gap 10 5 - 15    Comment: Performed at [facility] [address], [place], [place] [address]  CBC WITH DIFFERENTIAL     Status: None   Collection Time: 03/05/18  4:42 AM  Result Value Ref Range   WBC 7.1 4.0 - 10.5 K/uL   RBC 4.17 3.87 - 5.11 MIL/uL   Hemoglobin 12.0 12.0 - 15.0 g/dL  HCT 37.8 36.0 - 46.0 %   MCV 90.6 78.0 - 100.0 fL   MCH 28.8 26.0 - 34.0 pg   MCHC 31.7 30.0 - 36.0 g/dL   RDW 13.6 11.5 - 15.5 %   Platelets 190 150 - 400 K/uL   Neutrophils Relative % 51 %   Neutro Abs 3.7 1.7 - 7.7 K/uL   Lymphocytes Relative 38 %   Lymphs Abs 2.7 0.7 - 4.0 K/uL   Monocytes Relative 9 %   Monocytes Absolute 0.6 0.1 - 1.0 K/uL   Eosinophils Relative 1 %   Eosinophils Absolute 0.1 0.0 - 0.7 K/uL   Basophils Relative 1 %   Basophils Absolute 0.0 0.0 - 0.1 K/uL   Immature Granulocytes 0 %   Abs Immature Granulocytes 0.0 0.0 - 0.1 K/uL    Comment: Performed at [facility] [address], [place], [place] [address]  Rapid  urine drug screen (hospital performed)     Status: None   Collection Time: 03/05/18  2:27 PM  Result Value Ref Range   Opiates NONE DETECTED NONE DETECTED   Cocaine NONE DETECTED NONE DETECTED   Benzodiazepines NONE DETECTED NONE DETECTED   Amphetamines NONE DETECTED NONE DETECTED   Tetrahydrocannabinol NONE DETECTED NONE DETECTED   Barbiturates NONE DETECTED NONE DETECTED    Comment: (NOTE) DRUG SCREEN FOR MEDICAL PURPOSES ONLY.  IF CONFIRMATION IS NEEDED FOR ANY PURPOSE, NOTIFY LAB WITHIN 5 DAYS. LOWEST DETECTABLE LIMITS FOR URINE DRUG SCREEN Drug Class                     Cutoff (ng/mL) Amphetamine and metabolites    1000 Barbiturate and metabolites    200 Benzodiazepine                 676 Tricyclics and metabolites     300 Opiates and metabolites        300 Cocaine and metabolites        300 THC                            50 Performed at [facility], [place] [address] [place], [place] [address]   Urinalysis, Routine w reflex microscopic     Status: Abnormal   Collection Time: 03/05/18  2:27 PM  Result Value Ref Range   Color, Urine YELLOW YELLOW   APPearance HAZY (A) CLEAR   Specific Gravity, Urine 1.013 1.005 - 1.030   pH 5.0 5.0 - 8.0   Glucose, UA NEGATIVE NEGATIVE mg/dL   Hgb urine dipstick SMALL (A) NEGATIVE   Bilirubin Urine NEGATIVE NEGATIVE   Ketones, ur NEGATIVE NEGATIVE mg/dL   Protein, ur NEGATIVE NEGATIVE mg/dL   Nitrite NEGATIVE NEGATIVE   Leukocytes, UA MODERATE (A) NEGATIVE   RBC / HPF 0-5 0 - 5 RBC/hpf   WBC, UA 11-20 0 - 5 WBC/hpf   Bacteria, UA NONE SEEN NONE SEEN   Squamous Epithelial / LPF 0-5 0 - 5   Mucus PRESENT     Comment: Performed at [facility], [place] [address], [place], [place] [address]    Current Facility-Administered Medications  Medication Dose Route Frequency Provider Last Rate Last Dose  . acetaminophen (TYLENOL) tablet 650 mg  650 mg Oral Q6H PRN Opyd, Ilene Qua, MD       Or  . acetaminophen (TYLENOL) suppository  650 mg  650 mg Rectal Q6H PRN Opyd, Ilene Qua,  MD      . amLODipine (NORVASC) tablet 2.5 mg  2.5 mg Oral Daily Opyd, Ilene Qua, MD      . aspirin suppository 300 mg  300 mg Rectal Daily Amie Portland, MD       Or  . aspirin tablet 325 mg  325 mg Oral Daily Amie [place], MD   325 mg at 03/05/18 0943  . atorvastatin (LIPITOR) tablet 40 mg  40 mg Oral q1800 Opyd, Ilene Qua, MD      . bisacodyl (DULCOLAX) EC tablet 5 mg  5 mg Oral Daily PRN Opyd, Ilene Qua, MD      . calcium-vitamin D (OSCAL WITH D) 500-200 MG-UNIT per tablet 1 tablet  1 tablet Oral Q breakfast Opyd, Ilene Qua, MD   1 tablet at 03/05/18 0948  . donepezil (ARICEPT) tablet 10 mg  10 mg Oral QHS Opyd, Ilene Qua, MD      . heparin injection 5,000 Units  5,000 Units Subcutaneous Q8H Opyd, Ilene Qua, MD      . levothyroxine (SYNTHROID, LEVOTHROID) tablet 200 mcg  200 mcg Oral Daily Opyd, Ilene Qua, MD      . ondansetron (ZOFRAN) tablet 4 mg  4 mg Oral Q6H PRN Opyd, Ilene Qua, MD       Or  . ondansetron (ZOFRAN) injection 4 mg  4 mg Intravenous Q6H PRN Opyd, Ilene Qua, MD      . QUEtiapine (SEROQUEL) tablet 100 mg  100 mg Oral Q1500 Opyd, Ilene Qua, MD      . senna-docusate (Senokot-S) tablet 1 tablet  1 tablet Oral QHS PRN Opyd, Ilene Qua, MD      . vitamin B-12 (CYANOCOBALAMIN) tablet 500 mcg  500 mcg Oral Daily Opyd, Ilene Qua, MD   500 mcg at 03/05/18 3704    Musculoskeletal: Strength & Muscle Tone: within normal limits Gait & Station: not tested Patient leans: N/A  Psychiatric Specialty Exam: Physical Exam  Psychiatric: She has a normal mood and affect. Her speech is normal. Judgment normal. She is actively hallucinating. Thought content is paranoid and delusional. Cognition and memory are impaired.    Review of Systems  Constitutional: Negative.   HENT: Negative.   Gastrointestinal: Negative.   Genitourinary: Negative.   Skin: Negative.   Neurological: Negative.   Endo/Heme/Allergies: Negative.    Psychiatric/Behavioral: Positive for hallucinations and memory loss.    Blood pressure 131/60, pulse 80, temperature 98.1 F (36.7 C), temperature source Oral, resp. rate 18, height '5\' 3"'  (1.6 m), weight 89.3 kg, SpO2 97 %.Body mass index is 34.87 kg/m.  General Appearance: Casual  Eye Contact:  Good  Speech:  Clear and Coherent  Volume:  Normal  Mood:  Dysphoric  Affect:  Constricted  Thought Process:  Coherent and Linear  Orientation:  Other:  only to place and person, not to time  Thought Content:  Hallucinations: Auditory Visual and Paranoid Ideation  Suicidal Thoughts:  No  Homicidal Thoughts:  No  Memory:  Immediate;   Fair Recent;   Poor Remote;   Good  Judgement:  Poor  Insight:  Shallow  Psychomotor Activity:  Psychomotor Retardation  Concentration:  Concentration: Fair and Attention Span: Fair  Recall:  [employer] of Knowledge:  Fair  Language:  Good  Akathisia:  No  Handed:  Right  AIMS (if indicated):     Assets:  Communication Skills Desire for Improvement Social Support  ADL's: marginal  Cognition:  Impaired,  Mild  Sleep:   fair  Treatment Plan Summary: JERZY CROTTEAU is a 83 y.o. female with a past medical history significant for hypertension, hyperlipidemia, thyroid disease, and currently in the midst of a neurologic work-up for autoimmune encephalitis who presents with depression, worsening hallucinations and paranoia.  Recommendations: -Continue 1:1 sitter for safety -Continue Seroquel 100 mg daily at 1600 for psychosis/hallucination -Social worker consult to facilitate Geriatric inpatient psychiatric placement after patient is medically cleared. -Avoid Haldol if  Lewy body dementia is diagnosed. -Avoid steroid in patient with florid hallucinations/delusions. -Psychiatric service signing out at this point.  Disposition: Recommend psychiatric Inpatient admission when medically cleared. Supportive therapy provided about ongoing  stressors. Re-consult psych service as needed  Corena Pilgrim, MD 03/05/2018 3:32 PM

## 2018-03-05 NOTE — Progress Notes (Signed)
Brief note:  Spoke with Daughter Cephus Shelling. Per daughter she was brought to the ED yesterday because patient was having increasing depression and yesterday while speaking with her daughter became upset and said " i'm ready to end it all". At this time daughter and patient's husband Teneka Malmberg) decided to call EMS. Patient agreed to let Lattie Haw drive her to the hospital for evaluation and thus patient arrived via personal vehicle instead of ambulance.  Per daughter patient has not been sleeping well for several days, and thus everyone is getting less and less sleep. Patient is having increasing paranoia and hallucinations that family feels is getting worse daily. Has even started to become aggressive at times.  Daughter states that she believes her mom received only the 1 dose of IV steroids and was going to start the PO steroid taper, but came to the hospital before she ever took any PO steroids. Tried to verify this with Husband,but he did not answer the phone.  Laurey Morale, MSN, NP-C [facility] [phone]

## 2018-03-05 NOTE — Progress Notes (Signed)
  Echocardiogram 2D Echocardiogram has been performed.  Rachael Carpenter 03/05/2018, 3:29 PM

## 2018-03-05 NOTE — H&P (Signed)
History and Physical    Rachael Carpenter YJE:[id number] DOB: [birth year] DOA: 03/04/2018  PCP: [name], MD   Patient coming from: Home   Chief Complaint: Hallucinations, paranoia, suicidal ideation   HPI: Rachael Carpenter is a 83 y.o. female with medical history significant for hypothyroidism, dementia, hypertension, recent diagnosis of B12 deficiency started on injections, and recent UTI for which she has completed a course of Bactrim, now presenting to the emergency department for evaluation of hallucinations, paranoia, and reported suicidal ideations.  Patient has been undergoing a work-up with her outpatient neurologist for hallucinations, memory loss, and paranoia that have progressed over the past several weeks.  She was treated for UTI and has been undergoing treatment for B12 deficiency.  Thyroglobulin and thyroid peroxidase antibodies were elevated, raising concern for Hashimoto encephalopathy, and the patient was started on high-dose steroids in the neurology clinic.  She now presents for evaluation of worsening hallucinations and paranoia, and had reportedly indicated to her daughter that she wanted to end her own life.  Patient reports that her ex son-in-law has been coming to their house at odd hours, states that she sees him, sees his truck, and hears them speaking.  No one else at the house sees or hears this.  Patient denies any suicidal ideation in the emergency department and seems perplexed as to why her daughter would have reported this.  Patient denies any headache, change in vision or hearing, focal numbness or weakness, chest pain, abdominal pain, dysuria, or flank pain.  ED Course: Upon arrival to the ED, patient is found to be afebrile, saturating well on room air, and with vitals otherwise normal.  Chemistry panel is notable for creatinine 1.39, up from 0.14 earlier in the month.  CBC is unremarkable.  Ethanol level and salicylate level are undetectable.  Urinalysis and  UDS are pending.  Neurology was consulted by the ED physician and recommended medical admission for further inpatient work-up, including MRI brain, prior to psychiatric consultation.  Review of Systems:  All other systems reviewed and apart from HPI, are negative.  Past Medical History:  Diagnosis Date  . Colon polyp    in cecum on July 2010 Colonoscopy  . Dementia   . Hyperlipidemia   . Hypothyroidism   . Intestinal malrotation    on CT scan 2010 in [place]  . Neck pain    L; pt denies as of 02/24/18  . Thyroid disease     Past Surgical History:  Procedure Laterality Date  . 4th finger flexor sheath ganglion Left 02/2006  . CHOLECYSTECTOMY  1990s  . [place]  . RIGHT FOOT MOLE EXCISION Right 2011  . scalp lesion removal  2019  . SHOULDER RECONSTRUCTION Right 07/2001  . SHOULDER SURGERY Right   . TOTAL ABDOMINAL HYSTERECTOMY     83 y.o.     reports that she has never smoked. She has never used smokeless tobacco. She reports that she does not drink alcohol or use drugs.  Allergies  Allergen Reactions  . Penicillins     REACTION: swelling    Family History  Problem Relation Age of Onset  . COPD Sister   . Cancer Daughter   . Cancer Brother   . Cancer Brother   . Dementia Neg Hx      Prior to Admission medications   Medication Sig Start Date End Date Taking? Authorizing Provider  amLODipine (NORVASC) 2.5 MG tablet Take 2.5 mg by mouth daily.  [provider]  aspirin 81 MG chewable tablet Chew 81 mg by mouth daily.    [provider]  atorvastatin (LIPITOR) 40 MG tablet Take 40 mg by mouth daily.    [provider]  Cyanocobalamin (B-12 PO) Take 500 mcg by mouth daily.    [provider]  donepezil (ARICEPT) 10 MG tablet Take 1 tablet (10 mg total) by mouth at bedtime. 02/24/18   Melvenia Beam, MD  levothyroxine (SYNTHROID, LEVOTHROID) 200 MCG tablet Take 200 mcg by mouth daily.  02/21/18   [provider]  loratadine (CLARITIN) 10 MG tablet Take 10 mg by mouth daily.    [provider]  losartan (COZAAR) 100 MG tablet Take 100 mg by mouth daily.    [provider]  Multiple Minerals-Vitamins (CITRACAL PLUS) TABS Take 1 tablet by mouth daily.    [provider]  predniSONE (DELTASONE) 20 MG tablet Take in AM with food.80mg (4 pills)daily x 7 days, 60mg (3 pill) x 7 day, 40mg (2 pill) x 7 day, 20mg (1 pill) x 7 day, 10mg (1/2 pill) x 7 day. 03/03/18   Melvenia Beam, MD  QUEtiapine (SEROQUEL) 100 MG tablet Take 100 mg by mouth. Every afternoon    [provider]  sulfamethoxazole-trimethoprim (BACTRIM DS,SEPTRA DS) 800-160 MG tablet Take 1 tablet by mouth 2 (two) times daily. 02/26/18   Melvenia Beam, MD  zolpidem (AMBIEN) 10 MG tablet Take 5-10 mg by mouth at bedtime as needed for sleep.    [provider]    Physical Exam: Vitals:   03/04/18 1815 03/04/18 1905 03/04/18 2029 03/04/18 2228  BP: (!) 119/50 (!) 124/49 (!) 130/52   Pulse: 76 74 88 87  Resp: 16 15 17    Temp: 98.7 F (37.1 C)     TempSrc: Oral     SpO2: 99% 97% 99% 100%      Constitutional: NAD, calm  Eyes: PERTLA, lids and conjunctivae normal ENMT: Mucous membranes are moist. Posterior pharynx clear of any exudate or lesions.   Neck: normal, supple, no masses, no thyromegaly Respiratory: clear to auscultation bilaterally, no wheezing, no crackles. Normal respiratory effort.    Cardiovascular: S1 & S2 heard, regular rate and rhythm. No extremity edema. Abdomen: No distension, no tenderness, no masses palpated. Bowel sounds normal.  Musculoskeletal: no clubbing / cyanosis. No joint deformity upper and lower extremities.   Skin: no significant rashes, lesions, ulcers. Warm, dry, well-perfused. Neurologic: CN 2-12 grossly intact. Sensation intact, DTR normal. Strength 5/5 in all 4 limbs.  Psychiatric: Alert and oriented to person, place, and situation. Calm,  cooperative.     Labs on Admission: I have personally reviewed following labs and imaging studies  CBC: Recent Labs  Lab 03/04/18 1542  WBC 9.1  HGB 12.5  HCT 39.7  MCV 90.6  PLT 300   Basic Metabolic Panel: Recent Labs  Lab 03/04/18 1542  NA 138  K 4.4  CL 105  CO2 26  GLUCOSE 104*  BUN 29*  CREATININE 1.39*  CALCIUM 9.0   GFR: Estimated Creatinine Clearance: 35.2 mL/min (A) (by C-G formula based on SCr of 1.39 mg/dL (H)). Liver Function Tests: Recent Labs  Lab 03/04/18 1542  AST 42*  ALT 22  ALKPHOS 83  BILITOT 0.8  PROT 6.8  ALBUMIN 4.1   No results for input(s): LIPASE, AMYLASE in the last 168 hours. No results for input(s): AMMONIA in the last 168 hours. Coagulation Profile: No results for input(s): INR, PROTIME in the last  168 hours. Cardiac Enzymes: No results for input(s): CKTOTAL, CKMB, CKMBINDEX, TROPONINI in the last 168 hours. BNP (last 3 results) No results for input(s): PROBNP in the last 8760 hours. HbA1C: No results for input(s): HGBA1C in the last 72 hours. CBG: No results for input(s): GLUCAP in the last 168 hours. Lipid Profile: No results for input(s): CHOL, HDL, LDLCALC, TRIG, CHOLHDL, LDLDIRECT in the last 72 hours. Thyroid Function Tests: No results for input(s): TSH, T4TOTAL, FREET4, T3FREE, THYROIDAB in the last 72 hours. Anemia Panel: No results for input(s): VITAMINB12, FOLATE, FERRITIN, TIBC, IRON, RETICCTPCT in the last 72 hours. Urine analysis:    Component Value Date/Time   COLORURINE STRAW (A) 12/26/2017 1330   APPEARANCEUR Clear 02/24/2018 1505   LABSPEC 1.004 (L) 12/26/2017 1330   PHURINE 6.0 12/26/2017 1330   GLUCOSEU Negative 02/24/2018 1505   HGBUR SMALL (A) 12/26/2017 1330   BILIRUBINUR Negative 02/24/2018 1505   KETONESUR NEGATIVE 12/26/2017 1330   PROTEINUR Negative 02/24/2018 1505   PROTEINUR NEGATIVE 12/26/2017 1330   NITRITE Negative 02/24/2018 1505   NITRITE NEGATIVE 12/26/2017 1330   LEUKOCYTESUR  2+ (A) 02/24/2018 1505   Sepsis Labs: @LABRCNTIP (procalcitonin:4,lacticidven:4) ) Recent Results (from the past 240 hour(s))  Microscopic Examination     Status: Abnormal   Collection Time: 02/24/18  3:05 PM  Result Value Ref Range Status   WBC, UA >30 (A) 0 - 5 /hpf Final   RBC, UA 0-2 0 - 2 /hpf Final   Epithelial Cells (non renal) 0-10 0 - 10 /hpf Final   Casts None seen None seen /lpf Final   Mucus, UA Present Not Estab. Final   Bacteria, UA Moderate (A) None seen/Few Final  Culture, Urine     Status: Abnormal   Collection Time: 02/24/18  3:41 PM  Result Value Ref Range Status   Urine Culture, Routine Final report (A)  Final   Organism ID, Bacteria Escherichia coli (A)  Final    Comment: Greater than 100,000 colony forming units per mL Cefazolin <=4 ug/mL Cefazolin with an MIC <=16 predicts susceptibility to the oral agents cefaclor, cefdinir, cefpodoxime, cefprozil, cefuroxime, cephalexin, and loracarbef when used for therapy of uncomplicated urinary tract infections due to E. coli, Klebsiella pneumoniae, and Proteus mirabilis.    Antimicrobial Susceptibility Comment  Final    Comment:       ** S = Susceptible; I = Intermediate; R = Resistant **                    P = Positive; N = Negative             MICS are expressed in micrograms per mL    Antibiotic                 RSLT#1    RSLT#2    RSLT#3    RSLT#4 Amoxicillin/Clavulanic Acid    S Ampicillin                     R Cefepime                       S Ceftriaxone                    S Cefuroxime                     S Ciprofloxacin  S Ertapenem                      S Gentamicin                     S Imipenem                       S Levofloxacin                   S Meropenem                      S Nitrofurantoin                 S Piperacillin/Tazobactam        S Tetracycline                   S Tobramycin                     S Trimethoprim/Sulfa             S      Radiological Exams on  Admission: No results found.  EKG: Not performed.   Assessment/Plan  1. Encephalopathy   - Patient has been undergoing workup with her outpatient neurologist for memory loss and hallucinations, rapidly progressing over past several weeks   - She has completed a course of Bactrim for UTI, was noted to have b12 deficiency and started on injections, and was found to have elevated thyroperoxidase and thyroglobulin antibodies concerning for Hashimoto encephalitis and was started on high-dose steroids  - She now presents for evaluation of worsening hallucinations and paranoia, and had reportedly expressed a desire to end her life (pt denies this)  - Inpatient neurology was consulted by ED physician and recommended further inpatient workup, including MRI, prior to psychiatry consultation  - UA and UDS are pending, MRI ordered and not yet performed, will follow-up on any further neurology recommendations, continue supportive care    2. Suicidal ideation  - Patient's daughter had reported that patient expressed desire to end her life  - The patient adamantly denies this  - Continue to monitor, continue sitter for now   3. Mild AKI  - SCr is 1.39 on admission, up from 1.04 a week ago  - Likely a prerenal azotemia in setting of decreased intake, but has also just completed a course of Bactrim  - Check urine chemistries, start a gentle IVF hydration, hold ARB, repeat chem panel in am   4. Hypothyroidism - TSH normal in June  - Continue Synthroid   5. Hypertension  - BP at goal  - Continue Norvasc  - Hold ARB until renal function stabilizes    6. B12 deficiency  - Recently started on B12 injections and oral supplementation, will continue    DVT prophylaxis: sq heparin  Code Status: Full  Family Communication: Discussed with patient  Consults called: Neurology  Admission status: Observation    Vianne Bulls, MD [facility] Pager [phone]  If 7PM-7AM, please contact  night-coverage [email or web address] Password [facility]  03/05/2018, 12:14 AM

## 2018-03-06 ENCOUNTER — Encounter (HOSPITAL_COMMUNITY): Payer: Self-pay | Admitting: *Deleted

## 2018-03-06 ENCOUNTER — Inpatient Hospital Stay (HOSPITAL_COMMUNITY): Payer: Medicare Other

## 2018-03-06 LAB — LIPID PANEL
Cholesterol: 159 mg/dL (ref 0–200)
HDL: 31 mg/dL — ABNORMAL LOW (ref >40)
LDL CALC: 84 mg/dL (ref 0–99)
TRIGLYCERIDES: 220 mg/dL — AB (ref <150)
Total CHOL/HDL Ratio: 5.1 RATIO
VLDL: 44 mg/dL — ABNORMAL HIGH (ref 0–40)

## 2018-03-06 LAB — HEMOGLOBIN A1C
Hgb A1c MFr Bld: 5.1 % (ref 4.8–5.6)
Mean Plasma Glucose: 99.67 mg/dL

## 2018-03-06 NOTE — Evaluation (Signed)
Occupational Therapy Evaluation Patient Details Name: Rachael Carpenter MRN: [id number] DOB: 1935-05-12 Today's Date: 03/06/2018    History of Present Illness Pt is an 83 y/o female admitted secondary to confusion, suicidal thoughts. Pt with an incidental finding on her MRI: 22mm acute L frontal cortical infarct. PMH including but not limited to HTN, hypothyroidism and dementia.   Clinical Impression   Pt admitted with above.  She demonstrates cognitive deficits including impaired memory, as well as deficits with sequencing and problem solving with familiar task ADL in novel setting/situation - given her h/o dementia, anticipate this is likely baseline, and if she were in her home, performing these tasks with her own routine, she would likely be mod I with ADLs. Currently, she requires supervision due to above.  No acute OT needs identified at this time.   Note, that plan is for pt to transition to [facility].     Follow Up Recommendations  No OT follow up;Supervision/Assistance - 24 hour(plan for [facility] admission )    Equipment Recommendations  None recommended by OT    Recommendations for Other Services       Precautions / Restrictions Precautions Precautions: Other (comment)(suicide precautions) Restrictions Weight Bearing Restrictions: No      Mobility Bed Mobility Overal bed mobility: Modified Independent                Transfers Overall transfer level: Needs assistance Equipment used: None Transfers: Sit to/from Stand Sit to Stand: Supervision         General transfer comment: min guard for safety    Balance Overall balance assessment: Mild deficits observed, not formally tested             Standing balance comment: Pt required UE support when donning/doffing pants in standing and when attempting to wash feet in standing - demonstrates good safety awareness with this                            ADL either performed or assessed with clinical judgement    ADL Overall ADL's : Needs assistance/impaired Eating/Feeding: Independent   Grooming: Wash/dry hands;Wash/dry face;Oral care;Brushing hair;Supervision/safety;Standing   Upper Body Bathing: Supervision/ safety;Standing   Lower Body Bathing: Supervison/ safety Lower Body Bathing Details (indicate cue type and reason): standing in shower  Upper Body Dressing : Supervision/safety;Standing   Lower Body Dressing: Supervision/safety;Sit to/from stand   Toilet Transfer: Supervision/safety;Ambulation;Comfort height toilet   Toileting- Clothing Manipulation and Hygiene: Supervision/safety;Sit to/from stand   Tub/ Shower Transfer: Walk-in shower;Supervision/safety;Ambulation   Functional mobility during ADLs: Supervision/safety General ADL Comments: Pt required min cuing for sequencing and problem solving when washing hair.  She reports she typically tub bathes and washes her hair in sink, so current set up situation is novel for her      Vision Baseline Vision/History: No visual deficits Patient Visual Report: No change from baseline Vision Assessment?: No apparent visual deficits     Perception Perception Perception Tested?: Yes   Praxis Praxis Praxis tested?: Within functional limits    Pertinent Vitals/Pain Pain Assessment: No/denies pain     Hand Dominance Right   Extremity/Trunk Assessment Upper Extremity Assessment Upper Extremity Assessment: Overall WFL for tasks assessed   Lower Extremity Assessment Lower Extremity Assessment: Overall WFL for tasks assessed   Cervical / Trunk Assessment Cervical / Trunk Assessment: Normal   Communication Communication Communication: No difficulties   Cognition Arousal/Alertness: Awake/alert Behavior During Therapy: WFL for tasks assessed/performed Overall Cognitive  Status: History of cognitive impairments - at baseline                                 General Comments: pt with dementia at baseline; however,  able to follow commands and have appropriate conversation   General Comments  Pt noted to repeat self occasionally.      Exercises     Shoulder Instructions      Home Living Family/patient expects to be discharged to:: Private residence Living Arrangements: Spouse/significant other Available Help at Discharge: Family;Available 24 hours/day Type of Home: House Home Access: Stairs to enter [employer] of Steps: 1   Home Layout: One level     Bathroom Shower/Tub: Tub/shower unit;Walk-in Psychologist, prison and probation services: Standard     Home Equipment: Wheelchair - manual;Crutches          Prior Functioning/Environment Level of Independence: Independent        Comments: Pt reports she tub bathes         OT Problem List: Decreased coordination      OT Treatment/Interventions:      OT Goals(Current goals can be found in the care plan section) Acute Rehab OT Goals Patient Stated Goal: "I just want to go home"  OT Goal Formulation: All assessment and education complete, DC therapy  OT Frequency:     Barriers to D/C:            Co-evaluation              AM-PAC PT "6 Clicks" Daily Activity     Outcome Measure Help from another person eating meals?: None Help from another person taking care of personal grooming?: None Help from another person toileting, which includes using toliet, bedpan, or urinal?: None Help from another person bathing (including washing, rinsing, drying)?: None Help from another person to put on and taking off regular upper body clothing?: None Help from another person to put on and taking off regular lower body clothing?: None 6 Click Score: 24   End of Session Nurse Communication: Mobility status  Activity Tolerance: Patient tolerated treatment well Patient left: in bed;with call bell/phone within reach;with nursing/sitter in room  OT Visit Diagnosis: Cognitive communication deficit (R41.841) Symptoms and signs involving  cognitive functions: Cerebral infarction                Time: 9038-3338 OT Time Calculation (min): 27 min Charges:  OT General Charges $OT Visit: 1 Visit OT Evaluation $OT Eval Moderate Complexity: 1 Mod OT Treatments $Self Care/Home Management : 8-22 mins  [facility], OTR/L [phone]   Lucille Passy M 03/06/2018, 3:10 PM

## 2018-03-06 NOTE — Evaluation (Signed)
Speech Language Pathology Evaluation Patient Details Name: Rachael Carpenter MRN: [id number] DOB: [birth year] Today's Date: 03/06/2018 Time: 5916-3846 SLP Time Calculation (min) (ACUTE ONLY): 25 min  Problem List:  Patient Active Problem List   Diagnosis Date Noted  . Encephalopathy 03/05/2018  . Hypothyroidism 03/05/2018  . AKI (acute kidney injury) ([place]) 03/05/2018  . Suicidal ideation 03/05/2018  . B12 deficiency 03/05/2018  . Toxic encephalopathy 03/05/2018  . Psychosis, paranoid ([place]) 03/05/2018  . Hallucinations 02/28/2018  . HYPERLIPIDEMIA 07/11/2010  . Essential hypertension 07/11/2010  . DYSPNEA 07/11/2010  . COUGH 07/11/2010   Past Medical History:  Past Medical History:  Diagnosis Date  . Colon polyp    in cecum on July 2010 Colonoscopy  . Dementia   . Hyperlipidemia   . Hypothyroidism   . Intestinal malrotation    on CT scan 2010 in [place]  . Neck pain    L; pt denies as of 02/24/18  . Thyroid disease    Past Surgical History:  Past Surgical History:  Procedure Laterality Date  . 4th finger flexor sheath ganglion Left 02/2006  . CHOLECYSTECTOMY  1990s  . [place]  . RIGHT FOOT MOLE EXCISION Right 2011  . scalp lesion removal  2019  . SHOULDER RECONSTRUCTION Right 07/2001  . SHOULDER SURGERY Right   . TOTAL ABDOMINAL HYSTERECTOMY     83 y.o.   HPI:  Pt is an 83 y/o female admitted secondary to confusion, suicidal thoughts. Pt with an incidental finding on her MRI: 51mm acute L frontal cortical infarct. PMH including but not limited to HTN, hypothyroidism and dementia.    Assessment / Plan / Recommendation Clinical Impression  Patient presents with a mild cognitive-linguistic impairment, but cannot determine if she is at her baseline (baseline cognitive deficits reported secondary to dementia diagnosis) as no family was present during this evaluation. Patient's primary areas of difficulty were in memory and awareness. She exhibited  decreased recall of new information, decreased short term memory and memory retrieval. Patient became emotionally labile when speaking of members of her family. She would perseverate on topics and become somewhat tangential in conversation with clinician. Patient exhibited decreased awareness to premorbid and current deficits and would benefit from 24 hour supervision for her safety.     SLP Assessment  SLP Recommendation/Assessment: All further Speech Lanaguage Pathology  needs can be addressed in the next venue of care SLP Visit Diagnosis: Cognitive communication deficit (R41.841)    Follow Up Recommendations  Home health SLP;24 hour supervision/assistance    Frequency and Duration   N/A        SLP Evaluation Cognition  Overall Cognitive Status: No family/caregiver present to determine baseline cognitive functioning Arousal/Alertness: Awake/alert Orientation Level: Oriented to person;Oriented to place;Oriented to situation;Disoriented to time Attention: Selective Selective Attention: Appears intact Memory: Impaired Memory Impairment: Decreased short term memory;Decreased recall of new information;Retrieval deficit Decreased Short Term Memory: Verbal basic;Verbal complex Awareness: Impaired Awareness Impairment: Intellectual impairment Problem Solving: Appears intact Behaviors: Perseveration;Lability Safety/Judgment: Impaired       Comprehension  Auditory Comprehension Overall Auditory Comprehension: Appears within functional limits for tasks assessed    Expression Expression Primary Mode of Expression: Verbal Written Expression Dominant Hand: Right   Oral / Motor  Oral Motor/Sensory Function Overall Oral Motor/Sensory Function: Within functional limits   [place], MA, CCC-SLP 03/06/18 3:19 PM

## 2018-03-06 NOTE — Progress Notes (Signed)
Mr. Rachael Carpenter and his daughter Rachael Carpenter have claimed the patient's wallet from the security office; receipt is in the chart; wallet released from the security office to the spouse.

## 2018-03-06 NOTE — Progress Notes (Signed)
[facility] PROGRESS NOTE  Rachael Carpenter ZTI:[id number] DOB: 1935/06/22 DOA: 03/04/2018 PCP: [name], MD   Narrative: 83 year old female--subacute new onset of memory problems hallucinations poor insight which is worsened over the past year-associated with poor insight, auditory hallucinations, forgetting appointments and usual habit routine-some wandering at night Patient of [name] neurology-last seen 8/14 Rx for UTI-to be pansensitive E. coli treated with Bactrim Started on 2000 mg B12- also found to have thyroperoxidase antibodies 192, thyroglobulin antibodies 91 Started as an outpatient 8/14 on high-dose steroids 1000 mg X 3 days as a trial to treat Hashimoto's encephalitis  Other medical problems recurrent cough followed by [name] apparent?  Upper airway syndrome) HTN HLD     A & Plan Multifactorial encephalopathy-MRI not corresponding to clinical syndrome Appreciate input from psych-with hallucinations this encephalopathy is more likely psychiatric induced continue Seroquel at current dose  Hashimoto's encephalopathy/thyroiditis-relatively rare and controversial-no steroids at this juncture-would probably use other alternative agents as an outpatient and will need guidacne from Endocrinology--Will attempt to see if can be seen in house   E. coli cystitis-appropriately treated with a course of Bactrim in the outpatient setting-no further work-up  Dementia/pseudodementia with hallucinations-as above-reasonable to continue Aricept 10 mg-Seroquel 100 every afternoon-would discontinue Claritin  Possible B12 deficiency-treated in the outpatient setting-continue treatment as per neurology suspect may need this long-term   loveneox  DVT prophylaxis: above  Code Status: full   Family Communication:  None present   Disposition Plan: ip    [name], MD  [facility] Direct contact: [phone] --Via amion app OR  --[email or web address]; password [email or web address]  7PM-7AM  contact night coverage as above 03/06/2018, 10:08 AM  LOS: 1 day   Consultants:  neuro  Procedures:  psych  Antimicrobials:   none  Interval history/Subjective:  Awake alert seems to be more coherent-tolerating diet-just came back from some scan-seems a little upset at the prospect of going to facility No other overt symptoms--no reports of hallucinations  Objective:  Vitals:  Vitals:   03/06/18 0258 03/06/18 0831  BP: (!) 138/56 (!) 137/59  Pulse: 72 75  Resp: 19 16  Temp: 97.9 F (36.6 C) 98.1 F (36.7 C)  SpO2: 97% 97%    Exam:  . EOMI NCAT pleasant seems more appropriate today . Then becomes a little tangential about her husband etc. . Chest is clinically clear no added sound . Abdomen is soft nontender no rebound no guarding . No lower extremity edema . S1-S2 no murmur rub or gallop . Power is 5/5 .    I have personally reviewed the following:   Labs:    Imaging studies:  MRI shows small acute CVA  Medical tests:  no   Test discussed with performing physician:  no  Decision to obtain old records: Y  Review and summation of old records:  Y  Scheduled Meds: . amLODipine  2.5 mg Oral Daily  . aspirin  300 mg Rectal Daily   Or  . aspirin  325 mg Oral Daily  . atorvastatin  40 mg Oral q1800  . calcium-vitamin D  1 tablet Oral Q breakfast  . donepezil  10 mg Oral QHS  . heparin  5,000 Units Subcutaneous Q8H  . levothyroxine  200 mcg Oral Daily  . QUEtiapine  100 mg Oral Q1500  . vitamin B-12  500 mcg Oral Daily   Continuous Infusions:   Principal Problem:   Psychosis, paranoid (HCC) Active Problems:   Essential hypertension   Hallucinations  Encephalopathy   Hypothyroidism   AKI (acute kidney injury) ([place])   Suicidal ideation   B12 deficiency   Toxic encephalopathy   LOS: 1 day

## 2018-03-06 NOTE — Progress Notes (Signed)
Neurology Progress Note   S:// Seen and examined.  No acute changes overnight    O:// Current vital signs: BP (!) 138/56 (BP Location: Left Arm)   Pulse 72   Temp 97.9 F (36.6 C) (Oral)   Resp 19   Ht '5\' 3"'  (1.6 m)   Wt 89.3 kg   SpO2 97%   BMI 34.87 kg/m  Vital signs in last 24 hours: Temp:  [97.1 F (36.2 C)-98.1 F (36.7 C)] 97.9 F (36.6 C) (08/18 0258) Pulse Rate:  [72-92] 72 (08/18 0258) Resp:  [16-19] 19 (08/18 0258) BP: (98-152)/(50-81) 138/56 (08/18 0258) SpO2:  [96 %-100 %] 97 % (08/18 0258) Unchanged exam from before Comfortably laying in bed.  No acute distress.  Sitter at bedside. S1-S2 heard regular rate rhythm Chest clear to auscultation Extremities: Warm well perfused Neurological exam Awake, alert, oriented to place and self.  Could not tell me the month.  Was aware of the year 2019.  Could not name the president.  Kept on saying Bush or Reagan. Poor attention concentration. 3 out of 3 registration.  0 out of 3 recall. Pupils equal round reactive light, extra ocular movements intact, visual fields full, face symmetric, palate midline, tongue midline, shoulder shrug intact. Symmetric 5/5 all over. No focal sensory deficits Gait testing deferred at this time.  Medications  Current Facility-Administered Medications:  .  acetaminophen (TYLENOL) tablet 650 mg, 650 mg, Oral, Q6H PRN **OR** acetaminophen (TYLENOL) suppository 650 mg, 650 mg, Rectal, Q6H PRN, Opyd, Timothy S, MD .  amLODipine (NORVASC) tablet 2.5 mg, 2.5 mg, Oral, Daily, Opyd, Timothy S, MD .  aspirin suppository 300 mg, 300 mg, Rectal, Daily **OR** aspirin tablet 325 mg, 325 mg, Oral, Daily, Amie Portland, MD, 325 mg at 03/05/18 0943 .  atorvastatin (LIPITOR) tablet 40 mg, 40 mg, Oral, q1800, Opyd, Ilene Qua, MD, 40 mg at 03/05/18 1747 .  bisacodyl (DULCOLAX) EC tablet 5 mg, 5 mg, Oral, Daily PRN, Opyd, Ilene Qua, MD .  calcium-vitamin D (OSCAL WITH D) 500-200 MG-UNIT per tablet 1 tablet,  1 tablet, Oral, Q breakfast, Opyd, Ilene Qua, MD, 1 tablet at 03/05/18 0948 .  donepezil (ARICEPT) tablet 10 mg, 10 mg, Oral, QHS, Opyd, Timothy S, MD, 10 mg at 03/05/18 2234 .  heparin injection 5,000 Units, 5,000 Units, Subcutaneous, Q8H, Opyd, Ilene Qua, MD, 5,000 Units at 03/06/18 0622 .  levothyroxine (SYNTHROID, LEVOTHROID) tablet 200 mcg, 200 mcg, Oral, Daily, Opyd, Timothy S, MD .  ondansetron (ZOFRAN) tablet 4 mg, 4 mg, Oral, Q6H PRN **OR** ondansetron (ZOFRAN) injection 4 mg, 4 mg, Intravenous, Q6H PRN, Opyd, Timothy S, MD .  QUEtiapine (SEROQUEL) tablet 100 mg, 100 mg, Oral, Q1500, Opyd, Ilene Qua, MD, 100 mg at 03/05/18 1540 .  senna-docusate (Senokot-S) tablet 1 tablet, 1 tablet, Oral, QHS PRN, Opyd, Ilene Qua, MD .  vitamin B-12 (CYANOCOBALAMIN) tablet 500 mcg, 500 mcg, Oral, Daily, Opyd, Ilene Qua, MD, 500 mcg at 03/05/18 0942 Labs CBC    Component Value Date/Time   WBC 7.1 03/05/2018 0442   RBC 4.17 03/05/2018 0442   HGB 12.0 03/05/2018 0442   HGB 13.2 02/24/2018 1505   HCT 37.8 03/05/2018 0442   HCT 40.5 02/24/2018 1505   PLT 190 03/05/2018 0442   PLT 199 02/24/2018 1505   MCV 90.6 03/05/2018 0442   MCV 87 02/24/2018 1505   MCH 28.8 03/05/2018 0442   MCHC 31.7 03/05/2018 0442   RDW 13.6 03/05/2018 0442   RDW 13.3 02/24/2018 1505  LYMPHSABS 2.7 03/05/2018 0442   MONOABS 0.6 03/05/2018 0442   EOSABS 0.1 03/05/2018 0442   BASOSABS 0.0 03/05/2018 0442    CMP     Component Value Date/Time   NA 140 03/05/2018 0442   NA 139 02/24/2018 1505   K 4.1 03/05/2018 0442   CL 108 03/05/2018 0442   CO2 22 03/05/2018 0442   GLUCOSE 86 03/05/2018 0442   BUN 20 03/05/2018 0442   BUN 15 02/24/2018 1505   CREATININE 1.09 (H) 03/05/2018 0442   CALCIUM 8.5 (L) 03/05/2018 0442   PROT 6.0 (L) 03/05/2018 0442   PROT 7.0 02/24/2018 1505   ALBUMIN 3.5 03/05/2018 0442   ALBUMIN 4.4 02/24/2018 1505   AST 31 03/05/2018 0442   ALT 21 03/05/2018 0442   ALKPHOS 73 03/05/2018 0442    BILITOT 0.7 03/05/2018 0442   BILITOT 0.4 02/24/2018 1505   GFRNONAA 46 (L) 03/05/2018 0442   GFRAA 54 (L) 03/05/2018 0442  Normal hemoglobin A1c LDL 84 2D echocardiogram with no cardiac source of embolization identified.   Imaging I have reviewed images in epic and the results pertinent to this consultation are: MRI of the brain with a punctate left frontal stroke. Routine EEG was normal  Assessment:  83 year old woman with a past medical history of hypertension hypothyroidism seen for myriad of symptoms including forgetfulness, memory loss and hallucinations that have ongoing for almost a year.  Outpatient work-up revealed B12 deficiency is an extremely high thyroid antibodies concerning for Hashimoto's encephalitis.  Got 1 dose of high-dose IV steroids with increasing paranoia and suicidal ideation. MRI of the brain done that revealed a punctate left frontal stroke. I do not think at this time that the stroke is the causative agent for her problems.  All the stroke work-up has been done has been essentially unremarkable.  She might benefit from outpatient long-term cardiac monitoring. I would not recommend continuing steroids because of increasing psychotic symptoms. At this time, I think that she has an underlying psychiatric disorder causing pseudodementia which can explain her current clinical picture.  Impression: Acute left frontal punctate infarct-likely incidental Dementia-? Pseudodementia in the setting of psychiatric etiology Depression  Recommendations: Continue on antiplatelet, statin. Continue to replenish B12 Consider outpatient endocrine consultation for the thyroid disorder Further management per the primary team and psychiatry Should follow-up with [name] after discharge-in 2 to 4 weeks. Neurology will sign off at this time.  Please call with questions.   -- Amie Portland, MD [facility] Pager: [phone] If 7pm to 7am, please call on call  as listed on AMION.

## 2018-03-07 LAB — BASIC METABOLIC PANEL
ANION GAP: 7 (ref 5–15)
BUN: 21 mg/dL (ref 8–23)
CALCIUM: 8.7 mg/dL — AB (ref 8.9–10.3)
CO2: 28 mmol/L (ref 22–32)
Chloride: 104 mmol/L (ref 98–111)
Creatinine, Ser: 1.05 mg/dL — ABNORMAL HIGH (ref 0.44–1.00)
GFR calc Af Amer: 56 mL/min — ABNORMAL LOW (ref >60)
GFR, EST NON AFRICAN AMERICAN: 48 mL/min — AB (ref >60)
GLUCOSE: 85 mg/dL (ref 70–99)
Potassium: 4.2 mmol/L (ref 3.5–5.1)
SODIUM: 139 mmol/L (ref 135–145)

## 2018-03-07 LAB — CBC WITH DIFFERENTIAL/PLATELET
ABS IMMATURE GRANULOCYTES: 0 10*3/uL (ref 0.0–0.1)
BASOS ABS: 0 10*3/uL (ref 0.0–0.1)
BASOS PCT: 1 %
EOS ABS: 0.2 10*3/uL (ref 0.0–0.7)
Eosinophils Relative: 3 %
HCT: 37.8 % (ref 36.0–46.0)
Hemoglobin: 11.9 g/dL — ABNORMAL LOW (ref 12.0–15.0)
IMMATURE GRANULOCYTES: 0 %
Lymphocytes Relative: 44 %
Lymphs Abs: 2.3 10*3/uL (ref 0.7–4.0)
MCH: 28.3 pg (ref 26.0–34.0)
MCHC: 31.5 g/dL (ref 30.0–36.0)
MCV: 90 fL (ref 78.0–100.0)
Monocytes Absolute: 0.5 10*3/uL (ref 0.1–1.0)
Monocytes Relative: 9 %
NEUTROS ABS: 2.2 10*3/uL (ref 1.7–7.7)
NEUTROS PCT: 43 %
PLATELETS: 162 10*3/uL (ref 150–400)
RBC: 4.2 MIL/uL (ref 3.87–5.11)
RDW: 13.4 % (ref 11.5–15.5)
WBC: 5.2 10*3/uL (ref 4.0–10.5)

## 2018-03-07 NOTE — Care Management Note (Signed)
Case Management Note  Patient Details  Name: Rachael Carpenter MRN: [id number] Date of Birth: [birth year]  Subjective/Objective:   Pt admitted with psychosis. She is from home with her spouse.  PCP: Dr Philip Aspen Insurance: medicare                 Action/Plan: Plan is for [facility] at d/c. CM following for d/c disposition.   Expected Discharge Date:                  Expected Discharge Plan:  Psychiatric Hospital  In-House Referral:  Clinical Social Work  Discharge planning Services     Post Acute Care Choice:    Choice offered to:     DME Arranged:    DME Agency:     HH Arranged:    [place] Agency:     Status of Service:  In process, will continue to follow  If discussed at Long Length of Stay Meetings, dates discussed:    Additional Comments:  Pollie Friar, RN 03/07/2018, 10:50 AM

## 2018-03-07 NOTE — Progress Notes (Signed)
Physical Therapy Treatment Patient Details Name: Rachael Carpenter MRN: [id number] DOB: 1934-11-14 Today's Date: 03/07/2018    History of Present Illness Pt is an 83 y/o female admitted secondary to confusion, suicidal thoughts. Pt with an incidental finding on her MRI: 66mm acute L frontal cortical infarct. PMH including but not limited to HTN, hypothyroidism and dementia.    PT Comments    Rachael Carpenter motivated to work with PT this afternoon. Patient with unsteadiness throughout running into multiple objects in hallway during ambulation. Patient requires consistent cueing and min guard throughout mobility for safety awareness and to scan environment. Will continue to follow to progress safe and independent mobility.     Follow Up Recommendations  Home health PT;Supervision/Assistance - 24 hour     Equipment Recommendations  None recommended by PT    Recommendations for Other Services       Precautions / Restrictions Precautions Precautions: (suicide precautions) Restrictions Weight Bearing Restrictions: No    Mobility  Bed Mobility Overal bed mobility: Modified Independent                Transfers Overall transfer level: Needs assistance Equipment used: None Transfers: Sit to/from Stand Sit to Stand: Supervision         General transfer comment: for safety and immediate standing balance  Ambulation/Gait Ambulation/Gait assistance: Min guard Gait Distance (Feet): (8 min hallway ambulation) Assistive device: None Gait Pattern/deviations: Step-through pattern;Decreased stride length;Drifts right/left Gait velocity: varying throughout   General Gait Details: unsteady gait pattern throughout - runs into multiple objects in hallway and requires consistent cueing for safety/surrounding awareness. quick pace of movement with increased fall risk   Stairs             Wheelchair Mobility    Modified Rankin (Stroke Patients Only) Modified Rankin (Stroke  Patients Only) Pre-Morbid Rankin Score: No symptoms Modified Rankin: No significant disability     Balance Overall balance assessment: Mild deficits observed, not formally tested             Standing balance comment: requires min guard and cueing throughout for safety                            Cognition Arousal/Alertness: Awake/alert Behavior During Therapy: WFL for tasks assessed/performed Overall Cognitive Status: History of cognitive impairments - at baseline                                 General Comments: pt with dementia at baseline; however, able to follow commands and have appropriate conversation      Exercises      General Comments        Pertinent Vitals/Pain Pain Assessment: No/denies pain    Home Living                      Prior Function            PT Goals (current goals can now be found in the care plan section) Acute Rehab PT Goals Patient Stated Goal: "I just want to go home"  PT Goal Formulation: With patient Time For Goal Achievement: 03/19/18 Potential to Achieve Goals: Good Progress towards PT goals: Progressing toward goals    Frequency    Min 3X/week      PT Plan Current plan remains appropriate    Co-evaluation  AM-PAC PT "6 Clicks" Daily Activity  Outcome Measure  Difficulty turning over in bed (including adjusting bedclothes, sheets and blankets)?: None Difficulty moving from lying on back to sitting on the side of the bed? : None Difficulty sitting down on and standing up from a chair with arms (e.g., wheelchair, bedside commode, etc,.)?: A Little Help needed moving to and from a bed to chair (including a wheelchair)?: A Little Help needed walking in hospital room?: A Little Help needed climbing 3-5 steps with a railing? : A Little 6 Click Score: 20    End of Session Equipment Utilized During Treatment: Gait belt Activity Tolerance: Patient tolerated treatment  well Patient left: in chair;with call bell/phone within reach;with nursing/sitter in room Nurse Communication: Mobility status PT Visit Diagnosis: Other abnormalities of gait and mobility (R26.89)     Time: 4944-9675 PT Time Calculation (min) (ACUTE ONLY): 14 min  Charges:  $Gait Training: 8-22 mins                     Lanney Gins, PT, DPT 03/07/18 3:06 PM Pager: [phone]

## 2018-03-07 NOTE — Progress Notes (Addendum)
[facility] PROGRESS NOTE  Rachael Carpenter ZJQ:[id number] DOB: [birth year] DOA: 03/04/2018 PCP: [name], MD  Narrative:  83 year old female--subacute new onset of memory problems hallucinations poor insight which is worsened over the past year-associated with poor insight, auditory hallucinations, forgetting appointments and usual habit routine-some wandering at night Patient of [name] neurology-last seen 8/14 Rx for UTI-to be pansensitive E. coli treated with Bactrim Started on 2000 mg B12- also found to have thyroperoxidase antibodies 192, thyroglobulin antibodies 91 Started as an outpatient 8/14 on high-dose steroids 1000 mg X 3 days as a trial to treat Hashimoto's encephalitis  Other medical problems recurrent cough followed by [name] apparent?  Upper airway syndrome) HTN HLD     A & Plan Multifactorial encephalopathy-MRI not corresponding to clinical syndrome Appreciate input from psych-with hallucinations likely psych induced encephalopathy Seroquel 100 every afternoon Seems to be stabilizing to some degree although labile emotionally  Hashimoto's encephalopathy/thyroiditis-relatively rare and controversial-no steroids at this juncture-would probably use other alternative agents as an outpatient and will need guidacne from Endocrinology--did call to get input from [name] of endocrinology and await callback  E. coli cystitis-appropriately treated with a course of Bactrim in the outpatient setting-no further work-up  Dementia/pseudodementia with hallucinations-as above-reasonable to continue Aricept 10 mg-Seroquel 100 every afternoon-would discontinue Claritin--seems more appropriate  Possible B12 deficiency-treated in the outpatient setting-continue treatment as per neurology suspect may need this long-term   loveneox  DVT prophylaxis: above  Code Status: full   Family Communication:  called both chart numbers and no answer today 8/19 Disposition Plan: ip     [name], MD  [facility] Direct contact: [phone] --Via amion app OR  --[email or web address]; password [email or web address]  7PM-7AM contact night coverage as above 03/07/2018, 12:33 PM  LOS: 2 days   Consultants:  neuro  Procedures:  psych  Antimicrobials:   none  Interval history/Subjective:  Awake alert Becomes upset at discussion of going to psychiatric facility Otherwise pleasant and redirectable No overt issues Was doing sudoku with sitter at the bedside  Objective:  Vitals:  Vitals:   03/07/18 0815 03/07/18 1217  BP: (!) 113/50 (!) 130/50  Pulse: 83 73  Resp: 19 18  Temp: 97.7 F (36.5 C) (!) 97.5 F (36.4 C)  SpO2: 98% 96%    Exam:  Emotionally labile in no distress EOMI NCAT no distress Chest clear no added sound Abdomen soft nontender no rebound no guarding Seems to orient to some degree but not complete neurologically intact   I have personally reviewed the following:   Labs:    Imaging studies:  MRI shows small acute CVA  Medical tests:  no   Test discussed with performing physician:  no  Decision to obtain old records: Y  Review and summation of old records:  Y  Scheduled Meds: . amLODipine  2.5 mg Oral Daily  . aspirin  300 mg Rectal Daily   Or  . aspirin  325 mg Oral Daily  . atorvastatin  40 mg Oral q1800  . calcium-vitamin D  1 tablet Oral Q breakfast  . donepezil  10 mg Oral QHS  . heparin  5,000 Units Subcutaneous Q8H  . levothyroxine  200 mcg Oral Daily  . QUEtiapine  100 mg Oral Q1500  . vitamin B-12  500 mcg Oral Daily   Continuous Infusions:   Principal Problem:   Psychosis, paranoid (HCC) Active Problems:   Essential hypertension   Hallucinations   Encephalopathy   Hypothyroidism   AKI (acute kidney  injury) ([place])   Suicidal ideation   B12 deficiency   Toxic encephalopathy   LOS: 2 days

## 2018-03-07 NOTE — Progress Notes (Addendum)
D/W Dr. Dagmar Hait Endocrine Clinical scenario presented and explained  Recs-  There are other cyclophosphomide/Azathipurine suppressants that can be used--it is unusual for steroids to worsen the symptoms  Suggests consideration for LP--would get this prior to starting Immunosuppressants.   Maybe consider AI encephalitis experts as an OP and consider [facility]-Dr. Sherre Lain, Dr. Arta Bruce   Subsequently d/w Dr. Cheral Marker Neurology who gave additional recs re: studies to obtain on LP  Verneita Griffes, MD [facility] 4:54 PM

## 2018-03-07 NOTE — Progress Notes (Signed)
CSW acknowledging consult request for geri psych placement. CSW contacted the following facilities:  [facility]: Faxed referral, unable to get anyone with admissions on the phone [facility]: Spoke with admissions, they're taking referrals; faxed referral [facility]: No beds available [name]: Left a message for Carlyon Shadow, in admissions; will need to verify fax number prior to faxing referral [facility]: Unable to take referral as patient has a dementia diagnosis  CSW to follow.  Laveda Abbe, [place] Clinical Social Worker [phone]

## 2018-03-08 ENCOUNTER — Inpatient Hospital Stay (HOSPITAL_COMMUNITY): Payer: Medicare Other

## 2018-03-08 LAB — CSF CELL COUNT WITH DIFFERENTIAL
RBC COUNT CSF: 300 /mm3 — AB
Tube #: 3
WBC, CSF: 1 /mm3 (ref 0–5)

## 2018-03-08 LAB — PROTEIN AND GLUCOSE, CSF
Glucose, CSF: 60 mg/dL (ref 40–70)
Total  Protein, CSF: 35 mg/dL (ref 15–45)

## 2018-03-08 MED ORDER — LIDOCAINE HCL (PF) 1 % IJ SOLN
INTRAMUSCULAR | Status: AC
  Filled 2018-03-08: qty 5

## 2018-03-08 NOTE — Clinical Social Work Note (Signed)
Clinical Social Work Assessment  Patient Details  Name: Rachael Carpenter MRN: [id number] Date of Birth: August 29, 1934  Date of referral:  03/08/18               Reason for consult:  Mental Health Concerns                Permission sought to share information with:  Family Supports Permission granted to share information::  Yes, Verbal Permission Granted  Name::     Earlean Polka  Agency::     Relationship::  Husband, Daughter  Sport and exercise psychologist Information:     Housing/Transportation Living arrangements for the past 2 months:  Single Family Home Source of Information:  Medical Team, Spouse Patient Interpreter Needed:  None Criminal Activity/Legal Involvement Pertinent to Current Situation/Hospitalization:  No - Comment as needed Significant Relationships:  Adult Children, Spouse Lives with:  Self, Spouse Do you feel safe going back to the place where you live?  Yes Need for family participation in patient care:  Yes (Comment)  Care giving concerns:  Patient from home with spouse, but will need inpatient psychiatric care at discharge.    Social Worker assessment / plan:  CSW alerted by medical team that patient will need inpatient psychiatric care, initiated bed search. CSW met with patient's husband to discuss recommendations and expectations with patient's husband. CSW answered questions and will continue to follow.  Employment status:  Retired Forensic scientist:  Medicare PT Recommendations:  Home with [place] / Referral to community resources:  Inpatient Psychiatric Care (Comment Required)  Patient/Family's Response to care:  Patient's husband agreeable to inpatient psychiatric care.  Patient/Family's Understanding of and Emotional Response to Diagnosis, Current Treatment, and Prognosis:  Patient's husband discussed how the patient has been really not herself the past couple of weeks, she's started seeing things and responding to people who aren't there. Per patient's  husband's report, she's also been growing increasingly paranoid and has been accusing him of infidelity. Patient's husband says that he's been struggling to cope with all of it, because the doctors have been telling him not to argue with her because she'll get upset but when she's accusing him of things he hasn't done then he has to defend himself. Patient's husband is hopeful that she will get better with psychiatric care.  Emotional Assessment Appearance:  Appears stated age Attitude/Demeanor/Rapport:  Unable to Assess Affect (typically observed):  Unable to Assess Orientation:  Oriented to Self, Oriented to Place, Oriented to  Time Alcohol / Substance use:  Not Applicable Psych involvement (Current and /or in the community):  Yes (Comment)  Discharge Needs  Concerns to be addressed:  Care Coordination Readmission within the last 30 days:  No Current discharge risk:  Psychiatric Illness Barriers to Discharge:  Continued Medical Work up, Psych Bed not available   Geralynn Ochs, LCSW 03/08/2018, 8:34 AM

## 2018-03-08 NOTE — Procedures (Addendum)
Indication: Evaluation of encephalopathy, hypothyroidism.  Risks of the procedure were dicussed with the patient including post-LP headache, bleeding, infection, weakness/numbness of legs(radiculopathy), death.  The patient's proxy agreed and written consent was obtained.   The patient was prepped and draped, and using sterile technique a 20 gauge quinke spinal needle was inserted in the L3/L4 space. Marland Kitchen Approximately 8 cc of CSF were obtained and sent for analysis.  6 attempts were made before successful insertion into the intrathecal sac.  No complications were encountered.  Etta Quill PA-C [facility] [phone]  M-F  (9:00 am- 5:00 PM)  03/08/2018, 3:46 PM

## 2018-03-08 NOTE — Care Management Important Message (Signed)
Important Message  Patient Details  Name: Rachael Carpenter MRN: [id number] Date of Birth: March 05, 1935   Medicare Important Message Given:  Yes    Orbie Pyo 03/08/2018, 4:12 PM

## 2018-03-08 NOTE — Telephone Encounter (Signed)
DAT scan forms signed by Dr. Jaynee Eagles and returned to [facility].

## 2018-03-08 NOTE — Progress Notes (Signed)
[facility] PROGRESS NOTE  Rachael Carpenter EUM:[id number] DOB: 12-Apr-1935 DOA: 03/04/2018 PCP: [name], MD  Narrative:  83 year old female--subacute new onset of memory problems hallucinations poor insight which is worsened over the past year-associated with poor insight, auditory hallucinations, forgetting appointments and usual habit routine-some wandering at night Patient of [name] neurology-last seen 8/14 Rx for UTI-to be pansensitive E. coli treated with Bactrim Started on 2000 mg B12- also found to have thyroperoxidase antibodies 192, thyroglobulin antibodies 91 Started as an outpatient 8/14 on high-dose steroids 1000 mg X 3 days as a trial to treat Hashimoto's encephalitis  Other medical problems recurrent cough followed by [name] apparent?  Upper airway syndrome) HTN HLD     A & Plan Multifactorial encephalopathy-MRI not corresponding to clinical syndrome Appreciate input from psych-with hallucinations likely psych-induced encephalopathy Seroquel 100 every afternoon Seems to be stabilizing to some degree although labile emotionally  Hashimoto's encephalopathy/thyroiditis-relatively rare -discussed with [name] of endocrinology, discussed with neurologist Dr. Tandy Gaw to trial of LP -brief explanation of procedure explained to daughter at the bedside today-further input as per neurology  E. coli cystitis-appropriately treated with a course of Bactrim in the outpatient setting-no further work-up  Dementia/pseudodementia with hallucinations-as above-reasonable to continue Aricept 10 mg-Seroquel 100 every afternoon-would discontinue Claritin--seems more appropriate  Possible B12 deficiency-treated in the outpatient setting-continue treatment as per neurology suspect may need this long-term   loveneox  DVT prophylaxis: above  Code Status: full   Family Communication: Discussed with the daughter at the bedside 8/20 disposition Plan: We are currently  awaiting LP and may need to consider steroids for the same as a retrial-she is not ready for skilled facility and probably will not be for 1 to 2 days   [name], MD  [facility] Direct contact: [phone] --[place]ia [place]  --[email or web address]; password [email or web address]  7PM-7AM contact night coverage as above 03/08/2018, 2:00 PM  LOS: 3 days   Consultants:  neuro  Procedures:  psych  Antimicrobials:   none  Interval history/Subjective:  Emotionally labile redirectable seems more clear Does knowTime year president No chest pain Tolerating diet  Objective:  Vitals:  Vitals:   03/08/18 0811 03/08/18 1200  BP: (!) 110/51 127/63  Pulse: 78 81  Resp: 20 18  Temp: 98 F (36.7 C) 97.6 F (36.4 C)  SpO2: 99% 96%    Exam:  Emotionally labile in no distress EOMI NCAT no distress Chest clear no added sound Abdomen soft nontender no rebound no guarding Seems to orient to some degree but not complete neurologically intact   I have personally reviewed the following:   Labs:    Imaging studies:  MRI shows small acute CVA  Medical tests:  no   Test discussed with performing physician:  no  Decision to obtain old records: Y  Review and summation of old records:  Y  Scheduled Meds: . amLODipine  2.5 mg Oral Daily  . aspirin  300 mg Rectal Daily   Or  . aspirin  325 mg Oral Daily  . atorvastatin  40 mg Oral q1800  . calcium-vitamin D  1 tablet Oral Q breakfast  . donepezil  10 mg Oral QHS  . heparin  5,000 Units Subcutaneous Q8H  . levothyroxine  200 mcg Oral Daily  . QUEtiapine  100 mg Oral Q1500  . vitamin B-12  500 mcg Oral Daily   Continuous Infusions:   Principal Problem:   Psychosis, paranoid (HCC) Active Problems:   Essential hypertension  Hallucinations   Encephalopathy   Hypothyroidism   AKI (acute kidney injury) ([place])   Suicidal ideation   B12 deficiency   Toxic encephalopathy   LOS: 3 days

## 2018-03-09 LAB — RENAL FUNCTION PANEL
Albumin: 3.4 g/dL — ABNORMAL LOW (ref 3.5–5.0)
Anion gap: 6 (ref 5–15)
BUN: 19 mg/dL (ref 8–23)
CHLORIDE: 103 mmol/L (ref 98–111)
CO2: 27 mmol/L (ref 22–32)
Calcium: 8.5 mg/dL — ABNORMAL LOW (ref 8.9–10.3)
Creatinine, Ser: 1 mg/dL (ref 0.44–1.00)
GFR calc Af Amer: 60 mL/min — ABNORMAL LOW (ref >60)
GFR, EST NON AFRICAN AMERICAN: 51 mL/min — AB (ref >60)
Glucose, Bld: 90 mg/dL (ref 70–99)
POTASSIUM: 3.9 mmol/L (ref 3.5–5.1)
Phosphorus: 3.3 mg/dL (ref 2.5–4.6)
Sodium: 136 mmol/L (ref 135–145)

## 2018-03-09 LAB — CSF IGG: IgG, CSF: 1.8 mg/dL (ref 0.0–8.6)

## 2018-03-09 NOTE — Progress Notes (Signed)
CSF initial results not consistent with a fulminant inflammatory process, given normal white cells and normal protein. However, autoimmune encephalopathies often with this type of profile and will need to wait for IgG index and oligoclonal bands to come back to assess whether the CSF profile is consistent with Hashimoto's encephalopathy or not.   I have ordered a serum anti-microsomal antibody and anti-thyroglobulin antibody level to further assess the possibility of Hashimoto's encephalopathy. These can be followed up as an outpatient.   She will need outpatient Neurology follow up in 2-4 weeks.   Neurohospitalist team to sign off. Please call if there are additional questions.   Electronically signed: Dr. Kerney Elbe

## 2018-03-09 NOTE — Progress Notes (Signed)
Physical Therapy Treatment Patient Details Name: Rachael Carpenter MRN: [id number] DOB: February 03, 1935 Today's Date: 03/09/2018    History of Present Illness Pt is an 83 y/o female admitted secondary to confusion, suicidal thoughts. Pt with an incidental finding on her MRI: 30mm acute L frontal cortical infarct. PMH including but not limited to HTN, hypothyroidism and dementia.    PT Comments    Patient with husband and sitter in bed - agreeable to PT session. Continued unsteadiness with gait and mobility with poor attention to task requiring frequent verbal cueing for safety and surrounding awareness. At times becoming very close to objects in hallway increasing her fall risk. Drifts R<>L throughout  With patient unaware.    Follow Up Recommendations  Home health PT;Supervision/Assistance - 24 hour(inpatient psych/[facility])     Equipment Recommendations  None recommended by PT    Recommendations for Other Services       Precautions / Restrictions Precautions Precautions: Other (comment);Fall(suicide precautions) Restrictions Weight Bearing Restrictions: No    Mobility  Bed Mobility Overal bed mobility: Modified Independent                Transfers Overall transfer level: Needs assistance Equipment used: None Transfers: Sit to/from Stand Sit to Stand: Supervision         General transfer comment: slightly impulsive - supervision for safety  Ambulation/Gait Ambulation/Gait assistance: Min guard Gait Distance (Feet): (9 min hallway ambulation) Assistive device: None Gait Pattern/deviations: Step-through pattern;Decreased stride length;Drifts right/left Gait velocity: varying throughout   General Gait Details: continued instability with gait with varying gait speed; verabl cueing for safety/surrounding awareness with limited carryover; drifts R <> L with patient near contact with objects in hallway increasing her fall risk.    Stairs             Wheelchair  Mobility    Modified Rankin (Stroke Patients Only) Modified Rankin (Stroke Patients Only) Pre-Morbid Rankin Score: No symptoms Modified Rankin: No significant disability     Balance Overall balance assessment: Mild deficits observed, not formally tested             Standing balance comment: requires min guard for safety and cueing throughout due to increased fall risk and poor attention to task                            Cognition Arousal/Alertness: Awake/alert Behavior During Therapy: WFL for tasks assessed/performed Overall Cognitive Status: History of cognitive impairments - at baseline                                 General Comments: pt with dementia at baseline; however, able to follow commands and have appropriate conversation      Exercises      General Comments        Pertinent Vitals/Pain Pain Assessment: No/denies pain    Home Living                      Prior Function            PT Goals (current goals can now be found in the care plan section) Acute Rehab PT Goals Patient Stated Goal: "I just want to go home"  PT Goal Formulation: With patient Time For Goal Achievement: 03/19/18 Potential to Achieve Goals: Good Progress towards PT goals: Progressing toward goals    Frequency  Min 3X/week      PT Plan Current plan remains appropriate    Co-evaluation              AM-PAC PT "6 Clicks" Daily Activity  Outcome Measure  Difficulty turning over in bed (including adjusting bedclothes, sheets and blankets)?: None Difficulty moving from lying on back to sitting on the side of the bed? : None Difficulty sitting down on and standing up from a chair with arms (e.g., wheelchair, bedside commode, etc,.)?: A Little Help needed moving to and from a bed to chair (including a wheelchair)?: A Little Help needed walking in hospital room?: A Little Help needed climbing 3-5 steps with a railing? : A Little 6  Click Score: 20    End of Session Equipment Utilized During Treatment: Gait belt Activity Tolerance: Patient tolerated treatment well Patient left: in bed;with call bell/phone within reach;with nursing/sitter in room;with family/visitor present Nurse Communication: Mobility status PT Visit Diagnosis: Other abnormalities of gait and mobility (R26.89)     Time: 6789-3810 PT Time Calculation (min) (ACUTE ONLY): 13 min  Charges:  $Gait Training: 8-22 mins                     Lanney Gins, PT, DPT 03/09/18 12:02 PM Pager: [phone]

## 2018-03-09 NOTE — Progress Notes (Signed)
[facility] PROGRESS NOTE  Rachael Carpenter WCB:[id number] DOB: [birth year] DOA: 03/04/2018 PCP: [name], MD  Narrative:  83 year old female--subacute new onset of memory problems hallucinations poor insight which is worsened over the past year-associated with poor insight, auditory hallucinations, forgetting appointments and usual habit routine-some wandering at night Patient of [name] neurology-last seen 8/14 Rx for UTI-to be pansensitive E. coli treated with Bactrim Started on 2000 mg B12- also found to have thyroperoxidase antibodies 192, thyroglobulin antibodies 91 Started as an outpatient 8/14 on high-dose steroids 1000 mg X 3 days as a trial to treat Hashimoto's encephalitis  Other medical problems recurrent cough followed by [name] apparent?  Upper airway syndrome) HTN HLD     A & Plan Multifactorial encephalopathy-MRI not corresponding to clinical syndrome Appreciate input from psych-with hallucinations likely psych-induced encephalopathy Seroquel 100 every afternoon Seems to be stabilizing to some degree although labile emotionally  Hashimoto's encephalopathy/thyroiditis-relatively rare -discussed with [name] of endocrinology, discussed with neurologist Dr. Tandy Gaw to trial of LP -await IgG index, oligoclonal bands, antimicrosomal antibody and antithyroglobulin antibody--- will can be followed as an outpatient and I will cc primary neurologist regarding the same as patient will likely be going to geriatric psychiatric facility  E. coli cystitis-appropriately treated with a course of Bactrim in the outpatient setting-no further work-up  Dementia/pseudodementia with hallucinations-as above-reasonable to continue Aricept 10 mg-Seroquel 100 every afternoon-would discontinue Claritin--seems more appropriate  Possible B12 deficiency-treated in the outpatient setting-continue treatment as per neurology suspect may need this long-term   loveneox  DVT  prophylaxis: above  Code Status: full   Family Communication: Discussed husband at the bedside in detail on 8/21 and is clear on the need for outpatient follow-up with her primary neurologist  disposition Plan: Await insurance authorization-stabilized for discharge this time   [name], MD  [facility] Direct contact: [phone] --Via amion app [place]  --[email or web address]; password [email or web address]  7PM-7AM contact night coverage as above 03/09/2018, 11:29 AM  LOS: 4 days   Consultants:  neuro  Procedures:  psych  Antimicrobials:   none  Interval history/Subjective:  Lability is less than previously walking in the halls no new issues  Objective:  Vitals:  Vitals:   03/08/18 1941 03/09/18 0422  BP: (!) 127/55 (!) 128/52  Pulse: 77 74  Resp: 18 18  Temp: (!) 97.5 F (36.4 C) 97.8 F (36.6 C)  SpO2: 98% 99%    Exam:  Awake alert in nad EOMI NCAT no distress Chest clear no added sound Abdomen soft nontender no rebound no guarding Seems to orient--it was normal although she was impulsive, power is 5/5   I have personally reviewed the following:   Labs:  Awaiting all of LP results-RBC was 600 WBC 1 total protein 35 CSF cultures pending glucose was 60  See above note for other labs  Imaging studies:  MRI shows small acute CVA  Medical tests:  no   Test discussed with performing physician:  no  Decision to obtain old records: Y  Review and summation of old records:  Y  Scheduled Meds: . amLODipine  2.5 mg Oral Daily  . aspirin  300 mg Rectal Daily   Or  . aspirin  325 mg Oral Daily  . atorvastatin  40 mg Oral q1800  . calcium-vitamin D  1 tablet Oral Q breakfast  . donepezil  10 mg Oral QHS  . levothyroxine  200 mcg Oral Daily  . QUEtiapine  100 mg Oral Q1500  . vitamin B-12  500 mcg Oral Daily   Continuous Infusions:   Principal Problem:   Psychosis, paranoid ([place]) Active Problems:   Essential hypertension   Hallucinations    Encephalopathy   Hypothyroidism   AKI (acute kidney injury) ([place])   Suicidal ideation   B12 deficiency   Toxic encephalopathy   LOS: 4 days

## 2018-03-10 LAB — OLIGOCLONAL BANDS, CSF + SERM

## 2018-03-10 LAB — THYROGLOBULIN ANTIBODY: Thyroglobulin Antibody: 70.3 IU/mL — ABNORMAL HIGH (ref 0.0–0.9)

## 2018-03-10 LAB — THYROID PEROXIDASE ANTIBODY: Thyroperoxidase Ab SerPl-aCnc: 129 IU/mL — ABNORMAL HIGH (ref 0–34)

## 2018-03-10 NOTE — Progress Notes (Signed)
Physical Therapy Treatment Patient Details Name: Rachael Carpenter MRN: [id number] DOB: 03/18/1935 Today's Date: 03/10/2018    History of Present Illness Pt is an 83 y/o female admitted secondary to confusion, suicidal thoughts. Pt with an incidental finding on her MRI: 4mm acute L frontal cortical infarct. PMH including but not limited to HTN, hypothyroidism and dementia.    PT Comments    Rachael Carpenter doing well today - ambulating in hallway, but with poor safety awareness of L sided objects/individuals with patient near hitting multiple staff members and objects without max cueing. Patient laughs off all education on safety awareness, stating "I'm not walking well because of these socks." Will continue to focus on safe and independent functional mobility for reduced fall risk.    Follow Up Recommendations  Home health PT;Supervision/Assistance - 24 hour(inpatient psych/[facility])     Equipment Recommendations  None recommended by PT    Recommendations for Other Services       Precautions / Restrictions Precautions Precautions: (suicide precautions) Restrictions Weight Bearing Restrictions: No    Mobility  Bed Mobility               General bed mobility comments: up in recliner  Transfers Overall transfer level: Needs assistance Equipment used: None Transfers: Sit to/from Stand Sit to Stand: Supervision         General transfer comment: supervision to safety   Ambulation/Gait Ambulation/Gait assistance: Min guard Gait Distance (Feet): (8 min hallway ambulation) Assistive device: None Gait Pattern/deviations: Step-through pattern;Decreased stride length;Drifts right/left Gait velocity: varying throughout   General Gait Details: unaware of objects to L side - runs into nursing staff and objects in her way - requires max verbal cueing for safety and surrounding awareness   Stairs             Wheelchair Mobility    Modified Rankin (Stroke Patients  Only) Modified Rankin (Stroke Patients Only) Pre-Morbid Rankin Score: No symptoms Modified Rankin: No significant disability     Balance Overall balance assessment: Mild deficits observed, not formally tested             Standing balance comment: requires min guard for safety and cueing throughout due to increased fall risk and poor attention to task                            Cognition Arousal/Alertness: Awake/alert Behavior During Therapy: WFL for tasks assessed/performed Overall Cognitive Status: History of cognitive impairments - at baseline                                 General Comments: pt with dementia at baseline; however, able to follow commands and have appropriate conversation      Exercises      General Comments        Pertinent Vitals/Pain Pain Assessment: No/denies pain    Home Living                      Prior Function            PT Goals (current goals can now be found in the care plan section) Acute Rehab PT Goals Patient Stated Goal: "I just want to go home"  PT Goal Formulation: With patient Time For Goal Achievement: 03/19/18 Potential to Achieve Goals: Good Progress towards PT goals: Progressing toward goals    Frequency  Min 3X/week      PT Plan Current plan remains appropriate    Co-evaluation              AM-PAC PT "6 Clicks" Daily Activity  Outcome Measure  Difficulty turning over in bed (including adjusting bedclothes, sheets and blankets)?: None Difficulty moving from lying on back to sitting on the side of the bed? : None Difficulty sitting down on and standing up from a chair with arms (e.g., wheelchair, bedside commode, etc,.)?: A Little Help needed moving to and from a bed to chair (including a wheelchair)?: A Little Help needed walking in hospital room?: A Little Help needed climbing 3-5 steps with a railing? : A Little 6 Click Score: 20    End of Session Equipment  Utilized During Treatment: Gait belt Activity Tolerance: Patient tolerated treatment well Patient left: in chair;with call bell/phone within reach;with nursing/sitter in room Nurse Communication: Mobility status PT Visit Diagnosis: Other abnormalities of gait and mobility (R26.89)     Time: 6440-3474 PT Time Calculation (min) (ACUTE ONLY): 16 min  Charges:  $Gait Training: 8-22 mins                    Lanney Gins, PT, DPT 03/10/18 10:53 AM Pager: [phone]

## 2018-03-10 NOTE — Progress Notes (Signed)
[facility] PROGRESS NOTE  Rachael Carpenter VPX:[id number] DOB: [birth year] DOA: 03/04/2018 PCP: [name], MD  Narrative:  83 year old female--subacute new onset of memory problems hallucinations poor insight which is worsened over the past year-associated with poor insight, auditory hallucinations, forgetting appointments and usual habit routine-some wandering at night Patient of [name] neurology-last seen 8/14 Rx for UTI-to be pansensitive E. coli treated with Bactrim Started on 2000 mg B12- also found to have thyroperoxidase antibodies 192, thyroglobulin antibodies 91 Started as an outpatient 8/14 on high-dose steroids 1000 mg X 3 days as a trial to treat Hashimoto's encephalitis  Other medical problems recurrent cough followed by [name] apparent?  Upper airway syndrome) HTN HLD     A & Plan Multifactorial encephalopathy-MRI not corresponding to clinical syndrome Appreciate input from psych-with hallucinations likely psych-induced encephalopathy Seroquel 100 every afternoon Seems to be stabilizing to some degree although labile emotionally  Hashimoto's encephalopathy/thyroiditis-relatively rare -discussed with [name] of endocrinology, discussed with neurologist Dr. Tandy Gaw to trial of LP -IgG index and oligoclonal bands are negative arguing possibly against Hashimoto's??-I have forwarded as an inbox message to Dr. Cheral Marker regarding if there is any further work-up that needs to be done--please follow up with him in the morning  E. coli cystitis-appropriately treated with a course of Bactrim in the outpatient setting-no further work-up  Dementia/pseudodementia with hallucinations-as above-reasonable to continue Aricept 10 mg-Seroquel 100 every afternoon-would discontinue Claritin--seems more appropriate  Possible B12 deficiency-treated in the outpatient setting-continue treatment as per neurology suspect may need this long-term   loveneox  DVT  prophylaxis: above  Code Status: full   Family Communication: no family today  disposition Plan: Await insurance authorization-stabilized for discharge this time   Manolo Bosket, MD  [facility] Direct contact: [phone] --Via amion app [place]  --[email or web address]; password [email or web address]  7PM-7AM contact night coverage as above 03/10/2018, 5:25 PM  LOS: 5 days   Consultants:  neuro  Procedures:  psych  Antimicrobials:   none  Interval history/Subjective:  Lability is less No new changes  Objective:  Vitals:  Vitals:   03/10/18 1129 03/10/18 1603  BP: (!) 128/50 (!) 118/53  Pulse: 78 79  Resp: 19 18  Temp: 98.7 F (37.1 C) 98.2 F (36.8 C)  SpO2: 97% 97%    Exam:  Exam overall is unchanged  Awake alert in nad EOMI NCAT no distress Chest clear no added sound Abdomen soft nontender no rebound no guarding Seems to orient--it was normal although she was impulsive, power is 5/5   I have personally reviewed the following:   Labs:   IgG index and oligoclonal bands are negative  Imaging studies:  MRI shows small acute CVA  Medical tests:  no   Test discussed with performing physician:  no  Decision to obtain old records: Y  Review and summation of old records:  Y  Scheduled Meds: . amLODipine  2.5 mg Oral Daily  . aspirin  300 mg Rectal Daily   Or  . aspirin  325 mg Oral Daily  . atorvastatin  40 mg Oral q1800  . calcium-vitamin D  1 tablet Oral Q breakfast  . donepezil  10 mg Oral QHS  . levothyroxine  200 mcg Oral Daily  . QUEtiapine  100 mg Oral Q1500  . vitamin B-12  500 mcg Oral Daily   Continuous Infusions:   Principal Problem:   Psychosis, paranoid (HCC) Active Problems:   Essential hypertension   Hallucinations   Encephalopathy   Hypothyroidism  AKI (acute kidney injury) ([place])   Suicidal ideation   B12 deficiency   Toxic encephalopathy   LOS: 5 days

## 2018-03-11 DIAGNOSIS — F22 Delusional disorders: Secondary | ICD-10-CM

## 2018-03-11 LAB — CSF CULTURE W GRAM STAIN
Culture: NO GROWTH
Special Requests: NORMAL

## 2018-03-11 LAB — CSF CULTURE

## 2018-03-11 MED ORDER — CLOPIDOGREL BISULFATE 75 MG PO TABS
75.0000 mg | ORAL_TABLET | Freq: Every day | ORAL | Status: DC
  Administered 2018-03-11 – 2018-03-18 (×8): 75 mg via ORAL
  Filled 2018-03-11 (×8): qty 1

## 2018-03-11 MED ORDER — ATORVASTATIN CALCIUM 80 MG PO TABS
80.0000 mg | ORAL_TABLET | Freq: Every day | ORAL | Status: DC
  Administered 2018-03-12 – 2018-03-17 (×6): 80 mg via ORAL
  Filled 2018-03-11 (×6): qty 1

## 2018-03-11 MED ORDER — ASPIRIN EC 81 MG PO TBEC
81.0000 mg | DELAYED_RELEASE_TABLET | Freq: Every day | ORAL | Status: DC
  Administered 2018-03-12 – 2018-03-18 (×7): 81 mg via ORAL
  Filled 2018-03-11 (×8): qty 1

## 2018-03-11 NOTE — Progress Notes (Signed)
CSW following for discharge. CSW contacted Admissions at [facility]; spoke with Peter Congo. They have one bed available, will review referral. CSW faxed referral, but received no call back.  CSW to follow.  Laveda Abbe, [place] Clinical Social Worker [phone]

## 2018-03-11 NOTE — Progress Notes (Signed)
CSW following for discharge. CSW called and spoke with Caryl Pina in Admissions at [facility] this morning; they were still reviewing referral, and would call CSW back if they could offer a bed for the patient.  CSW never received call back. CSW to follow.  Laveda Abbe, [place] Clinical Social Worker [phone]

## 2018-03-11 NOTE — Progress Notes (Signed)
PROGRESS NOTE    Rachael Carpenter  WIO:[id number] DOB: 21-Jul-1934 DOA: 03/04/2018 PCP: [name], MD   Brief Narrative:  83 year old female--subacute new onset of memory problems hallucinations poor insight which is worsened over the past year-associated with poor insight, auditory hallucinations, forgetting appointments and usual habit routine-some wandering at night Patient of [name] neurology-last seen 8/14 Rx for UTI-to be pansensitive E. coli treated with Bactrim Started on 2000 mg B12- also found to have thyroperoxidase antibodies 192, thyroglobulin antibodies 91 Started as an outpatient 8/14 on high-dose steroids 1000 mg X 3 days as Cianni Manny trial to treat Hashimoto's encephalitis  Other medical problems recurrent cough followed by [name] apparent?  Upper airway syndrome) HTN HLD  Assessment & Plan:   Principal Problem:   Psychosis, paranoid ([place]) Active Problems:   Essential hypertension   Hallucinations   Encephalopathy   Hypothyroidism   AKI (acute kidney injury) ([place])   Suicidal ideation   B12 deficiency   Toxic encephalopathy   Acute Encephalopathy: concerns for hashimoto's encephalopathy.  Seen by neurology who performed LP.  LP with normal WBC, glucose, protein IgG index and oligoclonal bands negative She did have elevated thyroperoxidase abx and thyroglobulin ab (will need to discuss again with neurology) Seroquel 100 every afternoon  Psychosis: pt with hallucinations and paranoia in setting of above Seen by psych who recommended 1:1 sitter Seroquel 100 mg daily Recommended trying to avoid haldol if lewy body dementia dx and trying to avoid steroids as well in pt with psychosis Recommended inpatient psych   Acute Ischemic Stroke: incidental punctate 5 mm acute ischemic nonhemorrhagic L frontal cortical infarct.  Echo without cardiac source of emboli.  Negative MRI head/neck. Tele sinus.   A1c 5.1 LDL 80's Will increase atorvastatin to 80  mg Discussed antiplatelets with neurology who recommended DAPT x 21 days then plavix alone (pt previously taking aspirin 81 mg daily).  E. coli cystitis- s/p bactrim  Dementia/pseudodementia with hallucinations-  continue Aricept 10 mg Seroquel 100 every afternoon  Possible B12 deficiency-treated in the outpatient setting - continue supplementatino  DVT prophylaxis: SCD Code Status: full  Family Communication: none at bedside Disposition Plan: pending placement   Consultants:   Neurology  Psych   Procedures:  Study Conclusions  - Left ventricle: The cavity size was normal. Systolic function was   normal. The estimated ejection fraction was in the range of 60%   to 65%. Wall motion was normal; there were no regional wall   motion abnormalities. Left ventricular diastolic function   parameters were normal. - Aortic valve: Transvalvular velocity was within the normal range.   There was no stenosis. There was no significant regurgitation. - Mitral valve: There was no significant regurgitation. - Atrial septum: No defect or patent foramen ovale was identified. - Tricuspid valve: There was trivial regurgitation. - Pulmonic valve: There was no significant regurgitation.  Impressions:  - No cardiac source of emboli was indentified.  Antimicrobials:  Anti-infectives (From admission, onward)   None     Subjective: Rachael Carpenter&Ox3, took [employer] while to get month. She was seeing ex son in law, that's why she's here.   Objective: Vitals:   03/11/18 0306 03/11/18 0800 03/11/18 1200 03/11/18 1600  BP: 127/61 (!) 133/52 (!) 135/53 (!) 113/58  Pulse: 76 75 84 88  Resp: 17 18 19 17   Temp: 98.2 F (36.8 C) 97.7 F (36.5 C) 97.7 F (36.5 C) 98.2 F (36.8 C)  TempSrc: Oral Oral Oral Oral  SpO2: 97% 98%  96% 96%  Weight:      Height:        Intake/Output Summary (Last 24 hours) at 03/11/2018 1942 Last data filed at 03/11/2018 1215 Gross per 24 hour  Intake 590 ml  Output -  Net  590 ml   Filed Weights   03/05/18 0128  Weight: 89.3 kg    Examination:  General exam: Appears calm and comfortable  Respiratory system: Clear to auscultation. Respiratory effort normal. Cardiovascular system: S1 & S2 heard, RRR. Gastrointestinal system: Abdomen is nondistended, soft and nontender.  Central nervous system: Alert and oriented. No focal neurological deficits. Extremities: Symmetric 5 x 5 power. Skin: No rashes, lesions or ulcers Psychiatry: Judgement and insight appear normal. Mood & affect appropriate.     Data Reviewed: I have personally reviewed following labs and imaging studies  CBC: Recent Labs  Lab 03/05/18 0442 03/07/18 0417  WBC 7.1 5.2  NEUTROABS 3.7 2.2  HGB 12.0 11.9*  HCT 37.8 37.8  MCV 90.6 90.0  PLT 190 993   Basic Metabolic Panel: Recent Labs  Lab 03/05/18 0442 03/07/18 0417 03/09/18 0355  NA 140 139 136  K 4.1 4.2 3.9  CL 108 104 103  CO2 22 28 27   GLUCOSE 86 85 90  BUN 20 21 19   CREATININE 1.09* 1.05* 1.00  CALCIUM 8.5* 8.7* 8.5*  PHOS  --   --  3.3   GFR: Estimated Creatinine Clearance: 46.8 mL/min (by C-G formula based on SCr of 1 mg/dL). Liver Function Tests: Recent Labs  Lab 03/05/18 0442 03/09/18 0355  AST 31  --   ALT 21  --   ALKPHOS 73  --   BILITOT 0.7  --   PROT 6.0*  --   ALBUMIN 3.5 3.4*   No results for input(s): LIPASE, AMYLASE in the last 168 hours. No results for input(s): AMMONIA in the last 168 hours. Coagulation Profile: No results for input(s): INR, PROTIME in the last 168 hours. Cardiac Enzymes: No results for input(s): CKTOTAL, CKMB, CKMBINDEX, TROPONINI in the last 168 hours. BNP (last 3 results) No results for input(s): PROBNP in the last 8760 hours. HbA1C: No results for input(s): HGBA1C in the last 72 hours. CBG: No results for input(s): GLUCAP in the last 168 hours. Lipid Profile: No results for input(s): CHOL, HDL, LDLCALC, TRIG, CHOLHDL, LDLDIRECT in the last 72 hours. Thyroid  Function Tests: No results for input(s): TSH, T4TOTAL, FREET4, T3FREE, THYROIDAB in the last 72 hours. Anemia Panel: No results for input(s): VITAMINB12, FOLATE, FERRITIN, TIBC, IRON, RETICCTPCT in the last 72 hours. Sepsis Labs: No results for input(s): PROCALCITON, LATICACIDVEN in the last 168 hours.  Recent Results (from the past 240 hour(s))  CSF culture with Stat gram stain     Status: None   Collection Time: 03/08/18  4:03 PM  Result Value Ref Range Status   Specimen Description CSF  Final   Special Requests Normal  Final   Gram Stain   Final    WBC PRESENT, PREDOMINANTLY MONONUCLEAR NO ORGANISMS SEEN CYTOSPIN SMEAR    Culture   Final    NO GROWTH 3 DAYS Performed at [facility], [address], [place], [place] [address]    Report Status 03/11/2018 FINAL  Final         Radiology Studies: No results found.      Scheduled Meds: . amLODipine  2.5 mg Oral Daily  . [START ON 03/12/2018] aspirin EC  81 mg Oral Daily  . atorvastatin  40 mg Oral q1800  . calcium-vitamin D  1 tablet Oral Q breakfast  . clopidogrel  75 mg Oral Daily  . donepezil  10 mg Oral QHS  . levothyroxine  200 mcg Oral Daily  . QUEtiapine  100 mg Oral Q1500  . vitamin B-12  500 mcg Oral Daily   Continuous Infusions:   LOS: 6 days    Time spent: over 30 min    Fayrene Helper, MD [facility] Pager [phone]   If 7PM-7AM, please contact night-coverage [email or web address] Password [facility] 03/11/2018, 7:42 PM

## 2018-03-11 NOTE — Consult Note (Signed)
            [facility] CM Primary Care Navigator  03/11/2018  [name] [birth year] [id number]   Went to see patient in the room but she was resting in bed. Safety sitter is at the bedside with her. Per RN report, patient is alert but not able to carry conversation (Dementia/ pseudodementia with hallucinations). Patient has history significant for hypothyroidism, dementia, hypertension, recent diagnosis of B12 deficiency started on injections, and recent UTI for which she has completed a course of Bactrim as outpatient.  Patient presented for evaluation of hallucinations, paranoia, and reported suicidal ideations.  Per chart review, patient was evaluated by psychiatry and determined to be needing Geriatric inpatient psychiatric placement after patient is medically cleared. Patient is currently awaiting for placement facilitated by Inpatient social worker.  Patient's primary care provider is Dr. [name] with [facility].  Noted no further health management needs identified at this point.    For additional questions please contact:  Edwena Felty A. Rami Waddle, BSN, RN-BC [facility] PRIMARY CARE Navigator Cell: [phone]

## 2018-03-12 LAB — CBC
HEMATOCRIT: 36.4 % (ref 36.0–46.0)
HEMOGLOBIN: 11.9 g/dL — AB (ref 12.0–15.0)
MCH: 28.7 pg (ref 26.0–34.0)
MCHC: 32.7 g/dL (ref 30.0–36.0)
MCV: 87.7 fL (ref 78.0–100.0)
Platelets: 148 10*3/uL — ABNORMAL LOW (ref 150–400)
RBC: 4.15 MIL/uL (ref 3.87–5.11)
RDW: 13.4 % (ref 11.5–15.5)
WBC: 6.5 10*3/uL (ref 4.0–10.5)

## 2018-03-12 LAB — BASIC METABOLIC PANEL
ANION GAP: 8 (ref 5–15)
BUN: 18 mg/dL (ref 8–23)
CHLORIDE: 104 mmol/L (ref 98–111)
CO2: 24 mmol/L (ref 22–32)
CREATININE: 0.92 mg/dL (ref 0.44–1.00)
Calcium: 8.3 mg/dL — ABNORMAL LOW (ref 8.9–10.3)
GFR calc non Af Amer: 57 mL/min — ABNORMAL LOW (ref >60)
Glucose, Bld: 98 mg/dL (ref 70–99)
Potassium: 3.8 mmol/L (ref 3.5–5.1)
Sodium: 136 mmol/L (ref 135–145)

## 2018-03-12 LAB — MAGNESIUM: Magnesium: 1.9 mg/dL (ref 1.7–2.4)

## 2018-03-12 NOTE — Progress Notes (Signed)
Subjective: No complaints. Bright affect.   Objective: Current vital signs: BP 126/64 (BP Location: Left Arm)   Pulse 79   Temp 97.7 F (36.5 C) (Oral)   Resp 16   Ht '5\' 3"'  (1.6 m)   Wt 89.3 kg   SpO2 97%   BMI 34.87 kg/m  Vital signs in last 24 hours: Temp:  [97.7 F (36.5 C)-98.3 F (36.8 C)] 97.7 F (36.5 C) (08/24 0803) Pulse Rate:  [79-88] 79 (08/24 0803) Resp:  [16-19] 16 (08/24 0803) BP: (113-135)/(50-64) 126/64 (08/24 0803) SpO2:  [96 %-100 %] 97 % (08/24 0803)  Intake/Output from previous day: 08/23 0701 - 08/24 0700 In: 660 [P.O.:660] Out: -  Intake/Output this shift: Total I/O In: 120 [P.O.:120] Out: -  Nutritional status:  Diet Order            Diet Heart Room service appropriate? Yes; Fluid consistency: Thin  Diet effective now              Neurologic Exam: Ment: Pleasant and cooperative with bright affect. No delays of verbal responses to questions. Follows all commands normally. No receptive or expressive aphasia noted. Mild disorientation (day and month).  CN: EOMI, Visual fields intact with no extinction to DSS. Face symmetric.  Motor: 5/5 x 4 Sensation: Intact to FT x 4 Reflexes: 2+ patellar reflexes bilaterally Cerebellar: No ataxia with FNF bilaterally  Lab Results: Results for orders placed or performed during the hospital encounter of 03/04/18 (from the past 48 hour(s))  CBC     Status: Abnormal   Collection Time: 03/12/18  3:56 AM  Result Value Ref Range   WBC 6.5 4.0 - 10.5 K/uL   RBC 4.15 3.87 - 5.11 MIL/uL   Hemoglobin 11.9 (L) 12.0 - 15.0 g/dL   HCT 36.4 36.0 - 46.0 %   MCV 87.7 78.0 - 100.0 fL   MCH 28.7 26.0 - 34.0 pg   MCHC 32.7 30.0 - 36.0 g/dL   RDW 13.4 11.5 - 15.5 %   Platelets 148 (L) 150 - 400 K/uL    Comment: Performed at [facility], [place] [address] [place], [place] [address]  Basic metabolic panel     Status: Abnormal   Collection Time: 03/12/18  3:56 AM  Result Value Ref Range   Sodium 136 135 - 145  mmol/L   Potassium 3.8 3.5 - 5.1 mmol/L   Chloride 104 98 - 111 mmol/L   CO2 24 22 - 32 mmol/L   Glucose, Bld 98 70 - 99 mg/dL   BUN 18 8 - 23 mg/dL   Creatinine, Ser 0.92 0.44 - 1.00 mg/dL   Calcium 8.3 (L) 8.9 - 10.3 mg/dL   GFR calc non Af Amer 57 (L) >60 mL/min   GFR calc Af Amer >60 >60 mL/min    Comment: (NOTE) The eGFR has been calculated using the CKD EPI equation. This calculation has not been validated in all clinical situations. eGFR's persistently <60 mL/min signify possible Chronic Kidney Disease.    Anion gap 8 5 - 15    Comment: Performed at [facility] [address], [place], [place] [address]  Magnesium     Status: None   Collection Time: 03/12/18  3:56 AM  Result Value Ref Range   Magnesium 1.9 1.7 - 2.4 mg/dL    Comment: Performed at [facility] [address] [place], [place] [address]    Recent Results (from the past 240 hour(s))  CSF culture  with Stat gram stain     Status: None   Collection Time: 03/08/18  4:03 PM  Result Value Ref Range Status   Specimen Description CSF  Final   Special Requests Normal  Final   Gram Stain   Final    WBC PRESENT, PREDOMINANTLY MONONUCLEAR NO ORGANISMS SEEN CYTOSPIN SMEAR    Culture   Final    NO GROWTH 3 DAYS Performed at [facility], [address], [place], [place] [address]    Report Status 03/11/2018 FINAL  Final    Lipid Panel No results for input(s): CHOL, TRIG, HDL, CHOLHDL, VLDL, LDLCALC in the last 72 hours.  Studies/Results: No results found.  Medications:  Scheduled: . amLODipine  2.5 mg Oral Daily  . aspirin EC  81 mg Oral Daily  . atorvastatin  80 mg Oral q1800  . calcium-vitamin D  1 tablet Oral Q breakfast  . clopidogrel  75 mg Oral Daily  . donepezil  10 mg Oral QHS  . levothyroxine  200 mcg Oral Daily  . QUEtiapine  100 mg Oral Q1500  . vitamin B-12  500 mcg Oral Daily   Continuous:    Assessment: 83 year old female with cognitive impairment, presenting  with worsening paranoia and hallucinations. Exam today reveals cognitive impairment without agitation or evidence for paranoia or hallucinations.  1. CSF findings were not consistent with an inflammatory/autoimmune process 2.Although anti-thyroglobulin antibody was positive, this would be expected given her known thyroid disease and does not necessarily provide an indication for treating a possible autoimmune encephalopathy with steroids or immunosuppressive medications.  3. My overall impression is that the patient's initial presentation with worsening paranoia and hallucinations was most likely due to her known dementia rather than an autoimmune encephalopathy  Recommendations: 1. Given relatively mild encephalopathy versus an underlying dementia based upon the exam findings, I do not feel that treatment with steroids or cytotoxic medications is indicated from a risk/benefit standpoint 2. She should be followed clinically as an outpatient both by endocrinology and neurology. If cognition worsens, empiric treatment with pulsed dose steroids for 5 days in conjunction with an antipsychotic agent with a relatively benign side effect profile such as Seroquel could be considered.  3. Discussed with Dr. Florene Glen.   LOS: 7 days   '@Electronically'  signed: Dr. Kerney Elbe 03/12/2018  11:18 AM

## 2018-03-12 NOTE — Progress Notes (Addendum)
PROGRESS NOTE    Rachael Carpenter  KZS:[id number] DOB: [birth year] DOA: 03/04/2018 PCP: [name], MD   Brief Narrative:  83 year old female--subacute new onset of memory problems hallucinations poor insight which is worsened over the past year-associated with poor insight, auditory hallucinations, forgetting appointments and usual habit routine-some wandering at night Patient of [name] neurology-last seen 8/14 Rx for UTI-to be pansensitive E. coli treated with Bactrim Started on 2000 mg B12- also found to have thyroperoxidase antibodies 192, thyroglobulin antibodies 91 Started as an outpatient 8/14 on high-dose steroids 1000 mg X 3 days as a trial to treat Hashimoto's encephalitis  Other medical problems recurrent cough followed by [name] apparent?  Upper airway syndrome) HTN HLD  Assessment & Plan:   Principal Problem:   Psychosis, paranoid ([place]) Active Problems:   Essential hypertension   Hallucinations   Encephalopathy   Hypothyroidism   AKI (acute kidney injury) ([place])   Suicidal ideation   B12 deficiency   Toxic encephalopathy   Acute Encephalopathy: concerns for hashimoto's encephalopathy.  Seen by neurology who performed LP.  LP with normal WBC, glucose, protein IgG index and oligoclonal bands negative She did have elevated thyroperoxidase abx and thyroglobulin ab  Discussed with neurology, appreciate assistance -> recommended outpatient follow up with endocrine and neurology and consider treatment with steroids + antipsychotic if concern for worsening (see neurology note from 8/24) Seroquel 100 every afternoon  Psychosis: pt with hallucinations and paranoia in setting of above Seen by psych who recommended 1:1 sitter Seroquel 100 mg daily Recommended trying to avoid haldol if lewy body dementia dx and trying to avoid steroids as well in pt with psychosis Recommended inpatient psych Consider reconsulting psych  Acute Ischemic Stroke: Pt without  notable deficits on exam.  Incidental punctate 5 mm acute ischemic nonhemorrhagic L frontal cortical infarct.  Echo without cardiac source of emboli.  Negative MRI head/neck. Tele sinus.   A1c 5.1 LDL 80's Will increase atorvastatin to 80 mg Discussed antiplatelets with neurology who recommended DAPT x 21 days then plavix alone (pt previously taking aspirin 81 mg daily).  E. coli cystitis- s/p treatment with bactrim as outpatient  Dementia/pseudodementia with hallucinations-  continue Aricept 10 mg Seroquel 100 every afternoon  Possible B12 deficiency-treated in the outpatient setting - continue supplementation  DVT prophylaxis: SCD Code Status: full  Family Communication: discussed with daughter (she notes pt hallucinating at home, notes that she acts differently when providers present "tells [place] what we want to hear") Disposition Plan: pending placement   Consultants:   Neurology  Psych   Procedures:  Study Conclusions  - Left ventricle: The cavity size was normal. Systolic function was   normal. The estimated ejection fraction was in the range of 60%   to 65%. Wall motion was normal; there were no regional wall   motion abnormalities. Left ventricular diastolic function   parameters were normal. - Aortic valve: Transvalvular velocity was within the normal range.   There was no stenosis. There was no significant regurgitation. - Mitral valve: There was no significant regurgitation. - Atrial septum: No defect or patent foramen ovale was identified. - Tricuspid valve: There was trivial regurgitation. - Pulmonic valve: There was no significant regurgitation.  Impressions:  - No cardiac source of emboli was indentified.  Antimicrobials:  Anti-infectives (From admission, onward)   None     Subjective: A&Ox3. No complaints.  Objective: Vitals:   03/11/18 1600 03/11/18 2334 03/12/18 0351 03/12/18 0803  BP: (!) 113/58 (!) 122/55 Marland Kitchen)  129/50 126/64  Pulse: 88 87  80 79  Resp: 17 18 16 16   Temp: 98.2 F (36.8 C) 98.3 F (36.8 C) 98 F (36.7 C) 97.7 F (36.5 C)  TempSrc: Oral Oral Oral Oral  SpO2: 96% 100% 98% 97%  Weight:      Height:        Intake/Output Summary (Last 24 hours) at 03/12/2018 1614 Last data filed at 03/12/2018 0740 Gross per 24 hour  Intake 240 ml  Output -  Net 240 ml   Filed Weights   03/05/18 0128  Weight: 89.3 kg    Examination:  General: No acute distress. Cardiovascular: Heart sounds show a regular rate, and rhythm. Lungs: Clear to auscultation bilaterally with good air movement.  Abdomen: Soft, nontender, nondistended  Neurological: Alert and oriented 3. Moves all extremities 4. Cranial nerves II through XII grossly intact. Skin: Warm and dry. No rashes or lesions. Extremities: No clubbing or cyanosis. No edema.  Psych: denies auditory or visual hallucinations    Data Reviewed: I have personally reviewed following labs and imaging studies  CBC: Recent Labs  Lab 03/07/18 0417 03/12/18 0356  WBC 5.2 6.5  NEUTROABS 2.2  --   HGB 11.9* 11.9*  HCT 37.8 36.4  MCV 90.0 87.7  PLT 162 774*   Basic Metabolic Panel: Recent Labs  Lab 03/07/18 0417 03/09/18 0355 03/12/18 0356  NA 139 136 136  K 4.2 3.9 3.8  CL 104 103 104  CO2 28 27 24   GLUCOSE 85 90 98  BUN 21 19 18   CREATININE 1.05* 1.00 0.92  CALCIUM 8.7* 8.5* 8.3*  MG  --   --  1.9  PHOS  --  3.3  --    GFR: Estimated Creatinine Clearance: 50.9 mL/min (by C-G formula based on SCr of 0.92 mg/dL). Liver Function Tests: Recent Labs  Lab 03/09/18 0355  ALBUMIN 3.4*   No results for input(s): LIPASE, AMYLASE in the last 168 hours. No results for input(s): AMMONIA in the last 168 hours. Coagulation Profile: No results for input(s): INR, PROTIME in the last 168 hours. Cardiac Enzymes: No results for input(s): CKTOTAL, CKMB, CKMBINDEX, TROPONINI in the last 168 hours. BNP (last 3 results) No results for input(s): PROBNP in the last 8760  hours. HbA1C: No results for input(s): HGBA1C in the last 72 hours. CBG: No results for input(s): GLUCAP in the last 168 hours. Lipid Profile: No results for input(s): CHOL, HDL, LDLCALC, TRIG, CHOLHDL, LDLDIRECT in the last 72 hours. Thyroid Function Tests: No results for input(s): TSH, T4TOTAL, FREET4, T3FREE, THYROIDAB in the last 72 hours. Anemia Panel: No results for input(s): VITAMINB12, FOLATE, FERRITIN, TIBC, IRON, RETICCTPCT in the last 72 hours. Sepsis Labs: No results for input(s): PROCALCITON, LATICACIDVEN in the last 168 hours.  Recent Results (from the past 240 hour(s))  CSF culture with Stat gram stain     Status: None   Collection Time: 03/08/18  4:03 PM  Result Value Ref Range Status   Specimen Description CSF  Final   Special Requests Normal  Final   Gram Stain   Final    WBC PRESENT, PREDOMINANTLY MONONUCLEAR NO ORGANISMS SEEN CYTOSPIN SMEAR    Culture   Final    NO GROWTH 3 DAYS Performed at [facility], [address] [place], [place] [address]    Report Status 03/11/2018 FINAL  Final         Radiology Studies: No results found.      Scheduled Meds: .  amLODipine  2.5 mg Oral Daily  . aspirin EC  81 mg Oral Daily  . atorvastatin  80 mg Oral q1800  . calcium-vitamin D  1 tablet Oral Q breakfast  . clopidogrel  75 mg Oral Daily  . donepezil  10 mg Oral QHS  . levothyroxine  200 mcg Oral Daily  . QUEtiapine  100 mg Oral Q1500  . vitamin B-12  500 mcg Oral Daily   Continuous Infusions:   LOS: 7 days    Time spent: over 30 min    Fayrene Helper, MD [facility] Pager [phone]   If 7PM-7AM, please contact night-coverage [email or web address] Password [facility] 03/12/2018, 4:14 PM

## 2018-03-13 DIAGNOSIS — N179 Acute kidney failure, unspecified: Secondary | ICD-10-CM

## 2018-03-13 DIAGNOSIS — R45851 Suicidal ideations: Secondary | ICD-10-CM

## 2018-03-13 NOTE — Progress Notes (Signed)
PROGRESS NOTE    CELE MOTE  GOT:[id number] DOB: [birth year] DOA: 03/04/2018 PCP: [name], MD   Brief Narrative:  83 year old female--subacute new onset of memory problems hallucinations poor insight which is worsened over the past year-associated with poor insight, auditory hallucinations, forgetting appointments and usual habit routine-some wandering at night Patient of [name] neurology-last seen 8/14 Rx for UTI-to be pansensitive E. coli treated with Bactrim Started on 2000 mg B12- also found to have thyroperoxidase antibodies 192, thyroglobulin antibodies 91 Started as an outpatient 8/14 on high-dose steroids 1000 mg X 3 days as a trial to treat Hashimoto's encephalitis  Other medical problems recurrent cough followed by [name] apparent?  Upper airway syndrome) HTN HLD  Assessment & Plan:   Principal Problem:   Psychosis, paranoid ([place]) Active Problems:   Essential hypertension   Hallucinations   Encephalopathy   Hypothyroidism   AKI (acute kidney injury) ([place])   Suicidal ideation   B12 deficiency   Toxic encephalopathy  Acute encephalopathy: concerns for hashimoto's encephalopathy vs. dementia/pseudodementia. Autoimmune encephalitis felt to be less likely without typical CSF findings per neurology, though she did have elevated thyroperoxidase abx and thyroglobulin Ab (TSH wnl). LP with normal WBC, glucose, protein. IgG index and oligoclonal bands negative. - Will need endocrinology and neurology follow up after discharge. No indication for cytotoxic agents or steroids (concern for worsening mental status) - Seroquel ordered as lew body dementia felt to be unlikely.  Psychosis: pt with hallucinations and paranoia in setting of above - Seen by psych 8/17 who recommended 1:1 sitter, seroquel, and geri-psych placement. Symptoms of active hallucinations seem to have improved, though pt is said to minimize symptoms. Would appreciate psychiatry reevaluation for  disposition recommendations (?continue search for placement).   Acute ischemic Stroke: Pt without notable deficits on exam. Incidental punctate 5 mm acute ischemic nonhemorrhagic L frontal cortical infarct.  Echo without cardiac source of emboli.  Negative MRI head/neck. Tele sinus.  A1c 5.1 - LDL 84, increased atorvastatin - Discussed antiplatelets with neurology who recommended DAPT x 21 days then plavix alone (pt previously taking aspirin 81 mg daily).  E. coli cystitis: s/p treatment with bactrim as outpatient  Dementia/pseudodementia with hallucinations:  continue Aricept 10 mg Seroquel 100 every afternoon  B12 deficiency: treated in the outpatient setting - Continue supplementation  DVT prophylaxis: SCDs Code Status: Full Family Communication: None at bedside this AM Disposition Plan: Continues to be awaiting placement for geri-psych. Last psychiatry evaluation was 8/17. Will need repeat evaluation which I've ordered.   Consultants:   Neurology  Psychiatry  Procedures:  Study Conclusions  - Left ventricle: The cavity size was normal. Systolic function was   normal. The estimated ejection fraction was in the range of 60%   to 65%. Wall motion was normal; there were no regional wall   motion abnormalities. Left ventricular diastolic function   parameters were normal. - Aortic valve: Transvalvular velocity was within the normal range.   There was no stenosis. There was no significant regurgitation. - Mitral valve: There was no significant regurgitation. - Atrial septum: No defect or patent foramen ovale was identified. - Tricuspid valve: There was trivial regurgitation. - Pulmonic valve: There was no significant regurgitation.  Impressions: - No cardiac source of emboli was indentified.  Antimicrobials:  None  Subjective: Has no complaints. Minimizes events leading up to admission. Feels she never did meet criteria for psychiatric evaluation, though per  reports she is adept at minimizing delusions/hallucinations to providers per her  daughter who is not present.  Objective: Vitals:   03/12/18 2106 03/13/18 0415 03/13/18 0922 03/13/18 1217  BP: (!) 121/50 136/60 (!) 128/59 (!) 129/54  Pulse: 76 74 80 83  Resp: 15 18 16 18   Temp: 98.2 F (36.8 C) 97.7 F (36.5 C) (!) 97.4 F (36.3 C) 98.4 F (36.9 C)  TempSrc: Oral  Oral Oral  SpO2: 95% 100% 98% 97%  Weight:      Height:        Intake/Output Summary (Last 24 hours) at 03/13/2018 1651 Last data filed at 03/13/2018 1238 Gross per 24 hour  Intake 960 ml  Output -  Net 960 ml   Filed Weights   03/05/18 0128  Weight: 89.3 kg    Examination: Gen: 83 y.o. female in no distress Pulm: Nonlabored breathing room air. Clear. CV: Regular rate and rhythm. No murmur, rub, or gallop. No JVD, no dependent edema. GI: Abdomen soft, non-tender, non-distended, with normoactive bowel sounds.  Ext: Warm, no deformities Skin: No rashes, lesions or ulcers on visualized skin.  Neuro: Alert and oriented with subtle cognitive impairment. No focal neurological deficits. Psych: Perseverating on stories with persecutorial undertones that seem likely delusional similar to what was previously documented. No inappropriate behavior. Denies SI/HI.    Time spent: 25 min  Patrecia Pour, MD [facility] Pager [phone]   If 7PM-7AM, please contact night-coverage [email or web address] Password [facility] 03/13/2018, 4:51 PM

## 2018-03-14 ENCOUNTER — Telehealth: Payer: Self-pay | Admitting: Neurology

## 2018-03-14 DIAGNOSIS — Z810 Family history of intellectual disabilities: Secondary | ICD-10-CM

## 2018-03-14 MED ORDER — ASPIRIN 81 MG PO CHEW: 81.0000 mg | CHEWABLE_TABLET | Freq: Every day | ORAL | 0 refills | Status: DC

## 2018-03-14 MED ORDER — CLOPIDOGREL BISULFATE 75 MG PO TABS: 75.0000 mg | ORAL_TABLET | Freq: Every day | ORAL | 0 refills | Status: DC

## 2018-03-14 MED ORDER — ATORVASTATIN CALCIUM 80 MG PO TABS: 80.0000 mg | ORAL_TABLET | Freq: Every day | ORAL | 0 refills | Status: DC

## 2018-03-14 NOTE — Plan of Care (Signed)
  Problem: Education: Goal: Knowledge of General Education information will improve Description Including pain rating scale, medication(s)/side effects and non-pharmacologic comfort measures Outcome: Progressing   Problem: Health Behavior/Discharge Planning: Goal: Ability to manage health-related needs will improve Outcome: Progressing   Problem: Clinical Measurements: Goal: Ability to maintain clinical measurements within normal limits will improve Outcome: Progressing Goal: Will remain free from infection Outcome: Progressing Goal: Diagnostic test results will improve Outcome: Progressing Goal: Respiratory complications will improve Outcome: Progressing Goal: Cardiovascular complication will be avoided Outcome: Progressing   Problem: Activity: Goal: Risk for activity intolerance will decrease Outcome: Progressing   Problem: Safety: Goal: Ability to remain free from injury will improve Outcome: Progressing   Problem: Skin Integrity: Goal: Risk for impaired skin integrity will decrease Outcome: Progressing   Problem: Education: Goal: Knowledge of disease or condition will improve Outcome: Progressing Goal: Knowledge of secondary prevention will improve Outcome: Progressing Goal: Knowledge of patient specific risk factors addressed and post discharge goals established will improve Outcome: Progressing Goal: Individualized Educational Video(s) Outcome: Progressing   Problem: Health Behavior/Discharge Planning: Goal: Ability to manage health-related needs will improve Outcome: Progressing   Problem: Self-Care: Goal: Ability to participate in self-care as condition permits will improve Outcome: Progressing Goal: Verbalization of feelings and concerns over difficulty with self-care will improve Outcome: Progressing Goal: Ability to communicate needs accurately will improve Outcome: Progressing   Problem: Nutrition: Goal: Risk of aspiration will decrease Outcome:  Progressing Goal: Dietary intake will improve Outcome: Progressing   Problem: Ischemic Stroke/TIA Tissue Perfusion: Goal: Complications of ischemic stroke/TIA will be minimized Outcome: Progressing

## 2018-03-14 NOTE — Telephone Encounter (Signed)
Patient's husband Rush Landmark (on [place]) calling stating patient has been at [facility] for over a week. He says she was admitted for possible suicide. The hospital is trying to find a psychiatric hospital for the patient as of now and if not a nursing home. Please call to discuss.

## 2018-03-14 NOTE — NC FL2 (Signed)
[place] LEVEL OF CARE SCREENING TOOL     IDENTIFICATION  Patient Name: GWYNNE KEMNITZ Birthdate: 13-May-1935 Sex: female Admission Date (Current Location): 03/04/2018  [facility] and [place] Number:  Herbalist and Address:  [facility], [place] [address] [place], [place] [address]      Provider Number: [id number]  Attending Physician Name and Address:  Patrecia Pour, MD  Relative Name and Phone Number:       Current Level of Care: Hospital Recommended Level of Care: [place] Prior Approval Number:    Date Approved/Denied:   PASRR Number: Manual review  Discharge Plan: SNF    Current Diagnoses: Patient Active Problem List   Diagnosis Date Noted  . Encephalopathy 03/05/2018  . Hypothyroidism 03/05/2018  . AKI (acute kidney injury) ([place]) 03/05/2018  . Suicidal ideation 03/05/2018  . B12 deficiency 03/05/2018  . Toxic encephalopathy 03/05/2018  . Psychosis, paranoid ([place]) 03/05/2018  . Hallucinations 02/28/2018  . HYPERLIPIDEMIA 07/11/2010  . Essential hypertension 07/11/2010  . DYSPNEA 07/11/2010  . COUGH 07/11/2010    Orientation RESPIRATION BLADDER Height & Weight     Self, Time, Situation, Place  Normal Continent Weight: 196 lb 13.9 oz (89.3 kg) Height:  5\' 3"  (160 cm)  BEHAVIORAL SYMPTOMS/MOOD NEUROLOGICAL BOWEL NUTRITION STATUS      Continent Diet(heart healthy)  AMBULATORY STATUS COMMUNICATION OF NEEDS Skin   Limited Assist Verbally Normal                       Personal Care Assistance Level of Assistance  Bathing, Feeding, Dressing Bathing Assistance: Limited assistance Feeding assistance: Independent Dressing Assistance: Limited assistance     Functional Limitations Info  Sight, Hearing, Speech Sight Info: Adequate Hearing Info: Adequate Speech Info: Adequate    SPECIAL CARE FACTORS FREQUENCY  PT (By licensed PT), OT (By licensed OT)     PT Frequency: 5x/wk OT Frequency:  5x/wk            Contractures Contractures Info: Not present    Additional Factors Info  Code Status, Allergies, Psychotropic Code Status Info: Full Allergies Info: Penicillins Psychotropic Info: Aricept 10mg  daily at bed; Seroquel 100mg  daily         Current Medications (03/14/2018):  This is the current hospital active medication list Current Facility-Administered Medications  Medication Dose Route Frequency Provider Last Rate Last Dose  . acetaminophen (TYLENOL) tablet 650 mg  650 mg Oral Q6H PRN Opyd, Ilene Qua, MD       Or  . acetaminophen (TYLENOL) suppository 650 mg  650 mg Rectal Q6H PRN Opyd, Ilene Qua, MD      . amLODipine (NORVASC) tablet 2.5 mg  2.5 mg Oral Daily Opyd, Ilene Qua, MD   2.5 mg at 03/14/18 0917  . aspirin EC tablet 81 mg  81 mg Oral Daily Elodia Florence., MD   81 mg at 03/14/18 0315  . atorvastatin (LIPITOR) tablet 80 mg  80 mg Oral q1800 Elodia Florence., MD   80 mg at 03/13/18 1731  . bisacodyl (DULCOLAX) EC tablet 5 mg  5 mg Oral Daily PRN Opyd, Ilene Qua, MD   5 mg at 03/07/18 2132  . calcium-vitamin D (OSCAL WITH D) 500-200 MG-UNIT per tablet 1 tablet  1 tablet Oral Q breakfast Opyd, Ilene Qua, MD   1 tablet at 03/14/18 0917  . clopidogrel (PLAVIX) tablet 75 mg  75 mg Oral Daily Florene Glen,  A Clint Lipps., MD   75 mg at 03/14/18 [phone]  . donepezil (ARICEPT) tablet 10 mg  10 mg Oral QHS Opyd, Ilene Qua, MD   10 mg at 03/13/18 2145  . levothyroxine (SYNTHROID, LEVOTHROID) tablet 200 mcg  200 mcg Oral Daily Opyd, Ilene Qua, MD   200 mcg at 03/14/18 0917  . ondansetron (ZOFRAN) tablet 4 mg  4 mg Oral Q6H PRN Opyd, Ilene Qua, MD       Or  . ondansetron (ZOFRAN) injection 4 mg  4 mg Intravenous Q6H PRN Opyd, Ilene Qua, MD      . QUEtiapine (SEROQUEL) tablet 100 mg  100 mg Oral Q1500 Opyd, Ilene Qua, MD   100 mg at 03/14/18 1517  . senna-docusate (Senokot-S) tablet 1 tablet  1 tablet Oral QHS PRN Opyd, Ilene Qua, MD      . vitamin B-12  (CYANOCOBALAMIN) tablet 500 mcg  500 mcg Oral Daily Opyd, Ilene Qua, MD   500 mcg at 03/14/18 6067     Discharge Medications: Please see discharge summary for a list of discharge medications.  Relevant Imaging Results:  Relevant Lab Results:   Additional Information SS#: [id number]  Geralynn Ochs, LCSW

## 2018-03-14 NOTE — Discharge Summary (Signed)
Physician Discharge Summary  SHATIQUA HEROUX GUY:[id number] DOB: 10-26-1934 DOA: 03/04/2018  PCP: [name], MD  Admit date: 03/04/2018 Discharge date: 03/14/2018  Admitted From: Home Disposition: Home   Recommendations for Outpatient Follow-up:  1. Follow up with PCP in 1-2 weeks 2. Follow up with psychiatry and neurology closely. Continuing B12, seroquel, and aricept. Started DAPT x21 days, then plavix alone for incidental stroke. 3. Recommend office visit with endocrinology for further discussion of thyroid Ab's.  Home Health: PT, OT, aide Equipment/Devices: None Discharge Condition: Stable CODE STATUS: Full Diet recommendation: Heart healthy  Brief/Interim Summary: Rachael Carpenter is an 83 y.o. female with a history of HTN, HLD, thyroid disease, cognitive impairment with behavioral disturbances who was brought to medical attention by her daughter for visual and auditory hallucinations, advancing cognitive dysfunction, nocturnal wandering. She had recently been started on vitamin B12 and antibiotics for E. coli UTI, found to have positive TPO Ab's and thyroglobulin Ab's and so started on high dose steroids for 3 days to treat possible Hashimoto's encephalitis. It was reported that she had had several weeks of worsening hallucinations associated with depression, emotional lability. Symptoms did not improve after steroids. She continued to have delusions of people, specifically a former son-in-law, invading her house. Her daughter interpreted the patient to have suicidal ideation and so she was brought to the ED. Upon arrival to the ED, patient is found to be afebrile, saturating well on room air, and with vitals otherwise normal.  Chemistry panel is notable for creatinine 1.39, up from 0.14 earlier in the month.  CBC is unremarkable.  Ethanol level and salicylate level are undetectable. Neurology was consulted by the ED physician and recommended medical admission for further inpatient  work-up, including MRI brain, prior to psychiatric consultation. Steroids were not restarted due to concern that they may have worsened symptoms. MRI showed a punctate left frontal cortical infarct felt to be incidental but prompted further stroke work up which was unremarkable. Neurology suspected underlying psychiatric etiology. Psychiatry consulted 8/17 recommended inpatient psychiatric admission after medical clearance. Ultimately LP was performed by neurology. Subsequent CSF findings were not consistent with an inflammatory/autoimmune process so no further treatment was felt to be of benefit. Outpatient neurology and endocrinology follow up are recommended. Due to improvement in symptoms, psychiatry was reconsulted 8/26 and felt the patient was more stable, able to be discharged home. This was discussed with the patient and family.   Discharge Diagnoses:  Principal Problem:   Psychosis, paranoid ([place]) Active Problems:   Essential hypertension   Hallucinations   Encephalopathy   Hypothyroidism   AKI (acute kidney injury) ([place])   Suicidal ideation   B12 deficiency   Toxic encephalopathy  Acute encephalopathy: Concerns for hashimoto's encephalopathy vs. dementia/pseudodementia. Autoimmune encephalitis felt to be less likely without typical CSF findings per neurology, though she did have elevated thyroperoxidase Ab and thyroglobulin Ab (TSH wnl). LP with normal WBC, glucose, protein. IgG index and oligoclonal bands negative. - Will need endocrinology and neurology follow up after discharge. No indication for cytotoxic agents or steroids (concern for worsening mental status) - Seroquel ordered as lewy body dementia felt to be less likely.  Psychosis: pt with hallucinations and paranoia in setting of above. This has significantly improved and patient no longer has any intention of self harm.  - Seen by psych 8/17 who recommended 1:1 sitter, seroquel, and geri-psych placement. Symptoms of active  hallucinations seem to have improved. Psychiatry reevaluation 8/26 was obtained and the patient is now  cleared for discharge to her home environment with outpatient psychiatry follow up.  Acute ischemic stroke: Pt without notable deficits on exam. Incidental punctate 5 mm acute ischemic nonhemorrhagic L frontal cortical infarct.  Echo without cardiac source of emboli.  Negative MRI head/neck. Tele sinus.  A1c 5.1 - LDL 84, increased atorvastatin - Discussed antiplatelets with neurology who recommended DAPT x 21 days then plavix alone (pt previously taking aspirin 81 mg daily).  E. coli cystitis: s/p treatment with bactrim as outpatient  Dementia/pseudodementia with hallucinations:  - Continue Aricept 10 mg - Continue Seroquel 100 every afternoon  B12 deficiency: treated in the outpatient setting - Continue supplementation  Discharge Instructions Discharge Instructions    Diet - low sodium heart healthy   Complete by:  As directed    Discharge instructions   Complete by:  As directed    You were admitted for concerns about mental status changes, delusions, hallucinations, and were initially felt to require inpatient psychiatry admission. Fortunately your symptoms have improved and you are cleared by psychiatry for discharge to home. You will need more assistance at home and some changes in medications as well as close follow up.  - Continue taking medications as you were including vitamin B12, aricept and seroquel. It is very important that you follow up with [name] in the next week or so. - Schedule a follow up appointment with the endocrinologist, [name], as soon as possible. Continue taking synthroid as you were. - Due to the stroke, you should continue taking aspirin and add plavix 75mg  daily. Take both for 3 weeks, then stop aspirin and only take plavix. Your lipitor dose should be increased as well (a new prescription was sent to your pharmacy).  - Home health services to  assist you at home will be arranged prior to discharge. - If your symptoms worsen, seek medical attention right away.   Increase activity slowly   Complete by:  As directed      Allergies as of 03/14/2018      Reactions   Penicillins    REACTION: swelling      Medication List    STOP taking these medications   predniSONE 20 MG tablet Commonly known as:  DELTASONE   sulfamethoxazole-trimethoprim 800-160 MG tablet Commonly known as:  BACTRIM DS,SEPTRA DS     TAKE these medications   amLODipine 2.5 MG tablet Commonly known as:  NORVASC Take 2.5 mg by mouth daily.   aspirin 81 MG chewable tablet Chew 1 tablet (81 mg total) by mouth daily. for the next 21 days, then stop What changed:  additional instructions   atorvastatin 80 MG tablet Commonly known as:  LIPITOR Take 1 tablet (80 mg total) by mouth daily. What changed:    medication strength  how much to take   B-12 PO Take 500 mcg by mouth daily.   clopidogrel 75 MG tablet Commonly known as:  PLAVIX Take 1 tablet (75 mg total) by mouth daily. Start taking on:  03/15/2018   donepezil 10 MG tablet Commonly known as:  ARICEPT Take 1 tablet (10 mg total) by mouth at bedtime.   levothyroxine 50 MCG tablet Commonly known as:  SYNTHROID, LEVOTHROID Take 50 mcg by mouth daily before breakfast. **BRAND NAME ONLY** takes with 254mcg   levothyroxine 200 MCG tablet Commonly known as:  SYNTHROID, LEVOTHROID Take 200 mcg by mouth daily. **BRAND NAME ONLY** takes with 54mcg   QUEtiapine 100 MG tablet Commonly known as:  SEROQUEL Take 100 mg  by mouth. Every afternoon      Follow-up Information    [name], MD. Schedule an appointment as soon as possible for a visit in 1 week(s).   Specialty:  Internal Medicine Contact information: [place] [address] [phone]        Melvenia Beam, MD. Schedule an appointment as soon as possible for a visit in 1 week(s).   Specialty:   Neurology Contact information: [address] [place] [address] [phone]        Delrae Rend, MD. Schedule an appointment as soon as possible for a visit in 1 week(s).   Specialty:  Endocrinology Contact information: [address] [place] [address] [phone]          Allergies  Allergen Reactions  . Penicillins     REACTION: swelling    Consultations:  Neurology  Endocrinology, [name], by phone by a previous hospitalist  Psychiatry  Procedures/Studies: Dg Abdomen 1 View  Result Date: 02/20/2018 CLINICAL DATA:  Swallowed partial. EXAM: ABDOMEN - 1 VIEW COMPARISON:  None. FINDINGS: There is no evidence for gaseous bowel dilation to suggest obstruction. Surgical clips right upper quadrant suggest prior cholecystectomy. No radiopaque foreign body over the abdomen or visualized portion of the pelvis. IMPRESSION: Negative. Electronically Signed   By: Misty Stanley M.D.   On: 02/20/2018 00:52   Mr Jodene Nam Head Wo Contrast  Result Date: 03/06/2018 CLINICAL DATA:  Stroke EXAM: MRA HEAD WITHOUT CONTRAST MRA NECK WITHOUT CONTRAST TECHNIQUE: Angiographic images of the Circle of Willis were obtained using MRA technique without intravenous contrast. Angiographic images of the neck were obtained using MRA technique without intravenous contrast. Carotid stenosis measurements (when applicable) are obtained utilizing NASCET criteria, using the distal internal carotid diameter as the denominator. COMPARISON:  MRI head 03/05/2018. FINDINGS: MRA HEAD FINDINGS Both vertebral arteries are patent to the basilar. PICA widely patent bilaterally. Basilar normal. Left AICA patent. Superior cerebellar and posterior cerebral arteries patent bilaterally without stenosis. Internal carotid artery widely patent bilaterally. Anterior and middle cerebral arteries are widely patent and normal bilaterally Negative for cerebral aneurysm. MRA NECK FINDINGS Antegrade flow in the  carotid and vertebral arteries bilaterally Carotid bifurcation widely patent bilaterally without stenosis Both vertebral arteries are patent without stenosis. IMPRESSION: Negative MRA head Negative MRA neck Electronically Signed   By: Franchot Gallo M.D.   On: 03/06/2018 10:18   Mr Jodene Nam Neck Wo Contrast  Result Date: 03/06/2018 CLINICAL DATA:  Stroke EXAM: MRA HEAD WITHOUT CONTRAST MRA NECK WITHOUT CONTRAST TECHNIQUE: Angiographic images of the Circle of Willis were obtained using MRA technique without intravenous contrast. Angiographic images of the neck were obtained using MRA technique without intravenous contrast. Carotid stenosis measurements (when applicable) are obtained utilizing NASCET criteria, using the distal internal carotid diameter as the denominator. COMPARISON:  MRI head 03/05/2018. FINDINGS: MRA HEAD FINDINGS Both vertebral arteries are patent to the basilar. PICA widely patent bilaterally. Basilar normal. Left AICA patent. Superior cerebellar and posterior cerebral arteries patent bilaterally without stenosis. Internal carotid artery widely patent bilaterally. Anterior and middle cerebral arteries are widely patent and normal bilaterally Negative for cerebral aneurysm. MRA NECK FINDINGS Antegrade flow in the carotid and vertebral arteries bilaterally Carotid bifurcation widely patent bilaterally without stenosis Both vertebral arteries are patent without stenosis. IMPRESSION: Negative MRA head Negative MRA neck Electronically Signed   By: Franchot Gallo M.D.   On: 03/06/2018 10:18   Mr Jeri Cos And Wo Contrast  Result Date: 03/05/2018 CLINICAL DATA:  Initial evaluation for acute encephalopathy. EXAM: MRI HEAD WITHOUT AND WITH CONTRAST TECHNIQUE: Multiplanar, multiecho pulse sequences of the brain and surrounding structures were obtained without and with intravenous contrast. CONTRAST:  70mL MULTIHANCE GADOBENATE DIMEGLUMINE 529 MG/ML IV SOLN COMPARISON:  Prior CT from 11/15/2017. FINDINGS:  Brain: Generalized age-related cerebral volume loss. Patchy and confluent T2/FLAIR hyperintensity within the periventricular and deep white matter both cerebral hemispheres, most consistent with chronic small vessel ischemic disease, moderate to advanced in nature. There is a single punctate 5 mm focus of restricted diffusion involving the cortical gray matter of the anterior left frontal lobe (series 5, image 76). Corresponding signal loss seen on ADC map (series 6, image 30). Finding consistent with a tiny acute ischemic infarct. No associated hemorrhage. No other evidence for acute or subacute ischemia. Gray-white matter differentiation otherwise maintained. No other areas of remote cortical infarction. No acute or chronic intracranial hemorrhage. No mass lesion, midline shift or mass effect. No hydrocephalus. No extra-axial fluid collection. Normal pituitary gland. No abnormal enhancement. Vascular: Major intracranial vascular flow voids maintained Skull and upper cervical spine: Craniocervical junction normal. Upper cervical spine within normal limits. Bone marrow signal intensity normal. No scalp soft tissue abnormality. Sinuses/Orbits: Globes normal soft tissues within normal limits. Paranasal sinuses are clear. Small right mastoid effusion noted, of doubtful significance. Other: None. IMPRESSION: 1. Punctate 5 mm acute ischemic nonhemorrhagic left frontal cortical infarct. 2. Underlying age-related atrophy with moderate to advanced chronic small vessel ischemic disease. Electronically Signed   By: Jeannine Boga M.D.   On: 03/05/2018 06:48   Dg Chest Port 1 View  Result Date: 02/19/2018 CLINICAL DATA:  Feels like something is hung in her esophagus, pain, missing her partial denture plate EXAM: PORTABLE CHEST 1 VIEW COMPARISON:  Portable exam 1959 hours without priors for comparison FINDINGS: Normal heart size, mediastinal contours, and pulmonary vascularity. Bibasilar atelectasis. Lungs otherwise  clear. No infiltrate, pleural effusion or pneumothorax. No acute osseous findings or radiopaque foreign bodies. IMPRESSION: Bibasilar atelectasis. Electronically Signed   By: Lavonia Dana M.D.   On: 02/19/2018 20:22    Subjective: Feels well. No recent events. Per staff and sitter, no hallucinations or abnormal behavior for several days. Wants to go home.   Discharge Exam: Vitals:   03/14/18 0803 03/14/18 1200  BP: (!) 120/54 107/66  Pulse: 78 79  Resp: 18 18  Temp: 97.7 F (36.5 C) 97.6 F (36.4 C)  SpO2: 97% 100%   General: Unkempt in acute distress Cardiovascular: RRR, S1/S2 +, no rubs, no gallops Respiratory: CTA bilaterally, no wheezing, no rhonchi Abdominal: Soft, NT, ND, bowel sounds + Extremities: No edema, no cyanosis Neuro: Alert, oriented, no focal deficits Psych: Behavior appropriate. Denies SI/HI.  Labs: BNP (last 3 results) No results for input(s): BNP in the last 8760 hours. Basic Metabolic Panel: Recent Labs  Lab 03/09/18 0355 03/12/18 0356  NA 136 136  K 3.9 3.8  CL 103 104  CO2 27 24  GLUCOSE 90 98  BUN 19 18  CREATININE 1.00 0.92  CALCIUM 8.5* 8.3*  MG  --  1.9  PHOS 3.3  --    Liver Function Tests: Recent Labs  Lab 03/09/18 0355  ALBUMIN 3.4*   No results for input(s): LIPASE, AMYLASE in the last 168 hours. No results for input(s): AMMONIA in the last 168 hours. CBC: Recent Labs  Lab 03/12/18 0356  WBC 6.5  HGB 11.9*  HCT 36.4  MCV 87.7  PLT  148*   Cardiac Enzymes: No results for input(s): CKTOTAL, CKMB, CKMBINDEX, TROPONINI in the last 168 hours. BNP: Invalid input(s): POCBNP CBG: No results for input(s): GLUCAP in the last 168 hours. D-Dimer No results for input(s): DDIMER in the last 72 hours. Hgb A1c No results for input(s): HGBA1C in the last 72 hours. Lipid Profile No results for input(s): CHOL, HDL, LDLCALC, TRIG, CHOLHDL, LDLDIRECT in the last 72 hours. Thyroid function studies No results for input(s): TSH, T4TOTAL,  T3FREE, THYROIDAB in the last 72 hours.  Invalid input(s): FREET3 Anemia work up No results for input(s): VITAMINB12, FOLATE, FERRITIN, TIBC, IRON, RETICCTPCT in the last 72 hours. Urinalysis    Component Value Date/Time   COLORURINE YELLOW 03/05/2018 1427   APPEARANCEUR HAZY (A) 03/05/2018 1427   APPEARANCEUR Clear 02/24/2018 1505   LABSPEC 1.013 03/05/2018 1427   PHURINE 5.0 03/05/2018 1427   GLUCOSEU NEGATIVE 03/05/2018 1427   HGBUR SMALL (A) 03/05/2018 1427   BILIRUBINUR NEGATIVE 03/05/2018 1427   BILIRUBINUR Negative 02/24/2018 1505   KETONESUR NEGATIVE 03/05/2018 1427   PROTEINUR NEGATIVE 03/05/2018 1427   NITRITE NEGATIVE 03/05/2018 1427   LEUKOCYTESUR MODERATE (A) 03/05/2018 1427   LEUKOCYTESUR 2+ (A) 02/24/2018 1505    Microbiology Recent Results (from the past 240 hour(s))  CSF culture with Stat gram stain     Status: None   Collection Time: 03/08/18  4:03 PM  Result Value Ref Range Status   Specimen Description CSF  Final   Special Requests Normal  Final   Gram Stain   Final    WBC PRESENT, PREDOMINANTLY MONONUCLEAR NO ORGANISMS SEEN CYTOSPIN SMEAR    Culture   Final    NO GROWTH 3 DAYS Performed at [facility], [place] [address], [place], [place] [address]    Report Status 03/11/2018 FINAL  Final    Time coordinating discharge: Approximately 40 minutes  Patrecia Pour, MD  [facility] 03/14/2018, 2:44 PM Pager [phone]

## 2018-03-14 NOTE — Progress Notes (Addendum)
Progress Note  Rachael Carpenter XWR:[id number] DOB: 08-26-1934   Brief/Interim Summary: Rachael Carpenter is an 83 y.o. female with a history of HTN, HLD, thyroid disease, cognitive impairment with behavioral disturbances who was brought to medical attention by her daughter for visual and auditory hallucinations, advancing cognitive dysfunction, nocturnal wandering. She had recently been started on vitamin B12 and antibiotics for E. coli UTI, found to have positive TPO Ab's and thyroglobulin Ab's and so started on high dose steroids for 3 days to treat possible Hashimoto's encephalitis. It was reported that she had had several weeks of worsening hallucinations associated with depression, emotional lability. Symptoms did not improve after steroids. She continued to have delusions of people, specifically a former son-in-law, invading her house. Her daughter interpreted the patient to have suicidal ideation and so she was brought to the ED. Upon arrival to the ED, patient is found to be afebrile, saturating well on room air, and with vitals otherwise normal.  Chemistry panel is notable for creatinine 1.39, up from 0.14 earlier in the month.  CBC is unremarkable.  Ethanol level and salicylate level are undetectable. Neurology was consulted by the ED physician and recommended medical admission for further inpatient work-up, including MRI brain, prior to psychiatric consultation. Steroids were not restarted due to concern that they may have worsened symptoms. MRI showed a punctate left frontal cortical infarct felt to be incidental but prompted further stroke work up which was unremarkable. Neurology suspected underlying psychiatric etiology. Psychiatry consulted 8/17 recommended inpatient psychiatric admission after medical clearance. Ultimately LP was performed by neurology. Subsequent CSF findings were not consistent with an inflammatory/autoimmune process so no further treatment was felt to be of benefit. Outpatient  neurology and endocrinology follow up are recommended. Due to improvement in symptoms, psychiatry was reconsulted 8/26 and felt the patient was more stable, able to be discharged home. This was discussed with the patient and family.   Assessment and Plan:  Principal Problem:   Psychosis, paranoid ([place]) Active Problems:   Essential hypertension   Hallucinations   Encephalopathy   Hypothyroidism   AKI (acute kidney injury) ([place])   Suicidal ideation   B12 deficiency   Toxic encephalopathy  Acute encephalopathy: Concerns for hashimoto's encephalopathy vs. dementia/pseudodementia. Autoimmune encephalitis felt to be less likely without typical CSF findings per neurology, though she did have elevated thyroperoxidase Ab and thyroglobulin Ab (TSH wnl). LP with normal WBC, glucose, protein. IgG index and oligoclonal bands negative. - Will need endocrinology and neurology follow up after discharge. No indication for cytotoxic agents or steroids (concern for worsening mental status) - Seroquel ordered as lewy body dementia felt to be less likely.  Psychosis: pt with hallucinations and paranoia in setting of above. This has significantly improved and patient no longer has any intention of self harm.  - Seen by psych 8/17 who recommended 1:1 sitter, seroquel, and geri-psych placement. Symptoms of active hallucinations seem to have improved. Psychiatry reevaluation 8/26 was obtained and the patient is now cleared for discharge to her home environment with outpatient psychiatry follow up.  Acute ischemic stroke: Pt without notable deficits on exam. Incidental punctate 5 mm acute ischemic nonhemorrhagic L frontal cortical infarct.  Echo without cardiac source of emboli.  Negative MRI head/neck. Tele sinus.  A1c 5.1 - LDL 84, increased atorvastatin - Discussed antiplatelets with neurology who recommended DAPT x 21 days then plavix alone (pt previously taking aspirin 81 mg daily).  E. coli cystitis: s/p  treatment with bactrim as outpatient  Dementia/pseudodementia  with hallucinations:  - Continue Aricept 10 mg - Continue Seroquel 100 every afternoon  B12 deficiency: treated in the outpatient setting - Continue supplementation  Disposition: Patient does not require inpatient psychiatric hospitalization at this time, though per physical therapy and her family, she does require 24 hours supervision and assistance. While she lives with her husband, he is 5 and nearing institutionalization himself and unable to care for the patient. Family is unable to provide sufficient care for the patient. Therefore, disposition to skilled nursing facility (pt continues to have impaired balance and decreased safety awareness per PT) will be sought.   Consultations:  Neurology  Endocrinology, [name], by phone by a previous hospitalist  Psychiatry  Subjective: Feels well. No recent events. Per staff and sitter, no hallucinations or abnormal behavior for several days. Wants to go home. Physical therapy feels she requires 24 hours assistance.   Discharge Exam: Vitals:   03/14/18 0803 03/14/18 1200  BP: (!) 120/54 107/66  Pulse: 78 79  Resp: 18 18  Temp: 97.7 F (36.5 C) 97.6 F (36.4 C)  SpO2: 97% 100%   General: Unkempt in acute distress Cardiovascular: RRR, S1/S2 +, no rubs, no gallops Respiratory: CTA bilaterally, no wheezing, no rhonchi Abdominal: Soft, NT, ND, bowel sounds + Extremities: No edema, no cyanosis Neuro: Alert, oriented, no focal deficits Psych: Behavior appropriate. Denies SI/HI.   Time spent: 25 minutes.  Patrecia Pour, MD  [facility] 03/14/2018, 4:04 PM Pager [phone]

## 2018-03-14 NOTE — Progress Notes (Signed)
Physical Therapy Treatment Patient Details Name: Rachael Carpenter MRN: [id number] DOB: 1935-05-29 Today's Date: 03/14/2018    History of Present Illness Pt is an 83 y/o female admitted secondary to confusion, suicidal thoughts. Pt with an incidental finding on her MRI: 82mm acute L frontal cortical infarct. PMH including but not limited to HTN, hypothyroidism and dementia.    PT Comments    Patient continues to present with impaired balance and safety awareness. Pt requires min guard/min A for safe gait and cues for navigating environment. Continue to progress as tolerated.   Follow Up Recommendations  Home health PT;Supervision/Assistance - 24 hour(inpatient psych/[facility])     Equipment Recommendations  None recommended by PT    Recommendations for Other Services       Precautions / Restrictions Precautions Precautions: (suicide precautions) Restrictions Weight Bearing Restrictions: No    Mobility  Bed Mobility Overal bed mobility: Modified Independent                Transfers Overall transfer level: Needs assistance Equipment used: None Transfers: Sit to/from Stand Sit to Stand: Supervision         General transfer comment: supervision to safety   Ambulation/Gait Ambulation/Gait assistance: Min guard;Min assist Gait Distance (Feet): (hallway ambulation) Assistive device: (assistance at trunk with use of gait belt ) Gait Pattern/deviations: Step-through pattern;Decreased stride length;Drifts right/left Gait velocity: varying throughout   General Gait Details: cues for navigating environment as pt continues to have decreased awareness of objects on L side; pt with unsteady gait and with LOB X2 while performing high level gait balance   Stairs             Wheelchair Mobility    Modified Rankin (Stroke Patients Only) Modified Rankin (Stroke Patients Only) Pre-Morbid Rankin Score: No symptoms Modified Rankin: Slight disability     Balance Overall  balance assessment: Mild deficits observed, not formally tested Sitting-balance support: Feet supported Sitting balance-Leahy Scale: Good     Standing balance support: No upper extremity supported Standing balance-Leahy Scale: Fair Standing balance comment: requires min guard for safety and cueing throughout due to increased fall risk and poor attention to task                 Standardized Balance Assessment Standardized Balance Assessment : Dynamic Gait Index   Dynamic Gait Index Level Surface: Mild Impairment Change in Gait Speed: Mild Impairment Gait with Horizontal Head Turns: Mild Impairment Gait with Vertical Head Turns: Mild Impairment Gait and Pivot Turn: Mild Impairment Step Over Obstacle: Mild Impairment Step Around Obstacles: Mild Impairment Steps: Mild Impairment Total Score: 16      Cognition Arousal/Alertness: Awake/alert Behavior During Therapy: WFL for tasks assessed/performed Overall Cognitive Status: History of cognitive impairments - at baseline                                 General Comments: pt with dementia at baseline      Exercises      General Comments General comments (skin integrity, edema, etc.): pt apologized for being unsteady and states "I just can't walk in these socks" throughout session      Pertinent Vitals/Pain Pain Assessment: No/denies pain    Home Living                      Prior Function            PT Goals (current goals  can now be found in the care plan section) Acute Rehab PT Goals PT Goal Formulation: With patient Time For Goal Achievement: 03/19/18 Potential to Achieve Goals: Good Progress towards PT goals: Progressing toward goals    Frequency    Min 3X/week      PT Plan Current plan remains appropriate    Co-evaluation              AM-PAC PT "6 Clicks" Daily Activity  Outcome Measure  Difficulty turning over in bed (including adjusting bedclothes, sheets and  blankets)?: None Difficulty moving from lying on back to sitting on the side of the bed? : None Difficulty sitting down on and standing up from a chair with arms (e.g., wheelchair, bedside commode, etc,.)?: A Little Help needed moving to and from a bed to chair (including a wheelchair)?: A Little Help needed walking in hospital room?: A Little Help needed climbing 3-5 steps with a railing? : A Little 6 Click Score: 20    End of Session Equipment Utilized During Treatment: Gait belt Activity Tolerance: Patient tolerated treatment well Patient left: in chair;with call bell/phone within reach;with nursing/sitter in room Nurse Communication: Mobility status PT Visit Diagnosis: Other abnormalities of gait and mobility (R26.89)     Time: 0944-1000 PT Time Calculation (min) (ACUTE ONLY): 16 min  Charges:  $Gait Training: 8-22 mins                     Earney Navy, PTA Pager: [phone]     Darliss Cheney 03/14/2018, 1:30 PM

## 2018-03-14 NOTE — Consult Note (Signed)
[facility] Psych Consult Progress Note  03/14/2018 12:56 PM Rachael Carpenter  MRN:  [id number] Subjective:  Rachael Carpenter was last seen by the psychiatry consult service on 8/17 for psychosis and SI. She reported that she could no longer deal with her current circumstances. She has been evaluated by neurology for Hashimoto's encephalopathy versus progressive Lewy Body dementia. She was recommended for inpatient psychiatric hospitalization due to [place]. She was continued on Seroquel 100 mg daily for psychosis. Her hallucinations have appeared improved per primary team progress note today.   On interview, Rachael Carpenter reports feeling better and attributes this to her family believing her in regards to her hallucinations.  She reports that she was previously frustrated that they were not listening to her.  She denies SI, HI or AVH since she was last seen by psychiatry on 8/17.  She reports that Seroquel was started by her neurologist.  She denies any side effects from this medication.  She reports no problems with sleep or appetite.  Her family was at bedside with her verbal consent.  Her daughter reports concerns about her husband's ability to care for her at home.  She reports that they have bedbugs.   Principal Problem: Psychosis, paranoid ([place]) Diagnosis:   Patient Active Problem List   Diagnosis Date Noted  . Encephalopathy [G93.40] 03/05/2018  . Hypothyroidism [E03.9] 03/05/2018  . AKI (acute kidney injury) ([place]) [N17.9] 03/05/2018  . Suicidal ideation [R45.851] 03/05/2018  . B12 deficiency [E53.8] 03/05/2018  . Toxic encephalopathy [G92] 03/05/2018  . Psychosis, paranoid ([place]) [F22] 03/05/2018  . Hallucinations [R44.3] 02/28/2018  . HYPERLIPIDEMIA [E78.5] 07/11/2010  . Essential hypertension [I10] 07/11/2010  . DYSPNEA [R06.02] 07/11/2010  . COUGH [R05] 07/11/2010   Total Time spent with patient: 1 hour  Past Psychiatric History: Denies   Past Medical History:  Past Medical History:  Diagnosis  Date  . Colon polyp    in cecum on July 2010 Colonoscopy  . Dementia   . Hyperlipidemia   . Hypothyroidism   . Intestinal malrotation    on CT scan 2010 in [place]  . Neck pain    L; pt denies as of 02/24/18  . Thyroid disease     Past Surgical History:  Procedure Laterality Date  . 4th finger flexor sheath ganglion Left 02/2006  . CHOLECYSTECTOMY  1990s  . [place]  . RIGHT FOOT MOLE EXCISION Right 2011  . scalp lesion removal  2019  . SHOULDER RECONSTRUCTION Right 07/2001  . SHOULDER SURGERY Right   . TOTAL ABDOMINAL HYSTERECTOMY     83 y.o.   Family History:  Family History  Problem Relation Age of Onset  . COPD Sister   . Cancer Daughter   . Cancer Brother   . Cancer Brother   . Dementia Neg Hx    Family Psychiatric  History: As listed above.  Social History:  Social History   Substance and Sexual Activity  Alcohol Use Never  . Frequency: Never     Social History   Substance and Sexual Activity  Drug Use Never    Social History   Socioeconomic History  . Marital status: Married    Spouse name: Not on file  . Number of children: 3  . 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 Highest education level: High school graduate  Occupational History  . Not on file  Social Needs  . Financial resource strain: Not on file  . Food insecurity:  Worry: Not on file    Inability: Not on file  . Transportation needs:    Medical: Not on file    Non-medical: Not on file  Tobacco Use  . Smoking status: Never Smoker  . Smokeless tobacco: Never Used  Substance and Sexual Activity  . Alcohol use: Never    Frequency: Never  . Drug use: Never  . Sexual activity: Not on file  Lifestyle  . Physical activity:    Days per week: Not on file    Minutes per session: Not on file  . Stress: Not on file  Relationships  . Social connections:    Talks on phone: Not on file    Gets together: Not on file    Attends religious service:  Not on file    Active member of club or organization: Not on file    Attends meetings of clubs or organizations: Not on file    Relationship status: Not on file  Other Topics Concern  . Not on file  Social History Narrative   Lives at home with her husband   Right handed    Sleep: Good  Appetite:  Good  Current Medications: Current Facility-Administered Medications  Medication Dose Route Frequency Provider Last Rate Last Dose  . acetaminophen (TYLENOL) tablet 650 mg  650 mg Oral Q6H PRN Opyd, Ilene Qua, MD       Or  . acetaminophen (TYLENOL) suppository 650 mg  650 mg Rectal Q6H PRN Opyd, Ilene Qua, MD      . amLODipine (NORVASC) tablet 2.5 mg  2.5 mg Oral Daily Opyd, Ilene Qua, MD   2.5 mg at 03/14/18 0917  . aspirin EC tablet 81 mg  81 mg Oral Daily Elodia Florence., MD   81 mg at 03/14/18 9326  . atorvastatin (LIPITOR) tablet 80 mg  80 mg Oral q1800 Elodia Florence., MD   80 mg at 03/13/18 1731  . bisacodyl (DULCOLAX) EC tablet 5 mg  5 mg Oral Daily PRN Opyd, Ilene Qua, MD   5 mg at 03/07/18 2132  . calcium-vitamin D (OSCAL WITH D) 500-200 MG-UNIT per tablet 1 tablet  1 tablet Oral Q breakfast Opyd, Ilene Qua, MD   1 tablet at 03/14/18 0917  . clopidogrel (PLAVIX) tablet 75 mg  75 mg Oral Daily Elodia Florence., MD   75 mg at 03/14/18 [phone]  . donepezil (ARICEPT) tablet 10 mg  10 mg Oral QHS Opyd, Ilene Qua, MD   10 mg at 03/13/18 2145  . levothyroxine (SYNTHROID, LEVOTHROID) tablet 200 mcg  200 mcg Oral Daily Opyd, Ilene Qua, MD   200 mcg at 03/14/18 0917  . ondansetron (ZOFRAN) tablet 4 mg  4 mg Oral Q6H PRN Opyd, Ilene Qua, MD       Or  . ondansetron (ZOFRAN) injection 4 mg  4 mg Intravenous Q6H PRN Opyd, Ilene Qua, MD      . QUEtiapine (SEROQUEL) tablet 100 mg  100 mg Oral Q1500 Opyd, Ilene Qua, MD   100 mg at 03/13/18 1535  . senna-docusate (Senokot-S) tablet 1 tablet  1 tablet Oral QHS PRN Opyd, Ilene Qua, MD      . vitamin B-12 (CYANOCOBALAMIN) tablet 500  mcg  500 mcg Oral Daily Opyd, Ilene Qua, MD   500 mcg at 03/14/18 5809    Lab Results: No results found for this or any previous visit (from the past 48 hour(s)).  Blood Alcohol level:  Lab Results  Component  Value Date   [facility] <10 03/04/2018    Musculoskeletal: Strength & Muscle Tone: decreased due to physical deconditioning.  Gait & Station: UTA since patient is lying in bed. Patient leans: N/A  Psychiatric Specialty Exam: Physical Exam  Nursing note and vitals reviewed. Constitutional: She is oriented to person, place, and time. She appears well-developed and well-nourished.  HENT:  Head: Normocephalic and atraumatic.  Neck: Normal range of motion.  Respiratory: Effort normal.  Musculoskeletal: Normal range of motion.  Neurological: She is alert and oriented to person, place, and time.  Skin: No rash noted.  Psychiatric: She has a normal mood and affect. Her speech is normal and behavior is normal. Judgment and thought content normal. Cognition and memory are normal.    Review of Systems  Constitutional: Negative for chills and fever.  Cardiovascular: Negative for chest pain.  Gastrointestinal: Negative for abdominal pain, constipation, diarrhea, nausea and vomiting.  Psychiatric/Behavioral: Negative for depression, hallucinations and suicidal ideas. The patient does not have insomnia.   All other systems reviewed and are negative.   Blood pressure (!) 120/54, pulse 78, temperature 97.7 F (36.5 C), temperature source Oral, resp. rate 18, height 5\' 3"  (1.6 m), weight 89.3 kg, SpO2 97 %.Body mass index is 34.87 kg/m.  General Appearance: Fairly Groomed, elderly, Caucasian female, wearing a hospital gown and lying in bed. NAD.   Eye Contact:  Good  Speech:  Clear and Coherent and Normal Rate  Volume:  Normal  Mood:  Euthymic  Affect:  Appropriate and Congruent  Thought Process:  Goal Directed, Linear and Descriptions of Associations: Intact  Orientation:  Full (Time,  Place, and Person)  Thought Content:  Logical  Suicidal Thoughts:  No  Homicidal Thoughts:  No  Memory:  Immediate;   Fair Recent;   Fair Remote;   Fair  Judgement:  Fair  Insight:  Fair  Psychomotor Activity:  Normal  Concentration:  Concentration: Good and Attention Span: Good  Recall:  [employer] of Knowledge:  Fair  Language:  Good  Akathisia:  No  Handed:  Right  AIMS (if indicated):   N/A  Assets:  Communication Skills Desire for Improvement Housing Intimacy Social Support  ADL's:  Impaired  Cognition: Impaired with concern for dementia.   Sleep:   Okay   Assessment:  LEAHNA HEWSON is a 83 y.o. female who was admitted with acute encephalopathy with psychosis. She denies SI, HI or AVH at this time. She reports an improvement in her mood. She is future oriented and is able to safety plan. She does not warrant inpatient psychiatric hospitalization at this time. She may need resources for additional assistance with in home care per daughter.   Treatment Plan Summary: -Continue Seroquel 100 mg qhs for psychosis.  -Please have SW provide patient with outpatient mental health resources.  -Patient is psychiatrically cleared. Psychiatry will sign off on patient at this time. Please consult psychiatry again as needed.   Faythe Dingwall, DO 03/14/2018, 12:56 PM

## 2018-03-15 NOTE — Progress Notes (Addendum)
Progress Note  Rachael Carpenter GNF:[id number] DOB: June 19, 1935   Brief/Interim Summary: Rachael Carpenter is an 83 y.o. female with a history of HTN, HLD, thyroid disease, cognitive impairment with behavioral disturbances who was brought to medical attention by her daughter for visual and auditory hallucinations, advancing cognitive dysfunction, nocturnal wandering. She had recently been started on vitamin B12 and antibiotics for E. coli UTI, found to have positive TPO Ab's and thyroglobulin Ab's and so started on high dose steroids for 3 days to treat possible Hashimoto's encephalitis. It was reported that she had had several weeks of worsening hallucinations associated with depression, emotional lability. Symptoms did not improve after steroids. She continued to have delusions of people, specifically a former son-in-law, invading her house. Her daughter interpreted the patient to have suicidal ideation and so she was brought to the ED. Upon arrival to the ED, patient is found to be afebrile, saturating well on room air, and with vitals otherwise normal.  Chemistry panel is notable for creatinine 1.39, up from 0.14 earlier in the month.  CBC is unremarkable.  Ethanol level and salicylate level are undetectable. Neurology was consulted by the ED physician and recommended medical admission for further inpatient work-up, including MRI brain, prior to psychiatric consultation. Steroids were not restarted due to concern that they may have worsened symptoms. MRI showed a punctate left frontal cortical infarct felt to be incidental but prompted further stroke work up which was unremarkable. Neurology suspected underlying psychiatric etiology. Psychiatry consulted 8/17 recommended inpatient psychiatric admission after medical clearance. Ultimately LP was performed by neurology. Subsequent CSF findings were not consistent with an inflammatory/autoimmune process so no further treatment was felt to be of benefit. Outpatient  neurology and endocrinology follow up are recommended. Due to improvement in symptoms, psychiatry was reconsulted 8/26 and felt the patient was more stable, able to be discharged home. This was discussed with the patient and family.   Assessment and Plan:  Principal Problem:   Psychosis, paranoid ([place]) Active Problems:   Essential hypertension   Hallucinations   Encephalopathy   Hypothyroidism   AKI (acute kidney injury) ([place])   Suicidal ideation   B12 deficiency   Toxic encephalopathy  Acute encephalopathy: Concerns for hashimoto's encephalopathy vs. dementia/pseudodementia. Autoimmune encephalitis felt to be less likely without typical CSF findings per neurology, though she did have elevated thyroperoxidase Ab and thyroglobulin Ab (TSH wnl). LP with normal WBC, glucose, protein. IgG index and oligoclonal bands negative. - Will need endocrinology and neurology follow up after discharge. No indication for cytotoxic agents or steroids. - Seroquel ordered as lewy body dementia felt to be less likely.  Psychosis: pt with hallucinations and paranoia in setting of above. This has significantly improved and patient no longer has any intention of self harm.  - Seen by psych 8/17 who recommended 1:1 sitter, seroquel, and geri-psych placement. Symptoms of active hallucinations seem to have improved. Psychiatry reevaluation 8/26 was obtained and the patient is now cleared for discharge with outpatient psychiatry follow up.  Acute ischemic stroke: Pt without notable deficits on exam. Incidental punctate 5 mm acute ischemic nonhemorrhagic L frontal cortical infarct.  Echo without cardiac source of emboli.  Negative MRI head/neck. Tele sinus.  A1c 5.1 - LDL 84, increased atorvastatin - Per neurology: DAPT x 21 days then plavix alone (PTA aspirin 81 mg daily).  E. coli cystitis: s/p treatment with bactrim as outpatient  Dementia/pseudodementia with hallucinations:  - Continue Aricept 10 mg -  Continue Seroquel 100 every afternoon  B12 deficiency: treated in the outpatient setting - Continue supplementation  Disposition: Patient does not require inpatient psychiatric hospitalization at this time, though per physical therapy and her family, she does require 24 hours supervision and assistance. While she lives with her husband, he is 72 and nearing institutionalization himself and unable to care for the patient. Family is unable to provide sufficient care for the patient. Awaiting SNF bed offers.   Consultations:  Neurology  Endocrinology, [name], by phone by a previous hospitalist  Psychiatry  Subjective: No new complaints. Denies and hallucinations. No agitation reported by staff. Amenable to short term rehabilitation placement.  Discharge Exam: Vitals:   03/15/18 0733 03/15/18 0951  BP: 110/60 (!) 137/57  Pulse: 72   Resp: 18   Temp: 97.6 F (36.4 C)   SpO2: 96%   Gen: 83 y.o. female in no distress Pulm: Nonlabored breathing room air. Clear. CV: Regular rate and rhythm. No dependent edema. GI: Abdomen soft, non-tender, non-distended, with normoactive bowel sounds.  Ext: Warm, no deformities Skin: No rashes, lesions or ulcers on visualized skin.  Neuro: Alert, oriented, conversant without focal deficits. Psych: No SI/HI, denies AVH. Behavior is appropriate.    Time spent: 15 minutes.  Patrecia Pour, MD  [facility] 03/15/2018, 11:49 AM Pager [phone]

## 2018-03-15 NOTE — Telephone Encounter (Signed)
Spoke with pt's husband. He stated that the patient is still in the hospital and they are looking for skilled nursing. He said they had cleared her to discharge home but he just "couldn't do it". He said most of the time she can talk normal but then she goes back to the hallucinations. He said she talks to him and her daughter in a different way, not a good way. He knows that the hospital team has been in contact with Dr. Jaynee Eagles. He wants to know what she thinks and wants to make sure she knows. He is aware that Dr. Jaynee Eagles is out of the office this week and he will wait for a call back even if it's next week. He was very [employer] attorney.

## 2018-03-15 NOTE — Progress Notes (Signed)
Physical Therapy Treatment Patient Details Name: Rachael Carpenter MRN: [id number] DOB: Jul 06, 1935 Today's Date: 03/15/2018    History of Present Illness Pt is an 83 y/o female admitted secondary to confusion, suicidal thoughts. Pt with an incidental finding on her MRI: 23mm acute L frontal cortical infarct. PMH including but not limited to HTN, hypothyroidism and dementia.    PT Comments    Patient seen for mobility progression. Pt continues to require min guard/min A for gait due to impaired balance. Pt will need 24 hour supervision/assistance upon d/c given impaired balance and cognition. Continue to progress as tolerated.   Follow Up Recommendations  Home health PT;Supervision/Assistance - 24 hour     Equipment Recommendations  None recommended by PT    Recommendations for Other Services       Precautions / Restrictions Precautions Precautions: (suicide precautions) Restrictions Weight Bearing Restrictions: No    Mobility  Bed Mobility Overal bed mobility: Modified Independent                Transfers Overall transfer level: Needs assistance Equipment used: None Transfers: Sit to/from Stand Sit to Stand: Supervision         General transfer comment: supervision for safety   Ambulation/Gait Ambulation/Gait assistance: Min guard;Min assist Gait Distance (Feet): (hallway ambulation) Assistive device: (assistance at trunk with use of gait belt ) Gait Pattern/deviations: Step-through pattern;Decreased stride length;Drifts right/left;Trunk flexed Gait velocity: varying throughout   General Gait Details: assistance for balance especially with directional changes and when distracted by environment; pt appears to have difficulty maintaining consistent gait speed   Stairs             Wheelchair Mobility    Modified Rankin (Stroke Patients Only) Modified Rankin (Stroke Patients Only) Pre-Morbid Rankin Score: No symptoms Modified Rankin: Slight  disability     Balance Overall balance assessment: Mild deficits observed, not formally tested Sitting-balance support: Feet supported Sitting balance-Leahy Scale: Good     Standing balance support: No upper extremity supported Standing balance-Leahy Scale: Fair Standing balance comment: requires min guard for safety and cueing throughout due to increased fall risk and poor attention to task                            Cognition Arousal/Alertness: Awake/alert Behavior During Therapy: WFL for tasks assessed/performed Overall Cognitive Status: History of cognitive impairments - at baseline Area of Impairment: Memory                     Memory: Decreased short-term memory         General Comments: pt with dementia at baseline      Exercises      General Comments        Pertinent Vitals/Pain Pain Assessment: No/denies pain    Home Living                      Prior Function            PT Goals (current goals can now be found in the care plan section) Acute Rehab PT Goals PT Goal Formulation: With patient Time For Goal Achievement: 03/19/18 Potential to Achieve Goals: Good Progress towards PT goals: Progressing toward goals    Frequency    Min 3X/week      PT Plan Current plan remains appropriate    Co-evaluation  AM-PAC PT "6 Clicks" Daily Activity  Outcome Measure  Difficulty turning over in bed (including adjusting bedclothes, sheets and blankets)?: None Difficulty moving from lying on back to sitting on the side of the bed? : None Difficulty sitting down on and standing up from a chair with arms (e.g., wheelchair, bedside commode, etc,.)?: A Little Help needed moving to and from a bed to chair (including a wheelchair)?: A Little Help needed walking in hospital room?: A Little Help needed climbing 3-5 steps with a railing? : A Little 6 Click Score: 20    End of Session Equipment Utilized During  Treatment: Gait belt Activity Tolerance: Patient tolerated treatment well Patient left: with call bell/phone within reach;in bed;with bed alarm set Nurse Communication: Mobility status PT Visit Diagnosis: Other abnormalities of gait and mobility (R26.89)     Time: 0813-8871 PT Time Calculation (min) (ACUTE ONLY): 22 min  Charges:  $Gait Training: 8-22 mins                     Earney Navy, PTA Pager: [phone]     Darliss Cheney 03/15/2018, 4:42 PM

## 2018-03-16 DIAGNOSIS — E538 Deficiency of other specified B group vitamins: Secondary | ICD-10-CM

## 2018-03-16 DIAGNOSIS — R443 Hallucinations, unspecified: Secondary | ICD-10-CM

## 2018-03-16 DIAGNOSIS — E039 Hypothyroidism, unspecified: Secondary | ICD-10-CM

## 2018-03-16 DIAGNOSIS — F0391 Unspecified dementia with behavioral disturbance: Secondary | ICD-10-CM

## 2018-03-16 DIAGNOSIS — G92 Toxic encephalopathy: Secondary | ICD-10-CM

## 2018-03-16 DIAGNOSIS — I10 Essential (primary) hypertension: Secondary | ICD-10-CM

## 2018-03-16 NOTE — Progress Notes (Signed)
CSW following for discharge plan. CSW checked in with Admissions at [facility]; they needed to evaluate patient in person before officially making bed offer. Admissions came to evaluate patient and are able to take her. Patient still does not have PASRR number.  CSW to follow.  Laveda Abbe, [place] Clinical Social Worker [phone]

## 2018-03-16 NOTE — Progress Notes (Signed)
CSW following for discharge plan. CSW acknowledging recommendation for SNF placement. CSW spoke with patient's daughter, Lattie Haw, by phone to discuss bed offers. Patient's daughter has selected [facility].  CSW to confirm bed availability with [facility] and follow for discharge.  Laveda Abbe, [place] Clinical Social Worker [phone]

## 2018-03-16 NOTE — Progress Notes (Signed)
PROGRESS NOTE    Rachael Carpenter  OZD:[id number] DOB: 11-28-1934 DOA: 03/04/2018 PCP: [name], MD    Brief Narrative:  83 year old female who presented with hallucinations, paranoia and suicidal ideation.  Patient does have significant past medical history for hypothyroidism, dementia, hypertension, B12 deficiency and a recent diagnosis of urinary tract infection.  Had outpatient work-up for hallucinations her thyroglobulin and thyroid peroxidase antibodies were elevated concerning for Hashimoto's encephalopathy.  She received high-dose steroids.  Initial physical examination blood pressure 119/50, heart rate 76, respiratory 16, temperature 98.7, oxygen saturation 99%.  She had moist mucous membranes, her lungs were clear to auscultation bilaterally, heart present rhythmic, soft nontender, no lower extremity edema.  Patient was admitted to the hospital with the working diagnosis of metabolic encephalopathy   Assessment & Plan:   Principal Problem:   Psychosis, paranoid ([place]) Active Problems:   Essential hypertension   Hallucinations   Encephalopathy   Hypothyroidism   AKI (acute kidney injury) ([place])   Suicidal ideation   B12 deficiency   Toxic encephalopathy   1. Metabolic encephalopathy with psychosis. Clinically more stable, but not yet back to baseline, will continue neuro checks per unit protocol and physical therapy evaluation. Pending placement at SNF. Will need follow up as outpatient.   2. Acute ischemic CVA. Left frontal ischemic infarct. Continue physical therapy, blood pressure monitoring and statin therapy. Asa and clopoidgrel for secondary prophylaxis.   2. Dementia. Continue seroquel and aricpet  3. B12 deficiency. Continue supplementation.    4. E coli cystitis present on admission. Patient completeted antibiotic therapy with bactrim.    5. HTN. Continue blood pressure control with amlodipine.   6. Hypothyroid. Continue levothyroxine  DVT prophylaxis:  enoxaparin   Code Status:  full Family Communication:  No family at the bedside  Disposition Plan/ discharge barriers: pending placement at snf    Consultants:     Procedures:     Antimicrobials:       Subjective: Patient is feeling well, continue to have some weakness, working with physical therapy, no nausea or vomiting.   Objective: Vitals:   03/15/18 1940 03/16/18 0052 03/16/18 0343 03/16/18 0747  BP: (!) 147/45 (!) 127/50 (!) 124/42 117/67  Pulse: 91 76 70 75  Resp: 18 18 18 17   Temp: 97.7 F (36.5 C) 98.3 F (36.8 C) 98 F (36.7 C) (!) 97.5 F (36.4 C)  TempSrc: Oral Oral Oral Oral  SpO2: 97% 96% 96% 96%  Weight:      Height:        Intake/Output Summary (Last 24 hours) at 03/16/2018 1041 Last data filed at 03/16/2018 2595 Gross per 24 hour  Intake 600 ml  Output -  Net 600 ml   Filed Weights   03/05/18 0128  Weight: 89.3 kg    Examination:   General: Not in pain or dyspnea, deconditioned  Neurology: Awake and alert, non focal  E ENT: mild pallor, no icterus, oral mucosa moist Cardiovascular: No JVD. S1-S2 present, rhythmic, no gallops, rubs, or murmurs. No lower extremity edema. Pulmonary: vesicular breath sounds bilaterally, adequate air movement, no wheezing, rhonchi or rales. Gastrointestinal. Abdomen with no organomegaly, non tender, no rebound or guarding Skin. No rashes Musculoskeletal: no joint deformities     Data Reviewed: I have personally reviewed following labs and imaging studies  CBC: Recent Labs  Lab 03/12/18 0356  WBC 6.5  HGB 11.9*  HCT 36.4  MCV 87.7  PLT 638*   Basic Metabolic Panel: Recent Labs  Lab  03/12/18 0356  NA 136  K 3.8  CL 104  CO2 24  GLUCOSE 98  BUN 18  CREATININE 0.92  CALCIUM 8.3*  MG 1.9   GFR: Estimated Creatinine Clearance: 50.9 mL/min (by C-G formula based on SCr of 0.92 mg/dL). Liver Function Tests: No results for input(s): AST, ALT, ALKPHOS, BILITOT, PROT, ALBUMIN in the last 168  hours. No results for input(s): LIPASE, AMYLASE in the last 168 hours. No results for input(s): AMMONIA in the last 168 hours. Coagulation Profile: No results for input(s): INR, PROTIME in the last 168 hours. Cardiac Enzymes: No results for input(s): CKTOTAL, CKMB, CKMBINDEX, TROPONINI in the last 168 hours. BNP (last 3 results) No results for input(s): PROBNP in the last 8760 hours. HbA1C: No results for input(s): HGBA1C in the last 72 hours. CBG: No results for input(s): GLUCAP in the last 168 hours. Lipid Profile: No results for input(s): CHOL, HDL, LDLCALC, TRIG, CHOLHDL, LDLDIRECT in the last 72 hours. Thyroid Function Tests: No results for input(s): TSH, T4TOTAL, FREET4, T3FREE, THYROIDAB in the last 72 hours. Anemia Panel: No results for input(s): VITAMINB12, FOLATE, FERRITIN, TIBC, IRON, RETICCTPCT in the last 72 hours.    Radiology Studies: I have reviewed all of the imaging during this hospital visit personally     Scheduled Meds: . amLODipine  2.5 mg Oral Daily  . aspirin EC  81 mg Oral Daily  . atorvastatin  80 mg Oral q1800  . calcium-vitamin D  1 tablet Oral Q breakfast  . clopidogrel  75 mg Oral Daily  . donepezil  10 mg Oral QHS  . levothyroxine  200 mcg Oral Daily  . QUEtiapine  100 mg Oral Q1500  . vitamin B-12  500 mcg Oral Daily   Continuous Infusions:   LOS: 11 days        Leitha Hyppolite Gerome Apley, MD [facility] Pager [phone]

## 2018-03-17 DIAGNOSIS — G20 Parkinson's disease: Secondary | ICD-10-CM

## 2018-03-17 NOTE — Progress Notes (Signed)
CSW following for discharge plan. Patient still without PASRR, it has gone to Level 2 review. Ellsworth Lennox from [facility] will be coming over at some point this evening to evaluate the patient for PASRR. Patient will need PASRR number prior to transfer to SNF.  CSW to follow. Patient still has a bed at [facility] when Rosalie Gums is received.  Laveda Abbe, [place] Clinical Social Worker [phone]

## 2018-03-17 NOTE — Progress Notes (Signed)
Physical Therapy Treatment Patient Details Name: Rachael Carpenter MRN: [id number] DOB: 09-28-1934 Today's Date: 03/17/2018    History of Present Illness Pt is an 83 y/o female admitted secondary to confusion, suicidal thoughts. Pt with an incidental finding on her MRI: 62mm acute L frontal cortical infarct. PMH including but not limited to HTN, hypothyroidism and dementia.    PT Comments    Patient seen for mobility progression. Pt agreeable to ambulate in room only due to being upset/crying. Pt reports she "feels like" her brother died and that he was brought to the hospital. No family present at the time. Pt requires min guard/min A for gait training and high level balance activities. Continue to progress as tolerated.    Follow Up Recommendations  Home health PT;Supervision/Assistance - 24 hour     Equipment Recommendations  None recommended by PT    Recommendations for Other Services       Precautions / Restrictions Restrictions Weight Bearing Restrictions: No    Mobility  Bed Mobility Overal bed mobility: Modified Independent                Transfers Overall transfer level: Needs assistance Equipment used: None Transfers: Sit to/from Stand Sit to Stand: Supervision         General transfer comment: supervision for safety   Ambulation/Gait Ambulation/Gait assistance: Min guard Gait Distance (Feet): (~32ft total in room; pt declined ambulating in hallway) Assistive device: (assistance at trunk with use of gait belt ) Gait Pattern/deviations: Step-through pattern;Decreased stride length;Drifts right/left;Trunk flexed Gait velocity: varying throughout   General Gait Details: pt continues to demonstrate unsteady gait; pt holding onto furniture in room when turning; no LOB   Stairs             Wheelchair Mobility    Modified Rankin (Stroke Patients Only) Modified Rankin (Stroke Patients Only) Pre-Morbid Rankin Score: No symptoms Modified Rankin:  Slight disability     Balance Overall balance assessment: Mild deficits observed, not formally tested Sitting-balance support: Feet supported Sitting balance-Leahy Scale: Good     Standing balance support: No upper extremity supported;Single extremity supported;During functional activity Standing balance-Leahy Scale: Fair               High level balance activites: Side stepping;Backward walking High Level Balance Comments: pt requires min guard/min A for balance activities            Cognition Arousal/Alertness: Awake/alert Behavior During Therapy: WFL for tasks assessed/performed Overall Cognitive Status: History of cognitive impairments - at baseline Area of Impairment: Memory                     Memory: Decreased short-term memory         General Comments: pt with dementia at baseline; pt liable and reports "feeling like" her brother died       Exercises General Exercises - Lower Extremity Hip Flexion/Marching: 20 reps;Standing Mini-Sqauts: 15 reps    General Comments        Pertinent Vitals/Pain Pain Assessment: No/denies pain    Home Living                      Prior Function            PT Goals (current goals can now be found in the care plan section) Acute Rehab PT Goals PT Goal Formulation: With patient Time For Goal Achievement: 03/19/18 Potential to Achieve Goals: Good Progress towards PT goals: Progressing toward  goals    Frequency    Min 3X/week      PT Plan Current plan remains appropriate    Co-evaluation              AM-PAC PT "6 Clicks" Daily Activity  Outcome Measure  Difficulty turning over in bed (including adjusting bedclothes, sheets and blankets)?: None Difficulty moving from lying on back to sitting on the side of the bed? : None Difficulty sitting down on and standing up from a chair with arms (e.g., wheelchair, bedside commode, etc,.)?: A Little Help needed moving to and from a bed to  chair (including a wheelchair)?: A Little Help needed walking in hospital room?: A Little Help needed climbing 3-5 steps with a railing? : A Little 6 Click Score: 20    End of Session Equipment Utilized During Treatment: Gait belt Activity Tolerance: Patient tolerated treatment well Patient left: in chair;with call bell/phone within reach Nurse Communication: Mobility status PT Visit Diagnosis: Other abnormalities of gait and mobility (R26.89)     Time: 8592-9244 PT Time Calculation (min) (ACUTE ONLY): 23 min  Charges:  $Gait Training: 8-22 mins $Therapeutic Activity: 8-22 mins                     Earney Navy, PTA Pager: [phone]     Darliss Cheney 03/17/2018, 9:19 AM

## 2018-03-17 NOTE — Discharge Summary (Addendum)
Physician Discharge Summary  Rachael Carpenter HUT:[id number] DOB: 03/05/1935 DOA: 03/04/2018  PCP: [name], MD  Admit date: 03/04/2018 Discharge date: 03/17/2018  Admitted From: Home  Disposition:  SNF  Recommendations for Outpatient Follow-up and new medication changes:  1. Follow up with Dr. Philip Aspen in one week,  2. Follow up psychiatry in 7 days.    Home Health: Na  Equipment/Devices: Na    Discharge Condition: stable  CODE STATUS: full  Diet recommendation: heart healthy   Brief/Interim Summary: 83 year old female who presented with hallucinations, paranoia and suicidal ideation.  Patient does have significant past medical history for hypothyroidism, dementia, hypertension, B12 deficiency and a recent diagnosis of urinary tract infection. She had outpatient work-up for hallucinations and her thyroglobulin and thyroid peroxidase antibodies were elevated concerning for Hashimoto's encephalopathy.  She received high-dose steroids.  Initial physical examination blood pressure 119/50, heart rate 76, respiratory rate 16, temperature 98.7, oxygen saturation 99%.  She had moist mucous membranes, her lungs were clear to auscultation bilaterally, heart S1-S2 present rhythmic, abdomen soft nontender, no lower extremity edema.  Sodium 138, potassium 4.4, bicarb 26, chloride 105, glucose 104, BUN 29, creatinine 1.39, AST 42, ALT 22, white cell count 9.1, hemoglobin 12.5, hematocrit 39.7, platelets 238, urinalysis with 11-20 white cells.  Drug screen was negative.  Patient was admitted to the hospital with the working diagnosis of metabolic encephalopathy  1.  Acute metabolic encephalopathy with psychosis.  Patient was admitted to the medical ward, she was placed on supportive medical therapy, further work-up with lumbar puncture, showed 1 white blood cell, protein 35, glucose  60.  Culture no growth.  Brain MRI showed punctuate left frontal cortical infarct.  She was seen by psychiatry with  initial recommendations for inpatient psychiatric admission, reevaluation August 26 she was deemed safe to be discharged home.  Continue Seroquel 100 mg at night. Will need follow up with psychiatry as outpatient.   2.  Acute ischemic stroke, 5 mm acute ischemic left frontal cortical infarct.  Further evaluation with echocardiography and brain MRA showed no significant sources of CVA.  It was recommended to take dual antiplatelet therapy  for 21 days with asa and clopidogrel then take clopidogrel alone.  3.  Dementia.  Continue Seroquel and Aricept.  Patient will need skilled nursing facility at discharge.  4.  Hypertension.  Continue blood pressure control with amlodipine.  5.  Hypothyroidism.  TSH within normal range, continue levothyroxine.  6.  B12 deficiency.  Continue supplementation.  7.  E. coli cystitis, present on admission, resolved.  Discharge Diagnoses:  Principal Problem:   Psychosis, paranoid ([place]) Active Problems:   Essential hypertension   Hallucinations   Encephalopathy   Hypothyroidism   AKI (acute kidney injury) ([place])   Suicidal ideation   B12 deficiency   Toxic encephalopathy    Discharge Instructions  Discharge Instructions    Diet - low sodium heart healthy   Complete by:  As directed    Discharge instructions   Complete by:  As directed    You were admitted for concerns about mental status changes, delusions, hallucinations, and were initially felt to require inpatient psychiatry admission. Fortunately your symptoms have improved and you are cleared by psychiatry for discharge to home. You will need more assistance at home and some changes in medications as well as close follow up.  - Continue taking medications as you were including vitamin B12, aricept and seroquel. It is very important that you follow up with [name] in  the next week or so. - Schedule a follow up appointment with the endocrinologist, [name], as soon as possible. Continue taking  synthroid as you were. - Due to the stroke, you should continue taking aspirin and add plavix 75mg  daily. Take both for 3 weeks, then stop aspirin and only take plavix. Your lipitor dose should be increased as well (a new prescription was sent to your pharmacy).  - Home health services to assist you at home will be arranged prior to discharge. - If your symptoms worsen, seek medical attention right away.   Increase activity slowly   Complete by:  As directed      Allergies as of 03/17/2018      Reactions   Penicillins    REACTION: swelling      Medication List    STOP taking these medications   predniSONE 20 MG tablet Commonly known as:  DELTASONE   sulfamethoxazole-trimethoprim 800-160 MG tablet Commonly known as:  BACTRIM DS,SEPTRA DS     TAKE these medications   amLODipine 2.5 MG tablet Commonly known as:  NORVASC Take 2.5 mg by mouth daily.   aspirin 81 MG chewable tablet Chew 1 tablet (81 mg total) by mouth daily. for the next 21 days, then stop What changed:  additional instructions   atorvastatin 80 MG tablet Commonly known as:  LIPITOR Take 1 tablet (80 mg total) by mouth daily. What changed:    medication strength  how much to take   B-12 PO Take 500 mcg by mouth daily.   clopidogrel 75 MG tablet Commonly known as:  PLAVIX Take 1 tablet (75 mg total) by mouth daily.   donepezil 10 MG tablet Commonly known as:  ARICEPT Take 1 tablet (10 mg total) by mouth at bedtime.   levothyroxine 50 MCG tablet Commonly known as:  SYNTHROID, LEVOTHROID Take 50 mcg by mouth daily before breakfast. **BRAND NAME ONLY** takes with 237mcg   levothyroxine 200 MCG tablet Commonly known as:  SYNTHROID, LEVOTHROID Take 200 mcg by mouth daily. **BRAND NAME ONLY** takes with 68mcg   QUEtiapine 100 MG tablet Commonly known as:  SEROQUEL Take 100 mg by mouth. Every afternoon       Contact information for follow-up providers    [name], MD. Schedule an  appointment as soon as possible for a visit in 1 week(s).   Specialty:  Internal Medicine Contact information: [place] [address] [phone]        Melvenia Beam, MD. Schedule an appointment as soon as possible for a visit in 1 week(s).   Specialty:  Neurology Contact information: [address] [place] [address] [phone]        Delrae Rend, MD. Schedule an appointment as soon as possible for a visit in 1 week(s).   Specialty:  Endocrinology Contact information: [address]  [place] [address] [phone]            Contact information for after-discharge care    Destination    [facility] .   Service:  Skilled Nursing Contact information: [place] [phone]                 Allergies  Allergen Reactions  . Penicillins     REACTION: swelling    Consultations:  Neurology   Psychiatry    Procedures/Studies: Dg Abdomen 1 View  Result Date: 02/20/2018 CLINICAL DATA:  Swallowed partial. EXAM: ABDOMEN - 1 VIEW  COMPARISON:  None. FINDINGS: There is no evidence for gaseous bowel dilation to suggest obstruction. Surgical clips right upper quadrant suggest prior cholecystectomy. No radiopaque foreign body over the abdomen or visualized portion of the pelvis. IMPRESSION: Negative. Electronically Signed   By: Misty Stanley M.D.   On: 02/20/2018 00:52   Mr Jodene Nam Head Wo Contrast  Result Date: 03/06/2018 CLINICAL DATA:  Stroke EXAM: MRA HEAD WITHOUT CONTRAST MRA NECK WITHOUT CONTRAST TECHNIQUE: Angiographic images of the Circle of Willis were obtained using MRA technique without intravenous contrast. Angiographic images of the neck were obtained using MRA technique without intravenous contrast. Carotid stenosis measurements (when applicable) are obtained utilizing NASCET criteria, using the distal internal carotid diameter as the denominator. COMPARISON:   MRI head 03/05/2018. FINDINGS: MRA HEAD FINDINGS Both vertebral arteries are patent to the basilar. PICA widely patent bilaterally. Basilar normal. Left AICA patent. Superior cerebellar and posterior cerebral arteries patent bilaterally without stenosis. Internal carotid artery widely patent bilaterally. Anterior and middle cerebral arteries are widely patent and normal bilaterally Negative for cerebral aneurysm. MRA NECK FINDINGS Antegrade flow in the carotid and vertebral arteries bilaterally Carotid bifurcation widely patent bilaterally without stenosis Both vertebral arteries are patent without stenosis. IMPRESSION: Negative MRA head Negative MRA neck Electronically Signed   By: Franchot Gallo M.D.   On: 03/06/2018 10:18   Mr Jodene Nam Neck Wo Contrast  Result Date: 03/06/2018 CLINICAL DATA:  Stroke EXAM: MRA HEAD WITHOUT CONTRAST MRA NECK WITHOUT CONTRAST TECHNIQUE: Angiographic images of the Circle of Willis were obtained using MRA technique without intravenous contrast. Angiographic images of the neck were obtained using MRA technique without intravenous contrast. Carotid stenosis measurements (when applicable) are obtained utilizing NASCET criteria, using the distal internal carotid diameter as the denominator. COMPARISON:  MRI head 03/05/2018. FINDINGS: MRA HEAD FINDINGS Both vertebral arteries are patent to the basilar. PICA widely patent bilaterally. Basilar normal. Left AICA patent. Superior cerebellar and posterior cerebral arteries patent bilaterally without stenosis. Internal carotid artery widely patent bilaterally. Anterior and middle cerebral arteries are widely patent and normal bilaterally Negative for cerebral aneurysm. MRA NECK FINDINGS Antegrade flow in the carotid and vertebral arteries bilaterally Carotid bifurcation widely patent bilaterally without stenosis Both vertebral arteries are patent without stenosis. IMPRESSION: Negative MRA head Negative MRA neck Electronically Signed   By: Franchot Gallo M.D.   On: 03/06/2018 10:18   Mr Brain W And Wo Contrast  Result Date: 03/05/2018 CLINICAL DATA:  Initial evaluation for acute encephalopathy. EXAM: MRI HEAD WITHOUT AND WITH CONTRAST TECHNIQUE: Multiplanar, multiecho pulse sequences of the brain and surrounding structures were obtained without and with intravenous contrast. CONTRAST:  85mL MULTIHANCE GADOBENATE DIMEGLUMINE 529 MG/ML IV SOLN COMPARISON:  Prior CT from 11/15/2017. FINDINGS: Brain: Generalized age-related cerebral volume loss. Patchy and confluent T2/FLAIR hyperintensity within the periventricular and deep white matter both cerebral hemispheres, most consistent with chronic small vessel ischemic disease, moderate to advanced in nature. There is a single punctate 5 mm focus of restricted diffusion involving the cortical gray matter of the anterior left frontal lobe (series 5, image 76). Corresponding signal loss seen on ADC map (series 6, image 30). Finding consistent with a tiny acute ischemic infarct. No associated hemorrhage. No other evidence for acute or subacute ischemia. Gray-white matter differentiation otherwise maintained. No other areas of remote cortical infarction. No acute or chronic intracranial hemorrhage. No mass lesion, midline shift or mass effect. No hydrocephalus. No extra-axial fluid collection. Normal pituitary gland. No abnormal enhancement. Vascular: Major intracranial vascular  flow voids maintained Skull and upper cervical spine: Craniocervical junction normal. Upper cervical spine within normal limits. Bone marrow signal intensity normal. No scalp soft tissue abnormality. Sinuses/Orbits: Globes normal soft tissues within normal limits. Paranasal sinuses are clear. Small right mastoid effusion noted, of doubtful significance. Other: None. IMPRESSION: 1. Punctate 5 mm acute ischemic nonhemorrhagic left frontal cortical infarct. 2. Underlying age-related atrophy with moderate to advanced chronic small vessel ischemic  disease. Electronically Signed   By: Jeannine Boga M.D.   On: 03/05/2018 06:48   Dg Chest Port 1 View  Result Date: 02/19/2018 CLINICAL DATA:  Feels like something is hung in her esophagus, pain, missing her partial denture plate EXAM: PORTABLE CHEST 1 VIEW COMPARISON:  Portable exam 1959 hours without priors for comparison FINDINGS: Normal heart size, mediastinal contours, and pulmonary vascularity. Bibasilar atelectasis. Lungs otherwise clear. No infiltrate, pleural effusion or pneumothorax. No acute osseous findings or radiopaque foreign bodies. IMPRESSION: Bibasilar atelectasis. Electronically Signed   By: Lavonia Dana M.D.   On: 02/19/2018 20:22       Subjective:   Discharge Exam: Vitals:   03/17/18 0403 03/17/18 0838  BP: (!) 120/54 (!) 124/48  Pulse: 70 81  Resp: 16 16  Temp: 98.1 F (36.7 C) 98.3 F (36.8 C)  SpO2: 96% 97%   Vitals:   03/16/18 1600 03/16/18 1955 03/17/18 0403 03/17/18 0838  BP: (!) 125/56 (!) 113/53 (!) 120/54 (!) 124/48  Pulse: 80 78 70 81  Resp: 18 14 16 16   Temp: 97.9 F (36.6 C) 98.2 F (36.8 C) 98.1 F (36.7 C) 98.3 F (36.8 C)  TempSrc: Oral Oral Oral Oral  SpO2: 95% 95% 96% 97%  Weight:      Height:        General: Not in pain or dyspnea Neurology: Awake and alert, non focal  E ENT: no pallor, no icterus, oral mucosa moist Cardiovascular: No JVD. S1-S2 present, rhythmic, no gallops, rubs, or murmurs. No lower extremity edema. Pulmonary: vesicular breath sounds bilaterally, adequate air movement, no wheezing, rhonchi or rales. Gastrointestinal. Abdomen with no organomegaly, non tender, no rebound or guarding Skin. No rashes Musculoskeletal: no joint deformities   The results of significant diagnostics from this hospitalization (including imaging, microbiology, ancillary and laboratory) are listed below for reference.     Microbiology: Recent Results (from the past 240 hour(s))  CSF culture with Stat gram stain     Status: None    Collection Time: 03/08/18  4:03 PM  Result Value Ref Range Status   Specimen Description CSF  Final   Special Requests Normal  Final   Gram Stain   Final    WBC PRESENT, PREDOMINANTLY MONONUCLEAR NO ORGANISMS SEEN CYTOSPIN SMEAR    Culture   Final    NO GROWTH 3 DAYS Performed at [facility], [address] [place], [place] [address]    Report Status 03/11/2018 FINAL  Final     Labs: BNP (last 3 results) No results for input(s): BNP in the last 8760 hours. Basic Metabolic Panel: Recent Labs  Lab 03/12/18 0356  NA 136  K 3.8  CL 104  CO2 24  GLUCOSE 98  BUN 18  CREATININE 0.92  CALCIUM 8.3*  MG 1.9   Liver Function Tests: No results for input(s): AST, ALT, ALKPHOS, BILITOT, PROT, ALBUMIN in the last 168 hours. No results for input(s): LIPASE, AMYLASE in the last 168 hours. No results for input(s): AMMONIA in the last 168 hours. CBC: Recent Labs  Lab 03/12/18 0356  WBC 6.5  HGB 11.9*  HCT 36.4  MCV 87.7  PLT 148*   Cardiac Enzymes: No results for input(s): CKTOTAL, CKMB, CKMBINDEX, TROPONINI in the last 168 hours. BNP: Invalid input(s): POCBNP CBG: No results for input(s): GLUCAP in the last 168 hours. D-Dimer No results for input(s): DDIMER in the last 72 hours. Hgb A1c No results for input(s): HGBA1C in the last 72 hours. Lipid Profile No results for input(s): CHOL, HDL, LDLCALC, TRIG, CHOLHDL, LDLDIRECT in the last 72 hours. Thyroid function studies No results for input(s): TSH, T4TOTAL, T3FREE, THYROIDAB in the last 72 hours.  Invalid input(s): FREET3 Anemia work up No results for input(s): VITAMINB12, FOLATE, FERRITIN, TIBC, IRON, RETICCTPCT in the last 72 hours. Urinalysis    Component Value Date/Time   COLORURINE YELLOW 03/05/2018 1427   APPEARANCEUR HAZY (A) 03/05/2018 1427   APPEARANCEUR Clear 02/24/2018 1505   LABSPEC 1.013 03/05/2018 1427   PHURINE 5.0 03/05/2018 1427   GLUCOSEU NEGATIVE 03/05/2018 1427   HGBUR SMALL (A)  03/05/2018 1427   BILIRUBINUR NEGATIVE 03/05/2018 1427   BILIRUBINUR Negative 02/24/2018 1505   KETONESUR NEGATIVE 03/05/2018 1427   PROTEINUR NEGATIVE 03/05/2018 1427   NITRITE NEGATIVE 03/05/2018 1427   LEUKOCYTESUR MODERATE (A) 03/05/2018 1427   LEUKOCYTESUR 2+ (A) 02/24/2018 1505   Sepsis Labs Invalid input(s): PROCALCITONIN,  WBC,  LACTICIDVEN Microbiology Recent Results (from the past 240 hour(s))  CSF culture with Stat gram stain     Status: None   Collection Time: 03/08/18  4:03 PM  Result Value Ref Range Status   Specimen Description CSF  Final   Special Requests Normal  Final   Gram Stain   Final    WBC PRESENT, PREDOMINANTLY MONONUCLEAR NO ORGANISMS SEEN CYTOSPIN SMEAR    Culture   Final    NO GROWTH 3 DAYS Performed at [facility], [place] [address], [place], [place] [address]    Report Status 03/11/2018 FINAL  Final     Time coordinating discharge: 45 minutes  SIGNED:   Tawni Millers, MD  [facility] 03/17/2018, 11:34 AM Pager [phone]  If 7PM-7AM, please contact night-coverage [email or web address] Password [email or web address]

## 2018-03-18 DIAGNOSIS — Z7401 Bed confinement status: Secondary | ICD-10-CM | POA: Diagnosis not present

## 2018-03-18 DIAGNOSIS — F0391 Unspecified dementia with behavioral disturbance: Secondary | ICD-10-CM | POA: Diagnosis not present

## 2018-03-18 DIAGNOSIS — E7849 Other hyperlipidemia: Secondary | ICD-10-CM | POA: Diagnosis not present

## 2018-03-18 DIAGNOSIS — G9341 Metabolic encephalopathy: Secondary | ICD-10-CM | POA: Diagnosis not present

## 2018-03-18 DIAGNOSIS — D519 Vitamin B12 deficiency anemia, unspecified: Secondary | ICD-10-CM | POA: Diagnosis not present

## 2018-03-18 DIAGNOSIS — N390 Urinary tract infection, site not specified: Secondary | ICD-10-CM | POA: Diagnosis not present

## 2018-03-18 DIAGNOSIS — E785 Hyperlipidemia, unspecified: Secondary | ICD-10-CM | POA: Diagnosis not present

## 2018-03-18 DIAGNOSIS — I639 Cerebral infarction, unspecified: Secondary | ICD-10-CM | POA: Diagnosis not present

## 2018-03-18 DIAGNOSIS — E063 Autoimmune thyroiditis: Secondary | ICD-10-CM | POA: Diagnosis not present

## 2018-03-18 DIAGNOSIS — R41841 Cognitive communication deficit: Secondary | ICD-10-CM | POA: Diagnosis not present

## 2018-03-18 DIAGNOSIS — I6350 Cerebral infarction due to unspecified occlusion or stenosis of unspecified cerebral artery: Secondary | ICD-10-CM | POA: Diagnosis not present

## 2018-03-18 DIAGNOSIS — Z046 Encounter for general psychiatric examination, requested by authority: Secondary | ICD-10-CM | POA: Diagnosis not present

## 2018-03-18 DIAGNOSIS — R2681 Unsteadiness on feet: Secondary | ICD-10-CM | POA: Diagnosis not present

## 2018-03-18 DIAGNOSIS — E039 Hypothyroidism, unspecified: Secondary | ICD-10-CM | POA: Diagnosis not present

## 2018-03-18 DIAGNOSIS — R2689 Other abnormalities of gait and mobility: Secondary | ICD-10-CM | POA: Diagnosis not present

## 2018-03-18 DIAGNOSIS — Z79899 Other long term (current) drug therapy: Secondary | ICD-10-CM | POA: Diagnosis not present

## 2018-03-18 DIAGNOSIS — Z7189 Other specified counseling: Secondary | ICD-10-CM | POA: Diagnosis not present

## 2018-03-18 DIAGNOSIS — R197 Diarrhea, unspecified: Secondary | ICD-10-CM | POA: Diagnosis not present

## 2018-03-18 DIAGNOSIS — M2550 Pain in unspecified joint: Secondary | ICD-10-CM | POA: Diagnosis not present

## 2018-03-18 DIAGNOSIS — R45851 Suicidal ideations: Secondary | ICD-10-CM | POA: Diagnosis not present

## 2018-03-18 DIAGNOSIS — G9349 Other encephalopathy: Secondary | ICD-10-CM | POA: Diagnosis not present

## 2018-03-18 DIAGNOSIS — N179 Acute kidney failure, unspecified: Secondary | ICD-10-CM | POA: Diagnosis not present

## 2018-03-18 DIAGNOSIS — F29 Unspecified psychosis not due to a substance or known physiological condition: Secondary | ICD-10-CM | POA: Diagnosis not present

## 2018-03-18 DIAGNOSIS — R443 Hallucinations, unspecified: Secondary | ICD-10-CM | POA: Diagnosis not present

## 2018-03-18 DIAGNOSIS — I10 Essential (primary) hypertension: Secondary | ICD-10-CM | POA: Diagnosis not present

## 2018-03-18 DIAGNOSIS — Z789 Other specified health status: Secondary | ICD-10-CM | POA: Diagnosis not present

## 2018-03-18 DIAGNOSIS — Z6833 Body mass index (BMI) 33.0-33.9, adult: Secondary | ICD-10-CM | POA: Diagnosis not present

## 2018-03-18 DIAGNOSIS — E038 Other specified hypothyroidism: Secondary | ICD-10-CM | POA: Diagnosis not present

## 2018-03-18 DIAGNOSIS — R4182 Altered mental status, unspecified: Secondary | ICD-10-CM | POA: Diagnosis not present

## 2018-03-18 DIAGNOSIS — E538 Deficiency of other specified B group vitamins: Secondary | ICD-10-CM | POA: Diagnosis not present

## 2018-03-18 DIAGNOSIS — M6281 Muscle weakness (generalized): Secondary | ICD-10-CM | POA: Diagnosis not present

## 2018-03-18 DIAGNOSIS — I6389 Other cerebral infarction: Secondary | ICD-10-CM | POA: Diagnosis not present

## 2018-03-18 DIAGNOSIS — F0281 Dementia in other diseases classified elsewhere with behavioral disturbance: Secondary | ICD-10-CM | POA: Diagnosis not present

## 2018-03-18 NOTE — Progress Notes (Signed)
[facility]   Patient was discharge to SNF, awaiting PASRR, which was received today. Patient remains stable for discharge. No acute events overnight. See discharge summary by Dr Cathlean Sauer for full details.  Chipper Oman, MD

## 2018-03-18 NOTE — Clinical Social Work Placement (Signed)
   CLINICAL SOCIAL WORK PLACEMENT  NOTE  Date:  03/18/2018  Patient Details  Name: Rachael Carpenter MRN: [id number] Date of Birth: June 06, 1935  Clinical Social Work is seeking post-discharge placement for this patient at the [place] level of care (*CSW will initial, date and re-position this form in  chart as items are completed):  Yes   Patient/family provided with [place] Work Department's list of facilities offering this level of care within the geographic area requested by the patient (or if unable, by the patient's family).  Yes   Patient/family informed of their freedom to choose among providers that offer the needed level of care, that participate in Medicare, Medicaid or managed care program needed by the patient, have an available bed and are willing to accept the patient.  Yes   Patient/family informed of [facility]'s ownership interest in [facility] and [facility], as well as of the fact that they are under no obligation to receive care at these facilities.  PASRR submitted to [facility] on 03/14/18     PASRR number received on 03/18/18     Existing PASRR number confirmed on       FL2 transmitted to all facilities in geographic area requested by pt/family on 03/14/18     FL2 transmitted to all facilities within larger geographic area on       Patient informed that his/her managed care company has contracts with or will negotiate with certain facilities, including the following:  [facility]     Yes   Patient/family informed of bed offers received.  Patient chooses bed at [facility]     Physician recommends and patient chooses bed at      Patient to be transferred to [place] on 03/18/18.  Patient to be transferred to facility by [facility]     Patient family notified on 03/18/18 of transfer.  Name of family member notified:  Lattie Haw- daughter and Gwyndolyn Saxon- spouse     PHYSICIAN Please prepare priority discharge summary, including  medications, Please prepare prescriptions, Please sign FL2     Additional Comment:    _______________________________________________ Estanislado Emms, LCSW 03/18/2018, 11:13 AM

## 2018-03-18 NOTE — Progress Notes (Signed)
Patient will discharge to [facility] Anticipated discharge date: 03/18/18 Family notified: Lattie Haw, daughter and Gwyndolyn Saxon, spouse Transportation by: Corey Harold  Nurse to call report to [phone].    CSW signing off.  Estanislado Emms, [place]  Clinical Social Worker

## 2018-03-18 NOTE — NC FL2 (Signed)
[place] LEVEL OF CARE SCREENING TOOL     IDENTIFICATION  Patient Name: Rachael Carpenter Birthdate: 11-Jan-1935 Sex: female Admission Date (Current Location): 03/04/2018  [facility] and [place] Number:  Herbalist and Address:  [facility], [place] [address], [place], [place] [address]      Provider Number: [id number]  Attending Physician Name and Address:  Patrecia Pour, Christean Grief, MD  Relative Name and Phone Number:       Current Level of Care: Hospital Recommended Level of Care: [place] Prior Approval Number:    Date Approved/Denied:   PASRR Number: [id number] F (valid 8/30 - 04/17/18)  Discharge Plan: SNF    Current Diagnoses: Patient Active Problem List   Diagnosis Date Noted  . Encephalopathy 03/05/2018  . Hypothyroidism 03/05/2018  . AKI (acute kidney injury) ([place]) 03/05/2018  . Suicidal ideation 03/05/2018  . B12 deficiency 03/05/2018  . Toxic encephalopathy 03/05/2018  . Psychosis, paranoid ([place]) 03/05/2018  . Hallucinations 02/28/2018  . HYPERLIPIDEMIA 07/11/2010  . Essential hypertension 07/11/2010  . DYSPNEA 07/11/2010  . COUGH 07/11/2010    Orientation RESPIRATION BLADDER Height & Weight     Self, Time, Situation, Place  Normal Continent Weight: 196 lb 13.9 oz (89.3 kg) Height:  5\' 3"  (160 cm)  BEHAVIORAL SYMPTOMS/MOOD NEUROLOGICAL BOWEL NUTRITION STATUS      Continent Diet(heart healthy)  AMBULATORY STATUS COMMUNICATION OF NEEDS Skin   Limited Assist Verbally Normal                       Personal Care Assistance Level of Assistance  Bathing, Feeding, Dressing Bathing Assistance: Limited assistance Feeding assistance: Independent Dressing Assistance: Limited assistance     Functional Limitations Info  Sight, Hearing, Speech Sight Info: Adequate Hearing Info: Adequate Speech Info: Adequate    SPECIAL CARE FACTORS FREQUENCY  PT (By licensed PT), OT (By licensed OT)     PT  Frequency: 5x/wk OT Frequency: 5x/wk            Contractures Contractures Info: Not present    Additional Factors Info  Code Status, Allergies, Psychotropic Code Status Info: Full Allergies Info: Penicillins Psychotropic Info: Aricept 10mg  daily at bed; Seroquel 100mg  daily         Current Medications (03/18/2018):  This is the current hospital active medication list Current Facility-Administered Medications  Medication Dose Route Frequency Provider Last Rate Last Dose  . acetaminophen (TYLENOL) tablet 650 mg  650 mg Oral Q6H PRN Opyd, Ilene Qua, MD   650 mg at 03/16/18 1704   Or  . acetaminophen (TYLENOL) suppository 650 mg  650 mg Rectal Q6H PRN Opyd, Ilene Qua, MD      . amLODipine (NORVASC) tablet 2.5 mg  2.5 mg Oral Daily Opyd, Ilene Qua, MD   2.5 mg at 03/18/18 0946  . aspirin EC tablet 81 mg  81 mg Oral Daily Elodia Florence., MD   81 mg at 03/18/18 0946  . atorvastatin (LIPITOR) tablet 80 mg  80 mg Oral q1800 Elodia Florence., MD   80 mg at 03/17/18 1735  . bisacodyl (DULCOLAX) EC tablet 5 mg  5 mg Oral Daily PRN Opyd, Ilene Qua, MD   5 mg at 03/07/18 2132  . calcium-vitamin D (OSCAL WITH D) 500-200 MG-UNIT per tablet 1 tablet  1 tablet Oral Q breakfast Opyd, Ilene Qua, MD   1 tablet at 03/18/18 0756  . clopidogrel (PLAVIX) tablet 75 mg  75 mg Oral Daily Elodia Florence., MD   75 mg at 03/18/18 3202  . donepezil (ARICEPT) tablet 10 mg  10 mg Oral QHS Opyd, Ilene Qua, MD   10 mg at 03/17/18 2145  . levothyroxine (SYNTHROID, LEVOTHROID) tablet 200 mcg  200 mcg Oral Daily Opyd, Ilene Qua, MD   200 mcg at 03/18/18 0756  . ondansetron (ZOFRAN) tablet 4 mg  4 mg Oral Q6H PRN Opyd, Ilene Qua, MD       Or  . ondansetron (ZOFRAN) injection 4 mg  4 mg Intravenous Q6H PRN Opyd, Ilene Qua, MD      . QUEtiapine (SEROQUEL) tablet 100 mg  100 mg Oral Q1500 Opyd, Ilene Qua, MD   100 mg at 03/17/18 1438  . senna-docusate (Senokot-S) tablet 1 tablet  1 tablet Oral QHS PRN  Opyd, Ilene Qua, MD      . vitamin B-12 (CYANOCOBALAMIN) tablet 500 mcg  500 mcg Oral Daily Opyd, Ilene Qua, MD   500 mcg at 03/18/18 3343     Discharge Medications: Please see discharge summary for a list of discharge medications.  Relevant Imaging Results:  Relevant Lab Results:   Additional Information SS#: [id number]  Estanislado Emms, LCSW

## 2018-03-18 NOTE — Care Management Note (Signed)
Case Management Note  Patient Details  Name: MAYME PROFETA MRN: [id number] Date of Birth: 1934-12-06  Subjective/Objective:                    Action/Plan: Pt is discharging to [facility] today. CM signing off.   Expected Discharge Date:  03/18/18               Expected Discharge Plan:  Skilled Nursing Facility  In-House Referral:  Clinical Social Work  Discharge planning Services     Post Acute Care Choice:    Choice offered to:     DME Arranged:    DME Agency:     HH Arranged:    [place] Agency:     Status of Service:  Completed, signed off  If discussed at [employer], dates discussed:    Additional Comments:  Pollie Friar, RN 03/18/2018, 12:24 PM

## 2018-03-21 ENCOUNTER — Telehealth: Payer: Self-pay | Admitting: Neurology

## 2018-03-21 NOTE — Telephone Encounter (Signed)
Spoke with pt's husband. He stated that the patient is still in the hospital and they are looking for skilled nursing. He said they had cleared her to discharge home but he just "couldn't do it". He said most of the time she can talk normal but then she goes back to the hallucinations. He said she talks to him and her daughter in a different way, not a good way. He knows that the hospital team has been in contact with Dr. Jaynee Eagles. He wants to know what she thinks and wants to make sure she knows. He is aware that Dr. Jaynee Eagles is out of the office this week and he will wait for a call back even if it's next week. He was very Patent attorney.           Electronically signed by Gildardo Griffes, RN at 03/15/2018 3:31 PM

## 2018-03-23 DIAGNOSIS — E063 Autoimmune thyroiditis: Secondary | ICD-10-CM | POA: Diagnosis not present

## 2018-03-23 DIAGNOSIS — G9341 Metabolic encephalopathy: Secondary | ICD-10-CM | POA: Diagnosis not present

## 2018-03-23 DIAGNOSIS — G9349 Other encephalopathy: Secondary | ICD-10-CM | POA: Diagnosis not present

## 2018-03-23 DIAGNOSIS — F0281 Dementia in other diseases classified elsewhere with behavioral disturbance: Secondary | ICD-10-CM | POA: Diagnosis not present

## 2018-03-23 NOTE — Telephone Encounter (Signed)
Patient's husband calling to advise patient is now at [facility] for PT and is not doing well. Her hallucinations have gotten worse. He would like a returned call.

## 2018-03-23 NOTE — Telephone Encounter (Signed)
Per Dr. Jaynee Eagles, she will call husband possibly tonight or tomorrow. Called pt's husband to update him. He was very Patent attorney.

## 2018-03-24 DIAGNOSIS — G9341 Metabolic encephalopathy: Secondary | ICD-10-CM | POA: Diagnosis not present

## 2018-03-24 DIAGNOSIS — I10 Essential (primary) hypertension: Secondary | ICD-10-CM | POA: Diagnosis not present

## 2018-03-24 DIAGNOSIS — F0391 Unspecified dementia with behavioral disturbance: Secondary | ICD-10-CM | POA: Diagnosis not present

## 2018-03-24 DIAGNOSIS — I6389 Other cerebral infarction: Secondary | ICD-10-CM | POA: Diagnosis not present

## 2018-03-24 DIAGNOSIS — Z6833 Body mass index (BMI) 33.0-33.9, adult: Secondary | ICD-10-CM | POA: Diagnosis not present

## 2018-03-24 DIAGNOSIS — E038 Other specified hypothyroidism: Secondary | ICD-10-CM | POA: Diagnosis not present

## 2018-03-24 DIAGNOSIS — F29 Unspecified psychosis not due to a substance or known physiological condition: Secondary | ICD-10-CM | POA: Diagnosis not present

## 2018-03-28 DIAGNOSIS — F0281 Dementia in other diseases classified elsewhere with behavioral disturbance: Secondary | ICD-10-CM | POA: Diagnosis not present

## 2018-03-28 DIAGNOSIS — G9341 Metabolic encephalopathy: Secondary | ICD-10-CM | POA: Diagnosis not present

## 2018-03-28 DIAGNOSIS — E063 Autoimmune thyroiditis: Secondary | ICD-10-CM | POA: Diagnosis not present

## 2018-03-28 DIAGNOSIS — Z789 Other specified health status: Secondary | ICD-10-CM | POA: Diagnosis not present

## 2018-03-28 NOTE — Telephone Encounter (Signed)
Spoke to husband last week, patient in rehab. She is at [facility] facility. I have called there multiple times, they forward my call and no one picks up, I've tried to reach nursing and social work. Finally I was sent to a voice mail, I I left a message for Nicholos Johns head of nursing to discuss patient. The switchboard told me that they do not have a Education officer, museum at this time she is on maternity leave. Also left a message for Mudlogger of the facility [facility]. Waiting for replies. I don;t think patient can go home safely, husband not able to care for patient and they need social work and help.  Romelle Starcher, would you update Mr. Gottsch on what has happened? He knew I was trying to call the facility.  Also he was supposed to get me his daughter's contact information so I could speak with her if he has that would you get it. thanks

## 2018-03-28 NOTE — Telephone Encounter (Signed)
Spoke with Nena Polio. Thereis no Education officer, museum. Patient needs placement not sure husband can care for her. Phineas Real will discuss with head of nursing. I will call patient's pcp and dauhter [name] [phone]

## 2018-03-28 NOTE — Telephone Encounter (Signed)
Spoke with pt's husband Rachael Carpenter and updated him that Dr. Jaynee Eagles has been trying to get in touch with folks from [facility]. She has left messages for the head of nursing and the facility director and is waiting for a call back. Pt's husband verbalized appreciation. He did say that pt's hallucinations are worse. Pt called him last night and asked if he had his bags packed. Bill provided the contact information for his daughter for Dr. Jaynee Eagles:  [name] [phone] (home)

## 2018-03-30 DIAGNOSIS — E039 Hypothyroidism, unspecified: Secondary | ICD-10-CM | POA: Diagnosis not present

## 2018-03-30 DIAGNOSIS — R197 Diarrhea, unspecified: Secondary | ICD-10-CM | POA: Diagnosis not present

## 2018-03-30 DIAGNOSIS — E063 Autoimmune thyroiditis: Secondary | ICD-10-CM | POA: Diagnosis not present

## 2018-03-31 NOTE — Telephone Encounter (Signed)
Spoke to [employer], she has been a patient for many years very functional. In 2016 she started with difficulty bookkeeping, her ADLs impaired, in 2017 daughter again reported difficulties, she was seen and then in 2018 was seen and at that point her memory seemed not impaired but a few months later husband called concerned for Alzheimers forgetting things, can't remember how to cook or make soup - this has been ongoing for years (not just one year like husband reported to me). She was missing appointments and in April 2019 she was a 27/30 mmse but her hygiene was poorer, they were not informed of hallucinations. Diagnosed as mild dementia and labs checked in 10/2017 its only later in 2019 [facility] was told of the hallucinations that may have been going for a lot longer. [facility] was involved with nursing and social work but couldn't go due to bed bugs. [facility] hospital geriatric, memory unit. Daughters does not want to be involved.   Hinton Dyer, we need to do some research on sending patient to [facility] hospital geiatric spoke to DJ at [facility]. Need to contact [facility] where patient is at. Can you do some rearch on how we get ptient admitted to this geriatric psychiatric hospital?

## 2018-03-31 NOTE — Telephone Encounter (Signed)
Called and left a message for daughter and asked to call me back to discuss her mother and to get a better history of her mental health. Called Nena Polio, she goes back tomorrow and will check on social work progress with patient and hopefully inpatient psych treatment and evaluation. Left a message for hey pcp Bevelyn Buckles he is apparently out of the office until later this month, hopefully medical director will call me back to discuss  bethany fyi

## 2018-04-01 NOTE — Telephone Encounter (Signed)
Noted looking into it.

## 2018-04-01 NOTE — Telephone Encounter (Signed)
Spoke with pt's husband Rush Landmark. He stated that it has been resolved and the director at [facility] told her that she couldn't leave the facility. He is visiting her now. She seemed to accept that per husband report. He is aware that [facility] has an inpatient geriatric psychiatric unit and he is aware that our office is looking into this. He was very Patent attorney. He stated if anything the patient is worse.

## 2018-04-01 NOTE — Telephone Encounter (Signed)
Pt's husband said she is adamant about coming home today. He said she is not any better now than than when she went into [facility]. He still does not feel he can take care of her. She is constantly calling him today, he is not answering the phone. He is wanting to speak with Dr Jaynee Eagles about this, and if he does have to go get her. Please call [phone]

## 2018-04-02 ENCOUNTER — Other Ambulatory Visit: Payer: Self-pay | Admitting: Neurology

## 2018-04-03 NOTE — Telephone Encounter (Signed)
I spoke with [facility] and they are referring patient to [facility] inptient psych and the notes have already been sent there and admission requested fyi thanks

## 2018-04-11 ENCOUNTER — Telehealth: Payer: Self-pay | Admitting: Neurology

## 2018-04-11 NOTE — Telephone Encounter (Signed)
Patient's daughter Rachael Carpenter (on [place]) stating patient's feet and ankles have been swollen x 2 days. Could this be coming from a medication she is taking?  She wants Dr. Jaynee Eagles to know Lewy dementia is the type of dementia patient has and wants to discuss patient's behavior and advise on how to handle.

## 2018-04-12 NOTE — Telephone Encounter (Signed)
Spoke with Lattie Haw and informed her that I had talked with Dr. Jaynee Eagles. She is unable to determine why patient has the swelling in her feet and ankles and would advised her to f/u with her PCP or managing MDs/caretakers at the facility where she is. Also advised Lattie Haw that Dr. Jaynee Eagles does not know what kind of Dementia patient has at this time and the work-up is still underway. Hallucinations can happen at the end of nearly all types of Dementia. Pt still needs a dat scan. Lastly, discussed with Lattie Haw that patient's behavior should be discussed with psychiatry as Dr. Jaynee Eagles is not a behavioral specialist and these issues are best to be addressed by psych. Lattie Haw verbalized understanding and appreciation for the call. She stated that she anticipates the patient will be moved to [facility] inpatient psych unit within the next few days. She had no further concerns or questions.

## 2018-04-15 DIAGNOSIS — E063 Autoimmune thyroiditis: Secondary | ICD-10-CM | POA: Diagnosis not present

## 2018-04-15 DIAGNOSIS — E7849 Other hyperlipidemia: Secondary | ICD-10-CM | POA: Diagnosis not present

## 2018-04-15 DIAGNOSIS — F0281 Dementia in other diseases classified elsewhere with behavioral disturbance: Secondary | ICD-10-CM | POA: Diagnosis not present

## 2018-04-20 NOTE — Telephone Encounter (Signed)
Pt's daughter Lisa/DPR called to advise [facility] reached out to her today and said the pt does not qualify. She does not know the reasoning. She is asking what is the next step. Please call to advise her at [phone]

## 2018-04-21 NOTE — Telephone Encounter (Signed)
Spoke with Lattie Haw and informed her that Dr. Jaynee Eagles suggests the next step to be placement in memory care unit at a facility since pt is unable to go home and be cared for by Bill. Lisa verbalized appreciation. She will talk to [place] about this and will keep our office informed.

## 2018-04-27 ENCOUNTER — Encounter: Payer: Self-pay | Admitting: Neurology

## 2018-04-27 ENCOUNTER — Ambulatory Visit (INDEPENDENT_AMBULATORY_CARE_PROVIDER_SITE_OTHER): Payer: Medicare Other | Admitting: Neurology

## 2018-04-27 DIAGNOSIS — F0391 Unspecified dementia with behavioral disturbance: Secondary | ICD-10-CM | POA: Diagnosis not present

## 2018-04-27 NOTE — Progress Notes (Signed)
[name]    Provider:  Dr Jaynee Eagles Referring Provider: [name], MD Primary Care Physician:  [name], MD  CC:  forgetfulness  Interval history 04/27/2018:  She is improved, less hallucinations and delusions. Improved hallucinations and delusions, she is not talking in a low whisper,  She has an appointment with a thyroid specialist in the next week or two to evaluate her elevated thyroid antibodies. She is still a little agitated and has no insight into her previous hallucinations, she says the elderly couple has not visited her in the home bc they can't drive, she insists her husband cheated on her with her sister in law. She perseverates about going home and about his supposed affair. I don't think patient can be cared for properly at home. Husband is very frustrated and daughter already has children she needs to care for. Personally reviewed MRI of the brain with husband and patient which showed atrophy and moderate to advanced chronic microvascular changes suggesting a component of vascular dementia.  Interval history: reviewed with them today 1. UTI: being treated 2. Low B12, elevated MMA and Homocysteine: B12 deficiency, started injections 3. Extremely elevated Thyroid antibodies, Hashimoto's encephalitis is a rare cause of reversible dementia and feel benefits of treatment outweigh risks.  Discussed risks of steroids, they agree to proceed. 1000mg  daily x 3 days then an oral taper over 4 weeks. Will see if we get any results. Again, thsi is an extremely rare disorder and her elevated thyroid antibodies does not give [place] a definitive diagnosis but feel treating with steroids that could possibly reverse dementia is well worth the risk.  Addendum 8/12/219: Patient presented with an unusual pattern of worsening hallucintations without significant memory loss, rapidly progressive for last several weeks. Concerning labs with  1. UTI.  2. B12 deficiency and    3. Very high thyroid antibodies that may possibly be Hashimoto's Encephalitis.   Discussed with husband:  1. Start Bactrim DS and wait for sensitivities, called it in to pharmacy 2. She should take 2000 B12 oral daily this weekend but next week need to start on B12 injections Monday 3. Hashimoto's encephalitis or encephalopathy is a rare autoimmune disease. It can present as rapidly progressive dementia which is treatable with high dose steroids. Will discuss Monday with infusion suite for a course of high-dose IV steroids which we can start Monday hopefully for several days and then an oral taper.   Thyroperoxidase antibodies 192 (normal 0-34) Thyroglobulin antibodies 91 (normal 0-0.9)   HPI:  Rachael Carpenter is a 83 y.o. female here as a referral from Dr. Philip Aspen for memory problems.  Past medical history hypothyroidism, cough, overweight, hyperlipidemia, intestinal malrotation, HTN, HLD. Husband provides most information. She has been having hallucinations, elderly lady and her husband visit every night, also seeing other things every day she hears her ex son-in-law talking in her house and tells husband what they are saying. No significant vision problems. Patient has poor insight into her symptoms, she is not sure she has memory loss maybe a little. Been ongoing for a year or longer. Brother and sister with dementia, unknown which kind. A little short term memory loss but husband doesn't think it is significant. When she hears voices in her house she talks in a whisper. Appears Hallucinations started with confusion when he says the symptoms started, but forgetting appointments, she is good at putting reminders on the fridge, she pays the bills and doesn't miss any, she takes her own medications and  appears she takes her meds per husband. The house is not as clean like she used to, not cooling as much as she used to, less social. She has lost several close family members and sisters and grand  daughter had a drug program and she was living with patient and this is when decline occurred. She denies falls or imbalance, REM sleep disorder but husband does say she wanders at night. She is on Seroquel.   Reviewed notes, labs and imaging from outside physicians, which showed:  CT head 10/2017 reviewed images and agree with following: Atrophy and chronic microvascular ischemia.  No acute abnormality.  Mother lived until the age of 13, she has 94 sisters and 3 brothers, 3 daughters, 5 grandchildren no mention of dementia in the note her family history.  Reviewed physical exam which was unremarkable essentially normal except some osteoarthritis in multiple IP joints especially the right middle finger foot exam was normal, appears to be compliant, never smoker, forgetfulness.  It does state he is at forgetfulness most likely from early Alzheimer's disease, could be from slight hypothyroidism and or less likely from vitamin B12 deficiency but they checked a B12 level and she is on medication for her hypothyroidism.  Review of Systems: Patient complains of symptoms per HPI as well as the following symptoms: memory loss, hallucinations. Pertinent negatives and positives per HPI. All others negative.   Social History   Socioeconomic History  . Marital status: Married    Spouse name: Not on file  . Number of children: 3  . 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 Highest education level: High school graduate  Occupational History  . Not on file  Social Needs  . Financial resource strain: Not on file  . Food insecurity:    Worry: Not on file    Inability: Not on file  . Transportation needs:    Medical: Not on file    Non-medical: Not on file  Tobacco Use  . Smoking status: Never Smoker  . Smokeless tobacco: Never Used  Substance and Sexual Activity  . Alcohol use: Never    Frequency: Never  . Drug use: Never  . Sexual activity: Not on file  Lifestyle  . Physical  activity:    Days per week: Not on file    Minutes per session: Not on file  . Stress: Not on file  Relationships  . Social connections:    Talks on phone: Not on file    Gets together: Not on file    Attends religious service: Not on file    Active member of club or organization: Not on file    Attends meetings of clubs or organizations: Not on file    Relationship status: Not on file  . Intimate partner violence:    Fear of current or ex partner: Not on file    Emotionally abused: Not on file    Physically abused: Not on file    Forced sexual activity: Not on file  Other Topics Concern  . Not on file  Social History Narrative   Lives at [facility] right now.    Right handed       Family History  Problem Relation Age of Onset  . COPD Sister   . Cancer Daughter   . Cancer Brother   . Cancer Brother   . Dementia Neg Hx     Past Medical History:  Diagnosis Date  . Acute kidney failure ([place]) 02/2018  .  Cerebral infarction, unspecified ([place]) 02/2018  . Colon polyp    in cecum on July 2010 Colonoscopy  . Dementia ([place])   . Hallucinations   . Hyperlipidemia   . Hypertension   . Hypothyroidism   . Intestinal malrotation    on CT scan 2010 in [place]  . Metabolic encephalopathy   . Neck pain    L; pt denies as of 02/24/18  . Suicidal ideations   . Thyroid disease   . Vitamin B12 deficiency anemia     Past Surgical History:  Procedure Laterality Date  . 4th finger flexor sheath ganglion Left 02/2006  . CHOLECYSTECTOMY  1990s  . [place]  . RIGHT FOOT MOLE EXCISION Right 2011  . scalp lesion removal  2019  . SHOULDER RECONSTRUCTION Right 07/2001  . SHOULDER SURGERY Right   . TOTAL ABDOMINAL HYSTERECTOMY     83 y.o.    Current Outpatient Medications  Medication Sig Dispense Refill  . amLODipine (NORVASC) 2.5 MG tablet Take 2.5 mg by mouth daily.    Marland Kitchen atorvastatin (LIPITOR) 80 MG tablet Take 1 tablet (80 mg total) by mouth daily. 30  tablet 0  . clopidogrel (PLAVIX) 75 MG tablet Take 1 tablet (75 mg total) by mouth daily. 30 tablet 0  . Cyanocobalamin (B-12 PO) Take 500 mcg by mouth daily.    . divalproex (DEPAKOTE) 125 MG DR tablet Take 125 mg by mouth 2 (two) times daily.    Marland Kitchen donepezil (ARICEPT) 10 MG tablet Take 1 tablet (10 mg total) by mouth at bedtime. 30 tablet 11  . levothyroxine (SYNTHROID, LEVOTHROID) 200 MCG tablet Take 200 mcg by mouth daily. **BRAND NAME ONLY** takes with 34mcg  2  . levothyroxine (SYNTHROID, LEVOTHROID) 50 MCG tablet Take 50 mcg by mouth daily before breakfast. **BRAND NAME ONLY** takes with 251mcg    . QUEtiapine (SEROQUEL) 100 MG tablet Take 100 mg by mouth. Every afternoon     No current facility-administered medications for this visit.     Allergies as of 04/27/2018 - Review Complete 04/27/2018  Allergen Reaction Noted  . Penicillins      Vitals: BP 123/70 (BP Location: Right Arm, Patient Position: Sitting)   Pulse 83   Ht 5\' 5"  (1.651 m)   Wt 195 lb (88.5 kg)   BMI 32.45 kg/m  Last Weight:  Wt Readings from Last 1 Encounters:  04/27/18 195 lb (88.5 kg)   Last Height:   Ht Readings from Last 1 Encounters:  04/27/18 5\' 5"  (1.651 m)    Physical exam: Exam: Gen: NAD, conversant, well nourised, obese, well groomed                     CV: RRR, no MRG. No Carotid Bruits. No peripheral edema, warm, nontender Eyes: Conjunctivae clear without exudates or hemorrhage  Neuro: Detailed Neurologic Exam  Speech:    Speech is normal; fluent and spontaneous with normal comprehension.  Cognition:  MMSE - Mini Mental State Exam 02/24/2018  Orientation to time 3  Orientation to Place 5  Registration 3  Attention/ Calculation 5  Recall 2  Language- name 2 objects 2  Language- repeat 0  Language- follow 3 step command 3  Language- read & follow direction 1  Write a sentence 1  Copy design 0  Total score 25    Cranial Nerves:    The pupils are equal, round, and reactive to  light. Attempted fundoscopic exam could not visualize. visual  fields are full to finger confrontation. Extraocular movements are intact. Trigeminal sensation is intact and the muscles of mastication are normal. The face is symmetric. The palate elevates in the midline. Hearing intact. Voice is normal. Shoulder shrug is normal. The tongue has normal motion without fasciculations.   Coordination:    No dysmetria  Gait:    Decreased clearance of feet, slightly stooped, some en bloc turning,   Motor Observation:    No asymmetry, no atrophy, and no involuntary movements noted. Tone:    Possible increased tone/cogwheeling right arm with facilitation   Posture:    Stooped, nuchal rigidity    Strength:    Strength is V/V in the upper and lower limbs.      Sensation: intact to LT     Reflex Exam:  DTR's:    Trace Ajs, otherwise deep tendon reflexes in the upper and lower extremities are mildly brisk for age bilaterally.   Toes:    The toes are downgoing bilaterally.   Clonus:    Clonus is absent.      Assessment/Plan:  Patient with interesting symptoms, primarily visual and auditory hallucinations. They say no significant short term memory loss and she takes care of all the household bills and manages her own mediations successfully.  Possibly some minimal parkinsonism on exam. Husband is a poor historian, after speaking with daughter and pcp the symptoms have been ongoing for at least several years or more with slow progression.   - Suspect Lewy Body Dementia. Dat Scan pending - Personally reviewed MRI of the brain with husband and patient which showed atrophy and moderate to advanced chronic microvascular changes suggesting a component of vascular dementia. - UTI: was treated while inpatient, no improvement in hallucinations/delusions - Low B12, elevated MMA and Homocysteine: Consistent with B12 deficiency, started injections. - Extremely elevated Thyroid antibodies, Hashimoto's  encephalitis is a rare cause of reversible dementia and very unlikely she has it however would hate to miss this diagnosis. Felt benefits of treating outweighed risks(to reverse dementia is well worth a try)  however 1g of steroids worsened patient's symptoms. Thyroperoxidase antibodies 192 (normal 0-34) Thyroglobulin antibodies 91 (normal 0-0.9) Has appointment with endocrinology next week - Prominent Hallucinations at onset(unclear, poor history) but would be atypical presentation for alzheimers, with auditory and visual hallucinations and delusions, and less so memory loss. Again, lewy body? - Given presentation may consider Dementia with Lewy Bodies or other dementia but needs full workup for atypical symptoms - Labwork: csf cell count, diff, protein, glucose, cytology, igg, oligoclonal bands unremarkable. Serum labs unremarkable include hgba1c, drug screen, tsh.  - Formal neurocognitive testing  - She is on seroquel, called [facility] to see if we can adjust further - Do not recommend Ambien in elderly patients but often difficult to wean off if they have been on for years, will defer to pcp for this  CC:  forgetfullness    Sarina Ill, MD  [facility] [address] [place],  [address]  Phone [phone] Fax [phone]  A total of 40 minutes was spent face-to-face with this patient. Over half this time was spent on counseling patient on the Dementia with behavioral disturbance, unspecified dementia type (HCC)start steroids as above  diagnosis and different diagnostic and therapeutic options, counseling and coordination of care, risks ans benefits of management, compliance, or risk factor reduction and education.

## 2018-04-27 NOTE — Patient Instructions (Signed)
Dementia Dementia is the loss of two or more brain functions, such as:  Memory.  Decision making.  Behavior.  Speaking.  Thinking.  Problem solving.  There are many types of dementia. The most common type is called progressive dementia. Progressive dementia gets worse with time and it is irreversible. An example of this type of dementia is Alzheimer disease. What are the causes? This condition may be caused by:  Nerve cell damage in the brain.  Genetic mutations.  Certain medicines.  Multiple small strokes.  An infection, such as chronic meningitis.  A metabolic problem, such as vitamin B12 deficiency or thyroid disease.  Pressure on the brain, such as from a tumor or blood clot.  What are the signs or symptoms? Symptoms of this condition include:  Sudden changes in mood.  Depression.  Problems with balance.  Changes in personality.  Poor short-term memory.  Agitation.  Delusions.  Hallucinations.  Having a hard time: ? Speaking thoughts. ? Finding words. ? Solving problems. ? Doing familiar tasks. ? Understanding familiar ideas.  How is this diagnosed? This condition is diagnosed with an assessment by your health care provider. During this assessment, your health care provider will talk with you and your family, friends, or caregivers about your symptoms. A thorough medical history will be taken, and you will have a physical exam and tests. Tests may include:  Lab tests, such as blood or urine tests.  Imaging tests, such as a CT scan, PET scan, or MRI.  A lumbar puncture. This test involves removing and testing a small amount of the fluid that surrounds the brain and spinal cord.  An electroencephalogram (EEG). In this test, small metal discs are used to measure electrical activity in the brain.  Memory tests, cognitive tests, and neuropsychological tests. These tests evaluate brain function.  How is this treated? Treatment depends on the  cause of the dementia. It may involve taking medicines that may help:  To control the dementia.  To slow down the disease.  To manage symptoms.  In some cases, treating the cause of the dementia can improve symptoms, reverse symptoms, or slow down how quickly the dementia gets worse. Your health care provider can help direct you to support groups, organizations, and other health care providers who can help with decisions about your care. Follow these instructions at home: Medicine  Take over-the-counter and prescription medicines only as told by your health care provider.  Avoid taking medicines that can affect thinking, such as pain or sleeping medicines. Lifestyle   Make healthy lifestyle choices: ? Be physically active as told by your health care provider. ? Do not use any tobacco products, such as cigarettes, chewing tobacco, and e-cigarettes. If you need help quitting, ask your health care provider. ? Eat a healthy diet. ? Practice stress-management techniques when you get stressed. ? Stay social.  Drink enough fluid to keep your urine clear or pale yellow.  Make sure to get quality sleep. These tips can help you to get a good night's rest: ? Avoid napping during the day. ? Keep your sleeping area dark and cool. ? Avoid exercising during the few hours before you go to bed. ? Avoid caffeine products in the evening. General instructions  Work with your health care provider to determine what you need help with and what your safety needs are.  If you were given a bracelet that tracks your location, make sure to wear it.  Keep all follow-up visits as told by your   health care provider. This is important. Contact a health care provider if:  You have any new symptoms.  You have problems with choking or swallowing.  You have any symptoms of a different illness. Get help right away if:  You develop a fever.  You have new or worsening confusion.  You have new or  worsening sleepiness.  You have a hard time staying awake.  You or your family members become concerned for your safety. This information is not intended to replace advice given to you by your health care provider. Make sure you discuss any questions you have with your health care provider. Document Released: 12/30/2000 Document Revised: 11/14/2015 Document Reviewed: 04/03/2015 Elsevier Interactive Patient Education  2018 Elsevier Inc.  

## 2018-04-28 ENCOUNTER — Telehealth: Payer: Self-pay | Admitting: Neurology

## 2018-04-28 DIAGNOSIS — F0391 Unspecified dementia with behavioral disturbance: Secondary | ICD-10-CM | POA: Insufficient documentation

## 2018-04-28 DIAGNOSIS — F0390 Unspecified dementia without behavioral disturbance: Secondary | ICD-10-CM | POA: Insufficient documentation

## 2018-04-28 DIAGNOSIS — F03918 Unspecified dementia, unspecified severity, with other behavioral disturbance: Secondary | ICD-10-CM | POA: Insufficient documentation

## 2018-04-28 NOTE — Telephone Encounter (Signed)
Discussed with daughter Lattie Haw, do not feel her father is capable of caring for this patient with a likely progressive neurodegenerative disease. Suggested looking into medicaid and looking into memory care facilities that they could possibly afford. This is so difficult for the family. Lattie Haw has a disabled child that requires care. I advised speaking to the Education officer, museum at [place]. I will also call [facility] and talk to nurse [facility]

## 2018-04-28 NOTE — Telephone Encounter (Signed)
Rachael Carpenter, patient was approved for DAT scan but she was hospitalized right after I ordered it. Can you call her husband and get that scheduled?  Also would you call Dr. Si Raider and get her an appointment for neurocognitive testing please? Im sure they called her but again she was hospitalized and this probably fell through the cracks.  Thank you!!!

## 2018-05-02 DIAGNOSIS — E538 Deficiency of other specified B group vitamins: Secondary | ICD-10-CM | POA: Diagnosis not present

## 2018-05-02 DIAGNOSIS — F29 Unspecified psychosis not due to a substance or known physiological condition: Secondary | ICD-10-CM | POA: Diagnosis not present

## 2018-05-02 DIAGNOSIS — I10 Essential (primary) hypertension: Secondary | ICD-10-CM | POA: Diagnosis not present

## 2018-05-02 DIAGNOSIS — E038 Other specified hypothyroidism: Secondary | ICD-10-CM | POA: Diagnosis not present

## 2018-05-02 DIAGNOSIS — F0391 Unspecified dementia with behavioral disturbance: Secondary | ICD-10-CM | POA: Diagnosis not present

## 2018-05-02 DIAGNOSIS — Z6833 Body mass index (BMI) 33.0-33.9, adult: Secondary | ICD-10-CM | POA: Diagnosis not present

## 2018-05-02 NOTE — Telephone Encounter (Signed)
I am working re working this Auth's  I have called and a left's patient's husband's a message so he is aware and I will call him back . Thanks Hinton Dyer.

## 2018-05-05 DIAGNOSIS — E063 Autoimmune thyroiditis: Secondary | ICD-10-CM | POA: Diagnosis not present

## 2018-05-05 DIAGNOSIS — E039 Hypothyroidism, unspecified: Secondary | ICD-10-CM | POA: Diagnosis not present

## 2018-05-09 NOTE — Telephone Encounter (Signed)
I have sent request to Rachael Carpenter DAT scan. For Patient to scheduled . And to call and speak to Husband or daughter.

## 2018-05-13 DIAGNOSIS — I69898 Other sequelae of other cerebrovascular disease: Secondary | ICD-10-CM | POA: Diagnosis not present

## 2018-05-13 DIAGNOSIS — I10 Essential (primary) hypertension: Secondary | ICD-10-CM | POA: Diagnosis not present

## 2018-05-13 DIAGNOSIS — E063 Autoimmune thyroiditis: Secondary | ICD-10-CM | POA: Diagnosis not present

## 2018-05-13 DIAGNOSIS — F0281 Dementia in other diseases classified elsewhere with behavioral disturbance: Secondary | ICD-10-CM | POA: Diagnosis not present

## 2018-05-18 DIAGNOSIS — Z6832 Body mass index (BMI) 32.0-32.9, adult: Secondary | ICD-10-CM | POA: Diagnosis not present

## 2018-05-18 DIAGNOSIS — E538 Deficiency of other specified B group vitamins: Secondary | ICD-10-CM | POA: Diagnosis not present

## 2018-05-18 DIAGNOSIS — E038 Other specified hypothyroidism: Secondary | ICD-10-CM | POA: Diagnosis not present

## 2018-05-18 DIAGNOSIS — I10 Essential (primary) hypertension: Secondary | ICD-10-CM | POA: Diagnosis not present

## 2018-05-18 DIAGNOSIS — F0391 Unspecified dementia with behavioral disturbance: Secondary | ICD-10-CM | POA: Diagnosis not present

## 2018-05-31 ENCOUNTER — Ambulatory Visit: Payer: Medicare Other | Admitting: Neurology

## 2018-06-02 ENCOUNTER — Encounter (HOSPITAL_COMMUNITY): Payer: Medicare Other

## 2018-06-02 ENCOUNTER — Encounter (HOSPITAL_COMMUNITY): Admission: RE | Admit: 2018-06-02 | Payer: Medicare Other | Source: Ambulatory Visit

## 2018-06-14 DIAGNOSIS — Z1212 Encounter for screening for malignant neoplasm of rectum: Secondary | ICD-10-CM | POA: Diagnosis not present

## 2018-07-05 DIAGNOSIS — E538 Deficiency of other specified B group vitamins: Secondary | ICD-10-CM | POA: Diagnosis not present

## 2018-07-05 DIAGNOSIS — F0391 Unspecified dementia with behavioral disturbance: Secondary | ICD-10-CM | POA: Diagnosis not present

## 2018-07-05 DIAGNOSIS — E038 Other specified hypothyroidism: Secondary | ICD-10-CM | POA: Diagnosis not present

## 2018-07-05 DIAGNOSIS — Z6833 Body mass index (BMI) 33.0-33.9, adult: Secondary | ICD-10-CM | POA: Diagnosis not present

## 2018-07-05 DIAGNOSIS — I10 Essential (primary) hypertension: Secondary | ICD-10-CM | POA: Diagnosis not present

## 2018-08-15 ENCOUNTER — Other Ambulatory Visit (HOSPITAL_COMMUNITY): Payer: Self-pay | Admitting: Family Medicine

## 2018-08-15 ENCOUNTER — Other Ambulatory Visit: Payer: Self-pay | Admitting: Neurology

## 2018-08-15 DIAGNOSIS — G20 Parkinson's disease: Secondary | ICD-10-CM

## 2018-08-15 DIAGNOSIS — E538 Deficiency of other specified B group vitamins: Secondary | ICD-10-CM

## 2018-08-15 DIAGNOSIS — R443 Hallucinations, unspecified: Secondary | ICD-10-CM

## 2018-08-15 DIAGNOSIS — F0391 Unspecified dementia with behavioral disturbance: Secondary | ICD-10-CM

## 2018-08-25 ENCOUNTER — Ambulatory Visit (HOSPITAL_COMMUNITY)
Admission: RE | Admit: 2018-08-25 | Discharge: 2018-08-25 | Disposition: A | Discharge status: Home / Self Care | Payer: Medicare Other | Source: Ambulatory Visit | Attending: Neurology | Admitting: Neurology

## 2018-08-25 DIAGNOSIS — R413 Other amnesia: Secondary | ICD-10-CM | POA: Diagnosis not present

## 2018-08-25 DIAGNOSIS — R443 Hallucinations, unspecified: Secondary | ICD-10-CM | POA: Insufficient documentation

## 2018-08-25 DIAGNOSIS — G20 Parkinson's disease: Secondary | ICD-10-CM | POA: Insufficient documentation

## 2018-08-25 DIAGNOSIS — F0391 Unspecified dementia with behavioral disturbance: Secondary | ICD-10-CM | POA: Diagnosis not present

## 2018-08-25 DIAGNOSIS — E538 Deficiency of other specified B group vitamins: Secondary | ICD-10-CM | POA: Diagnosis not present

## 2018-08-25 MED ORDER — IODINE STRONG (LUGOLS) 5 % PO SOLN
0.8000 mL | Freq: Once | ORAL | Status: DC
  Filled 2018-08-25: qty 0.8

## 2018-08-25 MED ORDER — IOFLUPANE I 123 185 MBQ/2.5ML IV SOLN
4.6000 | Freq: Once | INTRAVENOUS | Status: AC
  Administered 2018-08-25: 4.6 via INTRAVENOUS

## 2018-08-31 ENCOUNTER — Telehealth: Payer: Self-pay | Admitting: *Deleted

## 2018-08-31 NOTE — Telephone Encounter (Signed)
-----   Message from Melvenia Beam, MD sent at 08/29/2018 12:08 PM EST ----- DAT scan of the brain is negative. This scan is 80% sensitivie for dementia si it may miss some. I am going to recommend referral to [facility] for this patient with very unusual form of dementia. If they agree please let me know there is a dementia specialist at [facility] who is exellent thank you.

## 2018-08-31 NOTE — Telephone Encounter (Addendum)
Called and spoke with pt's husband Rachael Carpenter (on [place]) and discussed results of the datscan from Dr. Jaynee Eagles and her recommendation. The husband spoke with pt and they agreed to the referral at [place]. He is aware that referral will be placed and processed and then their office will call pt to schedule. He verbalized appreciation.

## 2018-09-02 ENCOUNTER — Other Ambulatory Visit: Payer: Self-pay | Admitting: Neurology

## 2018-09-02 DIAGNOSIS — F0391 Unspecified dementia with behavioral disturbance: Secondary | ICD-10-CM

## 2018-09-02 NOTE — Telephone Encounter (Signed)
Done, referal to [facility] for the person below. Did they get formal neurocog testing?  Rachael Carpenter. Rondel Oh, MD, MPH Assistant Professor of Neurology Cognitive and Behavioral Neurology [email or web address]

## 2018-09-14 NOTE — Telephone Encounter (Signed)
I called Dr. Jovita Gamma office at [phone] and asked for their fax number. Referral has been sent to [phone] via fax. I called to make the patient aware and give her their phone number. The patient stated that she did not understand why she needs this and isnt sure why she needs the referral. I offered for her to speak to the nurse but she declined.

## 2018-12-09 DIAGNOSIS — E038 Other specified hypothyroidism: Secondary | ICD-10-CM | POA: Diagnosis not present

## 2018-12-09 DIAGNOSIS — E538 Deficiency of other specified B group vitamins: Secondary | ICD-10-CM | POA: Diagnosis not present

## 2018-12-09 DIAGNOSIS — E039 Hypothyroidism, unspecified: Secondary | ICD-10-CM | POA: Diagnosis not present

## 2018-12-09 DIAGNOSIS — I10 Essential (primary) hypertension: Secondary | ICD-10-CM | POA: Diagnosis not present

## 2018-12-09 DIAGNOSIS — E785 Hyperlipidemia, unspecified: Secondary | ICD-10-CM | POA: Diagnosis not present

## 2018-12-09 DIAGNOSIS — E7849 Other hyperlipidemia: Secondary | ICD-10-CM | POA: Diagnosis not present

## 2018-12-13 DIAGNOSIS — I10 Essential (primary) hypertension: Secondary | ICD-10-CM | POA: Diagnosis not present

## 2018-12-13 DIAGNOSIS — R82998 Other abnormal findings in urine: Secondary | ICD-10-CM | POA: Diagnosis not present

## 2018-12-16 DIAGNOSIS — R2681 Unsteadiness on feet: Secondary | ICD-10-CM | POA: Diagnosis not present

## 2018-12-16 DIAGNOSIS — I10 Essential (primary) hypertension: Secondary | ICD-10-CM | POA: Diagnosis not present

## 2018-12-16 DIAGNOSIS — M5416 Radiculopathy, lumbar region: Secondary | ICD-10-CM | POA: Diagnosis not present

## 2018-12-16 DIAGNOSIS — E538 Deficiency of other specified B group vitamins: Secondary | ICD-10-CM | POA: Diagnosis not present

## 2018-12-16 DIAGNOSIS — E785 Hyperlipidemia, unspecified: Secondary | ICD-10-CM | POA: Diagnosis not present

## 2018-12-16 DIAGNOSIS — F0391 Unspecified dementia with behavioral disturbance: Secondary | ICD-10-CM | POA: Diagnosis not present

## 2018-12-16 DIAGNOSIS — Z0000 Encounter for general adult medical examination without abnormal findings: Secondary | ICD-10-CM | POA: Diagnosis not present

## 2018-12-16 DIAGNOSIS — N179 Acute kidney failure, unspecified: Secondary | ICD-10-CM | POA: Diagnosis not present

## 2018-12-16 DIAGNOSIS — E039 Hypothyroidism, unspecified: Secondary | ICD-10-CM | POA: Diagnosis not present

## 2018-12-20 ENCOUNTER — Telehealth: Payer: Self-pay | Admitting: Neurology

## 2018-12-20 NOTE — Telephone Encounter (Signed)
Pt's daughter Lattie Haw on [place] is calling stating that pt is getting much worse with her Brock Ra and is seeing things and people that are not there. She will not let anyone check on her medications so the family has to assume that she is taking them. Also the pt is keeping her husband up all night because she spends the night up and down the house seeing things and accusing her husband of cheating and other things. The daughter also states that the pt's brother just recently died and the family is wondering if that had anything to do with making the situation worse. Please advise.

## 2018-12-21 NOTE — Telephone Encounter (Signed)
Spoke with Dr. Jaynee Eagles. Recommend to have family make sure there's no medical reason for the worsening. She recommends pt be admitted to a 24 hr care facility which has been recommended in the past.

## 2018-12-21 NOTE — Telephone Encounter (Signed)
Spoke with pt's daughter Lattie Haw and discussed Dr. Cathren Laine recommendations to call primary care and rule out any medical causes for the changes such as UTI, etc. Also discussed recommendation that pt needs 24 hour care at a facility. Lattie Haw verbalized understanding and will talk to Rush Landmark, pt's husband. She stated that she spoke with the pt earlier today and the conversation was pleasant but then the patient cried. Lattie Haw was encouraged to please call back with any updates. She verbalized appreciation for the call.

## 2018-12-23 DIAGNOSIS — F0391 Unspecified dementia with behavioral disturbance: Secondary | ICD-10-CM | POA: Diagnosis not present

## 2018-12-23 DIAGNOSIS — E039 Hypothyroidism, unspecified: Secondary | ICD-10-CM | POA: Diagnosis not present

## 2018-12-23 DIAGNOSIS — E785 Hyperlipidemia, unspecified: Secondary | ICD-10-CM | POA: Diagnosis not present

## 2018-12-23 DIAGNOSIS — R443 Hallucinations, unspecified: Secondary | ICD-10-CM | POA: Diagnosis not present

## 2018-12-30 DIAGNOSIS — R2681 Unsteadiness on feet: Secondary | ICD-10-CM | POA: Diagnosis not present

## 2018-12-30 DIAGNOSIS — R443 Hallucinations, unspecified: Secondary | ICD-10-CM | POA: Diagnosis not present

## 2019-01-13 DIAGNOSIS — E039 Hypothyroidism, unspecified: Secondary | ICD-10-CM | POA: Diagnosis not present

## 2019-01-13 DIAGNOSIS — F0391 Unspecified dementia with behavioral disturbance: Secondary | ICD-10-CM | POA: Diagnosis not present

## 2019-02-06 DIAGNOSIS — R443 Hallucinations, unspecified: Secondary | ICD-10-CM | POA: Diagnosis not present

## 2019-02-06 DIAGNOSIS — E538 Deficiency of other specified B group vitamins: Secondary | ICD-10-CM | POA: Diagnosis not present

## 2019-02-06 DIAGNOSIS — E039 Hypothyroidism, unspecified: Secondary | ICD-10-CM | POA: Diagnosis not present

## 2019-02-06 DIAGNOSIS — Z79899 Other long term (current) drug therapy: Secondary | ICD-10-CM | POA: Diagnosis not present

## 2019-02-08 ENCOUNTER — Encounter (HOSPITAL_COMMUNITY): Payer: Self-pay | Admitting: *Deleted

## 2019-02-08 ENCOUNTER — Emergency Department (HOSPITAL_COMMUNITY)
Admission: EM | Admit: 2019-02-08 | Discharge: 2019-02-09 | Disposition: A | Discharge status: Home / Self Care | Payer: Medicare Other | Attending: Emergency Medicine | Admitting: Emergency Medicine

## 2019-02-08 ENCOUNTER — Other Ambulatory Visit: Payer: Self-pay

## 2019-02-08 DIAGNOSIS — I10 Essential (primary) hypertension: Secondary | ICD-10-CM | POA: Diagnosis not present

## 2019-02-08 DIAGNOSIS — R443 Hallucinations, unspecified: Secondary | ICD-10-CM | POA: Diagnosis not present

## 2019-02-08 DIAGNOSIS — R52 Pain, unspecified: Secondary | ICD-10-CM | POA: Diagnosis not present

## 2019-02-08 DIAGNOSIS — F22 Delusional disorders: Secondary | ICD-10-CM | POA: Diagnosis not present

## 2019-02-08 DIAGNOSIS — R1084 Generalized abdominal pain: Secondary | ICD-10-CM | POA: Diagnosis not present

## 2019-02-08 DIAGNOSIS — E039 Hypothyroidism, unspecified: Secondary | ICD-10-CM | POA: Insufficient documentation

## 2019-02-08 DIAGNOSIS — Z79899 Other long term (current) drug therapy: Secondary | ICD-10-CM | POA: Insufficient documentation

## 2019-02-08 DIAGNOSIS — R4182 Altered mental status, unspecified: Secondary | ICD-10-CM | POA: Diagnosis present

## 2019-02-08 DIAGNOSIS — F0390 Unspecified dementia without behavioral disturbance: Secondary | ICD-10-CM | POA: Insufficient documentation

## 2019-02-08 DIAGNOSIS — F29 Unspecified psychosis not due to a substance or known physiological condition: Secondary | ICD-10-CM | POA: Diagnosis not present

## 2019-02-08 NOTE — ED Triage Notes (Signed)
The pt arrived by ptar from home.  Pt has been altered according to the huisband that she lives with  Live bugs on the pts body

## 2019-02-09 ENCOUNTER — Other Ambulatory Visit: Payer: Self-pay

## 2019-02-09 LAB — CBC WITH DIFFERENTIAL/PLATELET
Abs Immature Granulocytes: 0.01 10*3/uL (ref 0.00–0.07)
Basophils Absolute: 0 10*3/uL (ref 0.0–0.1)
Basophils Relative: 1 %
Eosinophils Absolute: 0.2 10*3/uL (ref 0.0–0.5)
Eosinophils Relative: 3 %
HCT: 39.2 % (ref 36.0–46.0)
Hemoglobin: 12.9 g/dL (ref 12.0–15.0)
Immature Granulocytes: 0 %
Lymphocytes Relative: 45 %
Lymphs Abs: 2.7 10*3/uL (ref 0.7–4.0)
MCH: 29.6 pg (ref 26.0–34.0)
MCHC: 32.9 g/dL (ref 30.0–36.0)
MCV: 89.9 fL (ref 80.0–100.0)
Monocytes Absolute: 0.5 10*3/uL (ref 0.1–1.0)
Monocytes Relative: 9 %
Neutro Abs: 2.5 10*3/uL (ref 1.7–7.7)
Neutrophils Relative %: 42 %
Platelets: 179 10*3/uL (ref 150–400)
RBC: 4.36 MIL/uL (ref 3.87–5.11)
RDW: 12.2 % (ref 11.5–15.5)
WBC: 5.9 10*3/uL (ref 4.0–10.5)
nRBC: 0 % (ref 0.0–0.2)

## 2019-02-09 LAB — BASIC METABOLIC PANEL
Anion gap: 10 (ref 5–15)
BUN: 9 mg/dL (ref 8–23)
CO2: 23 mmol/L (ref 22–32)
Calcium: 8.8 mg/dL — ABNORMAL LOW (ref 8.9–10.3)
Chloride: 105 mmol/L (ref 98–111)
Creatinine, Ser: 0.93 mg/dL (ref 0.44–1.00)
GFR calc Af Amer: >60 mL/min (ref >60)
GFR calc non Af Amer: 57 mL/min — ABNORMAL LOW (ref >60)
Glucose, Bld: 94 mg/dL (ref 70–99)
Potassium: 3.6 mmol/L (ref 3.5–5.1)
Sodium: 138 mmol/L (ref 135–145)

## 2019-02-09 LAB — URINALYSIS, ROUTINE W REFLEX MICROSCOPIC
Bacteria, UA: NONE SEEN
Bilirubin Urine: NEGATIVE
Glucose, UA: NEGATIVE mg/dL
Ketones, ur: NEGATIVE mg/dL
Nitrite: NEGATIVE
Protein, ur: NEGATIVE mg/dL
Specific Gravity, Urine: 1.003 — ABNORMAL LOW (ref 1.005–1.030)
pH: 6 (ref 5.0–8.0)

## 2019-02-09 LAB — HEPATIC FUNCTION PANEL
ALT: 16 U/L (ref 0–44)
AST: 19 U/L (ref 15–41)
Albumin: 3.7 g/dL (ref 3.5–5.0)
Alkaline Phosphatase: 85 U/L (ref 38–126)
Bilirubin, Direct: 0.2 mg/dL (ref 0.0–0.2)
Indirect Bilirubin: 0.3 mg/dL (ref 0.3–0.9)
Total Bilirubin: 0.5 mg/dL (ref 0.3–1.2)
Total Protein: 6.4 g/dL — ABNORMAL LOW (ref 6.5–8.1)

## 2019-02-09 LAB — TSH: TSH: 0.033 u[IU]/mL — ABNORMAL LOW (ref 0.350–4.500)

## 2019-02-09 LAB — T4, FREE: Free T4: 1.7 ng/dL — ABNORMAL HIGH (ref 0.61–1.12)

## 2019-02-09 NOTE — Progress Notes (Signed)
  Medicare.gov - the Conservation officer, historic buildings for [place] health agencies that serve [phone]. [place] Quality of Patient Care Rating Patient Survey Summary Rating [facility] [phone] 3 out of 5 stars 5 out of [place] [phone] 3 out of 5 stars 4 out of [place] [phone] 4  out of 5 stars 3 out of [place] [phone] 4 out of 5 stars 4 out of [place] [phone] 4 out of 5 stars 4 out of 5 stars [facility] [phone] 3  out of 5 stars 4 out of [place] [phone] 3 out of 5 stars 4 out of [place] [phone] 3 out of 5 stars 4 out of [place] [phone] 4  out of 5 stars 3 out of [place] number Footnote as displayed on [place] 1 This agency provides services under a federal waiver program to non-traditional, chronic long term population. 2 This agency provides services to a special needs population. 3 Not Available. 4 The number of patient episodes for this measure is too small to report. 5 This measure currently does not have data or provider has been certified/recertified for less than 6 months. 6 The national average for this measure is not provided because of state-to-state differences in data collection. 7 Medicare is not displaying rates for this measure for any home health agency, because of an issue with the data. 8 There were problems with the data and they are being corrected. 9 Zero, or very few, patients met the survey's rules for inclusion. The scores shown, if any, reflect a very small number of surveys and may not accurately tell how an agency is  doing. 10 Survey results are based on less than 12 months of data. 11 Fewer than 70 patients completed the survey. Use the scores shown, if any, with caution as the number of surveys may be too low to accurately tell how an agency is doing. 12 No survey results are available for this period. 13 Data suppressed by CMS for one or more quarters.

## 2019-02-09 NOTE — ED Notes (Signed)
Pt is able to ambulate unassisted; urine specimen provided.

## 2019-02-09 NOTE — ED Provider Notes (Signed)
[facility] EMERGENCY DEPARTMENT Provider Note  CSN: [id number] Arrival date & time: 02/08/19 2315  Chief Complaint(s) Altered Mental Status  HPI Rachael Carpenter is a 84 y.o. female with extensive past medical history listed below including recent frontal lobe CVA, history of dementia and hallucinations who presents to the emergency department with worsening dementia and hallucinations.  Husband reports her hallucinations are typically intermittent.  They have discussed placement in a facility but they chose to keep the patient at home.  He reports that over the last week the hallucinations have been more persistent and gradually worsening.  He reports that she hallucinates seeing people who are not there and having conversations throughout the day.  Patient's husband states that he is there 24 hours a day caring for her.  He gets some help from the daughter as needed.  He denies any no recent fevers or infections.  No falls or trauma.  Remainder of history, ROS, and physical exam limited due to patient's condition (dementia). Additional information was obtained from husband.   Level V Caveat.    HPI   Past Medical History Past Medical History:  Diagnosis Date  . Acute kidney failure ([place]) 02/2018  . Cerebral infarction, unspecified ([place]) 02/2018  . Colon polyp    in cecum on July 2010 Colonoscopy  . Dementia ([place])   . Hallucinations   . Hyperlipidemia   . Hypertension   . Hypothyroidism   . Intestinal malrotation    on CT scan 2010 in [place]  . Metabolic encephalopathy   . Neck pain    L; pt denies as of 02/24/18  . Suicidal ideations   . Thyroid disease   . Vitamin B12 deficiency anemia    Patient Active Problem List   Diagnosis Date Noted  . Dementia with behavioral disturbance ([place]) 04/28/2018  . Encephalopathy 03/05/2018  . Hypothyroidism 03/05/2018  . AKI (acute kidney injury) ([place]) 03/05/2018  . Suicidal ideation 03/05/2018  . B12 deficiency  03/05/2018  . Toxic encephalopathy 03/05/2018  . Psychosis, paranoid ([place]) 03/05/2018  . Hallucinations 02/28/2018  . HYPERLIPIDEMIA 07/11/2010  . Essential hypertension 07/11/2010  . DYSPNEA 07/11/2010  . COUGH 07/11/2010   Home Medication(s) Prior to Admission medications   Medication Sig Start Date End Date Taking? Authorizing Provider  amLODipine (NORVASC) 2.5 MG tablet Take 2.5 mg by mouth daily.    [provider]  atorvastatin (LIPITOR) 80 MG tablet Take 1 tablet (80 mg total) by mouth daily. 03/14/18   Patrecia Pour, MD  clopidogrel (PLAVIX) 75 MG tablet Take 1 tablet (75 mg total) by mouth daily. 03/15/18   Patrecia Pour, MD  Cyanocobalamin (B-12 PO) Take 500 mcg by mouth daily.    [provider]  divalproex (DEPAKOTE) 125 MG DR tablet Take 125 mg by mouth 2 (two) times daily.    [provider]  donepezil (ARICEPT) 10 MG tablet Take 1 tablet (10 mg total) by mouth at bedtime. 02/24/18   Melvenia Beam, MD  levothyroxine (SYNTHROID, LEVOTHROID) 200 MCG tablet Take 200 mcg by mouth daily. **BRAND NAME ONLY** takes with 62mcg 02/21/18   [provider]  levothyroxine (SYNTHROID, LEVOTHROID) 50 MCG tablet Take 50 mcg by mouth daily before breakfast. **BRAND NAME ONLY** takes with 271mcg    [provider]  QUEtiapine (SEROQUEL) 100 MG tablet Take 100 mg by mouth. Every afternoon    [provider]  Past Surgical History Past Surgical History:  Procedure Laterality Date  . 4th finger flexor sheath ganglion Left 02/2006  . CHOLECYSTECTOMY  1990s  . [place]  . RIGHT FOOT MOLE EXCISION Right 2011  . scalp lesion removal  2019  . SHOULDER RECONSTRUCTION Right 07/2001  . SHOULDER SURGERY Right   . TOTAL ABDOMINAL HYSTERECTOMY     84 y.o.   Family History Family History  Problem  Relation Age of Onset  . COPD Sister   . Cancer Daughter   . Cancer Brother   . Cancer Brother   . Dementia Neg Hx     Social History Social History   Tobacco Use  . Smoking status: Never Smoker  . Smokeless tobacco: Never Used  Substance Use Topics  . Alcohol use: Never    Frequency: Never  . Drug use: Never   Allergies Penicillins  Review of Systems Review of Systems  Unable to perform ROS: Dementia    Physical Exam Vital Signs  I have reviewed the triage vital signs BP (!) 181/78   Pulse 88   Temp 98.1 F (36.7 C)   Resp 18   Ht 5\' 5"  (1.651 m)   Wt 85.3 kg   SpO2 98%   BMI 31.28 kg/m  [place] Physical Exam Vitals signs reviewed.  Constitutional:      General: She is not in acute distress.    Appearance: She is well-developed. She is not diaphoretic.  HENT:     Head: Normocephalic and atraumatic.     Nose: Nose normal.  Eyes:     General: No scleral icterus.       Right eye: No discharge.        Left eye: No discharge.     Conjunctiva/sclera: Conjunctivae normal.     Pupils: Pupils are equal, round, and reactive to light.  Neck:     Musculoskeletal: Normal range of motion and neck supple.  Cardiovascular:     Rate and Rhythm: Normal rate and regular rhythm.     Heart sounds: No murmur. No friction rub. No gallop.   Pulmonary:     Effort: Pulmonary effort is normal. No respiratory distress.     Breath sounds: Normal breath sounds. No stridor. No rales.  Abdominal:     General: There is no distension.     Palpations: Abdomen is soft.     Tenderness: There is no abdominal tenderness.  Musculoskeletal:        General: No tenderness.  Skin:    General: Skin is warm and dry.     Findings: No erythema or rash.  Neurological:     Mental Status: She is alert.     Comments: Patient is oriented to person and place but not time.    Psychiatric:        Thought Content: Thought content is delusional.     ED Results and Treatments Labs (all labs  ordered are listed, but only abnormal results are displayed) Labs Reviewed  BASIC METABOLIC PANEL - Abnormal; Notable for the following components:      Result Value   Calcium 8.8 (*)    GFR calc non Af Amer 57 (*)    All other components within normal limits  URINALYSIS, ROUTINE W REFLEX MICROSCOPIC - Abnormal; Notable for the following components:   Color, Urine STRAW (*)    Specific Gravity, Urine 1.003 (*)    Hgb urine dipstick SMALL (*)    Leukocytes,Ua SMALL (*)  All other components within normal limits  HEPATIC FUNCTION PANEL - Abnormal; Notable for the following components:   Total Protein 6.4 (*)    All other components within normal limits  T4, FREE - Abnormal; Notable for the following components:   Free T4 1.70 (*)    All other components within normal limits  TSH - Abnormal; Notable for the following components:   TSH 0.033 (*)    All other components within normal limits  CBC WITH DIFFERENTIAL/PLATELET                                                                                                                         EKG  EKG Interpretation  Date/Time:    Ventricular Rate:    PR Interval:    QRS Duration:   QT Interval:    QTC Calculation:   R Axis:     Text Interpretation:        Radiology No results found.  Pertinent labs & imaging results that were available during my care of the patient were reviewed by me and considered in my medical decision making (see chart for details).  Medications Ordered in ED Medications - No data to display                                                                                                                                  Procedures Procedures  (including critical care time)  Medical Decision Making / ED Course I have reviewed the nursing notes for this encounter and the patient's prior records (if available in EHR or on provided paperwork).   PALESTINE MOSCO was evaluated in Emergency Department on  02/09/2019 for the symptoms described in the history of present illness. She was evaluated in the context of the global COVID-19 pandemic, which necessitated consideration that the patient might be at risk for infection with the SARS-CoV-2 virus that causes COVID-19. Institutional protocols and algorithms that pertain to the evaluation of patients at risk for COVID-19 are in a state of rapid change based on information released by regulatory bodies including the CDC and federal and state organizations. These policies and algorithms were followed during the patient's care in the ED.  On review of records, patient was thought to have Hashimoto's thyroiditis encephalitis and treated with IV steroids back in May however these were discontinued as it seemed to be making the symptoms worse.  At  time of admission patient also had an acute frontal lobe stroke, which may also be contributing to her symptoms.  There is no reported trauma.  No infectious symptoms.  No focal deficits on exam.  Labs reassuring without leukocytosis or anemia.  UA without evidence of infection.  No acute organic etiology. Discussed with husband possible home health, which he was ameable to.  Case management consulted.      Final Clinical Impression(s) / ED Diagnoses Final diagnoses:  [facility])      This chart was dictated using voice recognition software.  Despite best efforts to proofread,  errors can occur which can change the documentation meaning.   Fatima Blank, MD 02/09/19 [phone]

## 2019-02-09 NOTE — ED Notes (Signed)
Pt given breakfast tray

## 2019-02-09 NOTE — ED Notes (Addendum)
Case management contacted to get an ETA for consult; Edwin Cap of case management directed this RN to contact social work; Per Arbie Cookey at social work, will come see pt in approx 30 minutes

## 2019-02-09 NOTE — Progress Notes (Signed)
CSW received call from [place], [place] regarding assistance obtaining home health for this patient. CSW spoke with Edwin Cap, RN CM to request her assistance in getting [facility] set up for this patient. RN CM agreeable and will coordinate [facility] services and will contact patient's husband. CSW updated Hannie, RN of plan to discharge patient with [facility] services.  Madilyn Fireman, MSW, LCSW-A Clinical Social Worker [facility] [place] [facility] [phone]

## 2019-02-09 NOTE — ED Notes (Signed)
Pt's husband to be contacted in a.m. to pick up pt. Case mgmt consult will be placed by provider. Pt updated and states "I will just go to sleep, then".

## 2019-02-09 NOTE — TOC Initial Note (Signed)
Transition of Care [facility]) - Initial/Assessment Note    Patient Details  Name: Rachael Carpenter MRN: [id number] Date of Birth: 05-Sep-1934  Transition of Care [facility]) CM/SW Contact:    Erenest Rasher, RN Phone Number: [phone] 02/09/2019, 1:57 PM  Clinical Narrative:                 TOC CM spoke to ED RN and best to speak with husband. Contacted husband via phone. States she gets around the home without any problems. He needs assistance with her meds. Offered choice and husband agreeable to [facility]. Contacted [facility] with new referral.     Expected Discharge Plan: [place] Barriers to Discharge: No Barriers Identified   Patient Goals and CMS Choice Patient states their goals for this hospitalization and ongoing recovery are:: want to keep her meds in order CMS Medicare.gov Compare Post Acute Care list provided to:: Patient Represenative (must comment) Choice offered to / list presented to : Spouse  Expected Discharge Plan and Services Expected Discharge Plan: [place]   Discharge Planning Services: CM Consult Post Acute Care Choice: [place] arrangements for the past 2 months: Single Family Home                           HH Arranged: RN, Nurse's Aide, Social Work [employer] Agency: [facility] [place] Date [place]: 02/09/19 Time [place]: 1355 Representative spoke with at [place]: Oswald Hillock  Prior Living Arrangements/Services Living arrangements for the past 2 months: [place] with:: Spouse Patient language and need for interpreter reviewed:: Yes Do you feel safe going back to the place where you live?: Yes      Need for Family Participation in Patient Care: Yes (Comment) Care giver support system in place?: Yes (comment)   Criminal Activity/Legal Involvement Pertinent to Current Situation/Hospitalization: No - Comment as needed  Activities of Daily Living       Permission Sought/Granted Permission sought to share information with : Case Manager Permission granted to share information with : Yes, Verbal Permission Granted  Share Information with NAME: Johany Hansman  Permission granted to share info w AGENCY: [facility]  Permission granted to share info w Relationship: husband  Permission granted to share info w Contact Information: [phone]  Emotional Assessment       Orientation: : Oriented to Self   Psych Involvement: No (comment)  Admission diagnosis:  AMS Patient Active Problem List   Diagnosis Date Noted  . Dementia with behavioral disturbance ([employer]) 04/28/2018  . Encephalopathy 03/05/2018  . Hypothyroidism 03/05/2018  . AKI (acute kidney injury) ([place]) 03/05/2018  . Suicidal ideation 03/05/2018  . B12 deficiency 03/05/2018  . Toxic encephalopathy 03/05/2018  . Psychosis, paranoid ([place]) 03/05/2018  . Hallucinations 02/28/2018  . HYPERLIPIDEMIA 07/11/2010  . Essential hypertension 07/11/2010  . DYSPNEA 07/11/2010  . COUGH 07/11/2010   PCP:  [name], MD Pharmacy:   [id number] - [place], [address] [place] [address] Phone: [phone] Fax: [phone]     Social Determinants of Health (SDOH) Interventions    Readmission Risk Interventions No flowsheet data found.

## 2019-02-09 NOTE — ED Notes (Addendum)
Per Edwin Cap of social work, SW will contact and set up home health services and will contact pt's Husband once home health is arranged; pt okay to be discharged home at this time

## 2019-02-10 DIAGNOSIS — D519 Vitamin B12 deficiency anemia, unspecified: Secondary | ICD-10-CM | POA: Diagnosis not present

## 2019-02-10 DIAGNOSIS — R440 Auditory hallucinations: Secondary | ICD-10-CM | POA: Diagnosis not present

## 2019-02-10 DIAGNOSIS — Z8673 Personal history of transient ischemic attack (TIA), and cerebral infarction without residual deficits: Secondary | ICD-10-CM | POA: Diagnosis not present

## 2019-02-10 DIAGNOSIS — F0391 Unspecified dementia with behavioral disturbance: Secondary | ICD-10-CM | POA: Diagnosis not present

## 2019-02-10 DIAGNOSIS — R441 Visual hallucinations: Secondary | ICD-10-CM | POA: Diagnosis not present

## 2019-02-10 DIAGNOSIS — I10 Essential (primary) hypertension: Secondary | ICD-10-CM | POA: Diagnosis not present

## 2019-02-13 DIAGNOSIS — R440 Auditory hallucinations: Secondary | ICD-10-CM | POA: Diagnosis not present

## 2019-02-13 DIAGNOSIS — R441 Visual hallucinations: Secondary | ICD-10-CM | POA: Diagnosis not present

## 2019-02-13 DIAGNOSIS — D519 Vitamin B12 deficiency anemia, unspecified: Secondary | ICD-10-CM | POA: Diagnosis not present

## 2019-02-13 DIAGNOSIS — I10 Essential (primary) hypertension: Secondary | ICD-10-CM | POA: Diagnosis not present

## 2019-02-13 DIAGNOSIS — F0391 Unspecified dementia with behavioral disturbance: Secondary | ICD-10-CM | POA: Diagnosis not present

## 2019-02-13 DIAGNOSIS — Z8673 Personal history of transient ischemic attack (TIA), and cerebral infarction without residual deficits: Secondary | ICD-10-CM | POA: Diagnosis not present

## 2019-02-15 DIAGNOSIS — E038 Other specified hypothyroidism: Secondary | ICD-10-CM | POA: Diagnosis not present

## 2019-02-17 ENCOUNTER — Emergency Department (HOSPITAL_COMMUNITY)
Admission: EM | Admit: 2019-02-17 | Discharge: 2019-02-19 | Disposition: A | Discharge status: Other Acute Inpatient Hospital | Payer: Medicare Other | Attending: Emergency Medicine | Admitting: Emergency Medicine

## 2019-02-17 ENCOUNTER — Encounter (HOSPITAL_COMMUNITY): Payer: Self-pay

## 2019-02-17 ENCOUNTER — Emergency Department (HOSPITAL_COMMUNITY): Payer: Medicare Other

## 2019-02-17 DIAGNOSIS — F0391 Unspecified dementia with behavioral disturbance: Secondary | ICD-10-CM | POA: Diagnosis present

## 2019-02-17 DIAGNOSIS — Z008 Encounter for other general examination: Secondary | ICD-10-CM

## 2019-02-17 DIAGNOSIS — F03918 Unspecified dementia, unspecified severity, with other behavioral disturbance: Secondary | ICD-10-CM | POA: Diagnosis present

## 2019-02-17 DIAGNOSIS — Z20828 Contact with and (suspected) exposure to other viral communicable diseases: Secondary | ICD-10-CM | POA: Diagnosis not present

## 2019-02-17 DIAGNOSIS — I10 Essential (primary) hypertension: Secondary | ICD-10-CM | POA: Insufficient documentation

## 2019-02-17 DIAGNOSIS — F0390 Unspecified dementia without behavioral disturbance: Secondary | ICD-10-CM | POA: Diagnosis not present

## 2019-02-17 DIAGNOSIS — R4020 Unspecified coma: Secondary | ICD-10-CM | POA: Diagnosis not present

## 2019-02-17 DIAGNOSIS — R443 Hallucinations, unspecified: Secondary | ICD-10-CM | POA: Diagnosis not present

## 2019-02-17 DIAGNOSIS — Z03818 Encounter for observation for suspected exposure to other biological agents ruled out: Secondary | ICD-10-CM | POA: Diagnosis not present

## 2019-02-17 DIAGNOSIS — F22 Delusional disorders: Secondary | ICD-10-CM

## 2019-02-17 DIAGNOSIS — E039 Hypothyroidism, unspecified: Secondary | ICD-10-CM | POA: Diagnosis not present

## 2019-02-17 DIAGNOSIS — Z7982 Long term (current) use of aspirin: Secondary | ICD-10-CM | POA: Insufficient documentation

## 2019-02-17 DIAGNOSIS — F0281 Dementia in other diseases classified elsewhere with behavioral disturbance: Secondary | ICD-10-CM | POA: Diagnosis not present

## 2019-02-17 DIAGNOSIS — G308 Other Alzheimer's disease: Secondary | ICD-10-CM | POA: Diagnosis not present

## 2019-02-17 DIAGNOSIS — F02818 Dementia in other diseases classified elsewhere, unspecified severity, with other behavioral disturbance: Secondary | ICD-10-CM

## 2019-02-17 DIAGNOSIS — Z79899 Other long term (current) drug therapy: Secondary | ICD-10-CM | POA: Insufficient documentation

## 2019-02-17 DIAGNOSIS — R410 Disorientation, unspecified: Secondary | ICD-10-CM | POA: Diagnosis not present

## 2019-02-17 LAB — CBC WITH DIFFERENTIAL/PLATELET
Abs Immature Granulocytes: 0.02 K/uL (ref 0.00–0.07)
Basophils Absolute: 0 K/uL (ref 0.0–0.1)
Basophils Relative: 1 %
Eosinophils Absolute: 0.2 K/uL (ref 0.0–0.5)
Eosinophils Relative: 2 %
HCT: 40.2 % (ref 36.0–46.0)
Hemoglobin: 12.8 g/dL (ref 12.0–15.0)
Immature Granulocytes: 0 %
Lymphocytes Relative: 37 %
Lymphs Abs: 2.6 K/uL (ref 0.7–4.0)
MCH: 28.8 pg (ref 26.0–34.0)
MCHC: 31.8 g/dL (ref 30.0–36.0)
MCV: 90.3 fL (ref 80.0–100.0)
Monocytes Absolute: 0.7 K/uL (ref 0.1–1.0)
Monocytes Relative: 9 %
Neutro Abs: 3.5 K/uL (ref 1.7–7.7)
Neutrophils Relative %: 51 %
Platelets: 205 K/uL (ref 150–400)
RBC: 4.45 MIL/uL (ref 3.87–5.11)
RDW: 12.2 % (ref 11.5–15.5)
WBC: 6.9 K/uL (ref 4.0–10.5)
nRBC: 0 % (ref 0.0–0.2)

## 2019-02-17 LAB — URINALYSIS, ROUTINE W REFLEX MICROSCOPIC
Bilirubin Urine: NEGATIVE
Glucose, UA: NEGATIVE mg/dL
Ketones, ur: NEGATIVE mg/dL
Nitrite: NEGATIVE
Protein, ur: NEGATIVE mg/dL
Specific Gravity, Urine: 1.005 (ref 1.005–1.030)
pH: 6 (ref 5.0–8.0)

## 2019-02-17 LAB — COMPREHENSIVE METABOLIC PANEL WITH GFR
ALT: 16 U/L (ref 0–44)
AST: 17 U/L (ref 15–41)
Albumin: 4 g/dL (ref 3.5–5.0)
Alkaline Phosphatase: 81 U/L (ref 38–126)
Anion gap: 11 (ref 5–15)
BUN: 18 mg/dL (ref 8–23)
CO2: 27 mmol/L (ref 22–32)
Calcium: 8.6 mg/dL — ABNORMAL LOW (ref 8.9–10.3)
Chloride: 100 mmol/L (ref 98–111)
Creatinine, Ser: 0.91 mg/dL (ref 0.44–1.00)
GFR calc Af Amer: >60 mL/min
GFR calc non Af Amer: 59 mL/min — ABNORMAL LOW
Glucose, Bld: 97 mg/dL (ref 70–99)
Potassium: 3.4 mmol/L — ABNORMAL LOW (ref 3.5–5.1)
Sodium: 138 mmol/L (ref 135–145)
Total Bilirubin: 0.7 mg/dL (ref 0.3–1.2)
Total Protein: 7 g/dL (ref 6.5–8.1)

## 2019-02-17 LAB — VALPROIC ACID LEVEL: Valproic Acid Lvl: 28 ug/mL — ABNORMAL LOW (ref 50.0–100.0)

## 2019-02-17 LAB — TSH: TSH: 0.019 u[IU]/mL — ABNORMAL LOW (ref 0.350–4.500)

## 2019-02-17 LAB — SARS CORONAVIRUS 2 BY RT PCR (HOSPITAL ORDER, PERFORMED IN ~~LOC~~ HOSPITAL LAB): SARS Coronavirus 2: NEGATIVE

## 2019-02-17 LAB — T4, FREE: Free T4: 2.64 ng/dL — ABNORMAL HIGH (ref 0.61–1.12)

## 2019-02-17 MED ORDER — CEPHALEXIN 500 MG PO CAPS
500.0000 mg | ORAL_CAPSULE | Freq: Two times a day (BID) | ORAL | Status: DC
  Administered 2019-02-17 – 2019-02-19 (×5): 500 mg via ORAL
  Filled 2019-02-17 (×5): qty 1

## 2019-02-17 MED ORDER — VITAMIN B-12 1000 MCG PO TABS
500.0000 ug | ORAL_TABLET | Freq: Every day | ORAL | Status: DC
  Administered 2019-02-17 – 2019-02-19 (×3): 500 ug via ORAL
  Filled 2019-02-17 (×3): qty 0.5

## 2019-02-17 MED ORDER — LEVOTHYROXINE SODIUM 50 MCG PO TABS: 50.0000 ug | ORAL_TABLET | Freq: Every day | ORAL | Status: DC

## 2019-02-17 MED ORDER — LEVOTHYROXINE SODIUM 200 MCG PO TABS: 200.0000 ug | ORAL_TABLET | Freq: Every day | ORAL | Status: DC

## 2019-02-17 MED ORDER — ASPIRIN EC 81 MG PO TBEC
81.0000 mg | DELAYED_RELEASE_TABLET | Freq: Every day | ORAL | Status: DC
  Administered 2019-02-17 – 2019-02-19 (×3): 81 mg via ORAL
  Filled 2019-02-17 (×3): qty 1

## 2019-02-17 MED ORDER — DIVALPROEX SODIUM 125 MG PO DR TAB
125.0000 mg | DELAYED_RELEASE_TABLET | Freq: Two times a day (BID) | ORAL | Status: DC
  Administered 2019-02-17 – 2019-02-19 (×5): 125 mg via ORAL
  Filled 2019-02-17 (×7): qty 1

## 2019-02-17 MED ORDER — OMEGA-3-ACID ETHYL ESTERS 1 G PO CAPS
1.0000 g | ORAL_CAPSULE | Freq: Every day | ORAL | Status: DC
  Administered 2019-02-17 – 2019-02-19 (×3): 1 g via ORAL
  Filled 2019-02-17 (×3): qty 1

## 2019-02-17 MED ORDER — LEVOTHYROXINE SODIUM 200 MCG PO TABS
200.0000 ug | ORAL_TABLET | Freq: Every day | ORAL | Status: DC
  Administered 2019-02-17 – 2019-02-19 (×3): 200 ug via ORAL
  Filled 2019-02-17 (×2): qty 1

## 2019-02-17 MED ORDER — DONEPEZIL HCL 5 MG PO TABS
10.0000 mg | ORAL_TABLET | Freq: Every day | ORAL | Status: DC
  Administered 2019-02-17 – 2019-02-18 (×2): 10 mg via ORAL
  Filled 2019-02-17 (×2): qty 2

## 2019-02-17 MED ORDER — LEVOTHYROXINE SODIUM 125 MCG PO TABS
250.0000 ug | ORAL_TABLET | Freq: Every day | ORAL | Status: DC
  Administered 2019-02-17: 250 ug via ORAL
  Filled 2019-02-17: qty 2

## 2019-02-17 MED ORDER — AMLODIPINE BESYLATE 5 MG PO TABS
2.5000 mg | ORAL_TABLET | Freq: Every day | ORAL | Status: DC
  Administered 2019-02-17 – 2019-02-19 (×3): 2.5 mg via ORAL
  Filled 2019-02-17 (×3): qty 1

## 2019-02-17 MED ORDER — QUETIAPINE FUMARATE 100 MG PO TABS
100.0000 mg | ORAL_TABLET | Freq: Every day | ORAL | Status: DC
  Administered 2019-02-17 – 2019-02-18 (×2): 100 mg via ORAL
  Filled 2019-02-17 (×2): qty 1

## 2019-02-17 NOTE — ED Notes (Signed)
Patient is wanting to leave. This Probation officer told patient to wait for TTS to do their consultation.

## 2019-02-17 NOTE — ED Notes (Signed)
Patient stated she's speaking to some people in the room that aren't present. Patient stated Dr.Patterson has released her to go , this Probation officer is unaware of who Dr.Patterson is.  Patient appears to be having auditory hallucinations.
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
## 2019-02-17 NOTE — ED Notes (Signed)
Patient ambulated to bathroom w/ Glennon Mac EMT assistance.

## 2019-02-17 NOTE — ED Notes (Signed)
Pt to room 32. Pt alert, calm, cooperative, confused, no s/s of distress. Sitter at bedside.

## 2019-02-17 NOTE — BH Assessment (Signed)
[place] Assessment Progress Note   Case was staffed with Mariea Clonts DO who recommended a inpatient admission. Mariea Clonts DO will also initiate a IVC.

## 2019-02-17 NOTE — ED Triage Notes (Signed)
Pt is brought in by her family, they say that her dementia is getting worse, she's seeing things and people, her husband hasn't slept in two weeks, she's mean to him and her daughters at times Pt's primary doctor suggested them to bring her to the ED

## 2019-02-17 NOTE — Progress Notes (Signed)
CSW received consult for placement (possibly ALF/memory care). Of note, patient has been evaluated by TTS and a recommendation of inpatient psych admission has been made. No social work needs noted at this time. Please consult again if any social work needs arise.   [place], LCSW Transitions of Care Department [facility] ED [phone]

## 2019-02-17 NOTE — BH Assessment (Addendum)
[facility] Assessment Progress Note  Per Buford Dresser, DO, this pt requires psychiatric hospitalization at this time.  Dr Mariea Clonts also finds that pt meets criteria for IVC, which she has initiated.  IVC documents have been faxed to [facility], and at 13:59 Lattie Corns confirms receipt.  He has since faxed Findings and Custody Order to this Probation officer.  At 14:28I called [employer] and spoke to an Set designer, who took demographic information, agreeing to dispatch law enforcement to fill out Return of Service.  Law enforcement then presented at [facility], completing Return of Service.  The following facilities have been contacted to seek placement for this pt, with results as noted:  Beds available, information sent, decision pending:  Ronnette Juniper, [place] [phone]   Addendum:  At 15:45 this Probation officer handed off to [employer], [place].   Jalene Mullet, [place] Coordinator [phone]

## 2019-02-17 NOTE — ED Notes (Signed)
Sitter is at bedside.  °

## 2019-02-18 DIAGNOSIS — F0391 Unspecified dementia with behavioral disturbance: Secondary | ICD-10-CM | POA: Diagnosis not present

## 2019-02-18 DIAGNOSIS — R443 Hallucinations, unspecified: Secondary | ICD-10-CM

## 2019-02-18 DIAGNOSIS — F0390 Unspecified dementia without behavioral disturbance: Secondary | ICD-10-CM | POA: Diagnosis not present

## 2019-02-18 LAB — URINE CULTURE: Culture: NO GROWTH

## 2019-02-18 LAB — T3, FREE: T3, Free: 4 pg/mL (ref 2.0–4.4)

## 2019-02-18 NOTE — Progress Notes (Signed)
EKG hand-delivered to Dr. Tomi Bamberger.

## 2019-02-18 NOTE — ED Provider Notes (Signed)
Vitals:   02/17/19 2202 02/18/19 0602  BP: (!) 136/55 (!) 141/59  Pulse: 74 70  Resp: 16 14  Temp: 98.6 F (37 C) 97.8 F (36.6 C)  SpO2: 93% 95%   Medically stable.  Awaiting psych placement.   Dorie Rank, MD 02/18/19 [phone]

## 2019-02-18 NOTE — Progress Notes (Signed)
Patient meets criteria for inpatient treatment. No appropriate or available beds at [facility]. CSW faxed referrals to the following facilities for review:  [facility]  [facility]  [facility]  [facility]  [facility]   TTS will continue to seek bed placement.  Chalmers Guest. Guerry Bruin, MSW, [place] Work/Disposition Phone: [phone] Fax: [phone]

## 2019-02-18 NOTE — ED Notes (Signed)
Report on Situation, Background, Assessment, and Recommendations received from [place], [place]. Patient alert and in no visible distress. Patient made aware of Q15 minute rounds and security cameras for their safety. Patient instructed to come to me with needs or concerns.
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
## 2019-02-18 NOTE — Progress Notes (Signed)
Received Rachael Carpenter in her room asleep in her bed with the sitter at the bedside. She was compliant with her medications. She is oriented self and her location. She slept throughout the night without incident.

## 2019-02-18 NOTE — Progress Notes (Signed)
Patient received sandwich and drink as requested.

## 2019-02-19 DIAGNOSIS — T7800XA Anaphylactic reaction due to unspecified food, initial encounter: Secondary | ICD-10-CM | POA: Diagnosis not present

## 2019-02-19 DIAGNOSIS — I10 Essential (primary) hypertension: Secondary | ICD-10-CM | POA: Diagnosis present

## 2019-02-19 DIAGNOSIS — E039 Hypothyroidism, unspecified: Secondary | ICD-10-CM | POA: Diagnosis present

## 2019-02-19 DIAGNOSIS — F29 Unspecified psychosis not due to a substance or known physiological condition: Secondary | ICD-10-CM | POA: Diagnosis present

## 2019-02-19 DIAGNOSIS — F0281 Dementia in other diseases classified elsewhere with behavioral disturbance: Secondary | ICD-10-CM | POA: Diagnosis not present

## 2019-02-19 DIAGNOSIS — F0390 Unspecified dementia without behavioral disturbance: Secondary | ICD-10-CM | POA: Diagnosis not present

## 2019-02-19 DIAGNOSIS — Z20828 Contact with and (suspected) exposure to other viral communicable diseases: Secondary | ICD-10-CM | POA: Diagnosis not present

## 2019-02-19 DIAGNOSIS — R6889 Other general symptoms and signs: Secondary | ICD-10-CM | POA: Diagnosis not present

## 2019-02-19 DIAGNOSIS — Z7982 Long term (current) use of aspirin: Secondary | ICD-10-CM | POA: Diagnosis not present

## 2019-02-19 DIAGNOSIS — Z79899 Other long term (current) drug therapy: Secondary | ICD-10-CM | POA: Diagnosis not present

## 2019-02-19 DIAGNOSIS — E559 Vitamin D deficiency, unspecified: Secondary | ICD-10-CM | POA: Diagnosis present

## 2019-02-19 DIAGNOSIS — Z03818 Encounter for observation for suspected exposure to other biological agents ruled out: Secondary | ICD-10-CM | POA: Diagnosis not present

## 2019-02-19 NOTE — Consult Note (Addendum)
Progress Notes  Reason for Consult: Worsening hallucinations Referring Physician:  Dr. Leonides Schanz Location of Patient: [facility] Location of Provider: [facility]  Patient Identification: Rachael Carpenter MRN:  [id number] Principal Diagnosis: Dementia with behavioral disturbance [facility]) Diagnosis:  Principal Problem:   Dementia with behavioral disturbance ([place])   Total Time spent with patient: 30 minutes  Subjective: " I waiting to go home ."  HPI:  Rachael Carpenter is an 84 year old female with history of hypertension, hyperlipidemia, hypothyroidism, dementia who presents to the emergency department with worsening hallucinations, delusions and agitation. presented to [facility] ED.The patient was seen face-to-face by this provider; chart reviewed and consulted with Dr. Parke Poisson on 02/19/2019 due to the care of the patient. The patient has been accepted to [facility] and is awaiting transportion  On evaluation today the patient is alert and oriented x 2-3, calm and cooperative, and mood-congruent with affect. The patient does not appear to be responding to internal or external stimuli  Past Psychiatric History:  Dementia Hallucinations Suicidal ideations  Risk to Self: Suicidal Ideation: No Suicidal Intent: No Is patient at risk for suicide?: No, but patient needs Medical Clearance Suicidal Plan?: No Access to Means: No What has been your use of drugs/alcohol within the last 12 months?: NA How many times?: 0 Other Self Harm Risks: (NA) Triggers for Past Attempts: (NA) Intentional Self Injurious Behavior: None Risk to Others: Homicidal Ideation: No Thoughts of Harm to Others: No Current Homicidal Intent: No Current Homicidal Plan: No Access to Homicidal Means: No Identified Victim: NA History of harm to others?: No Assessment of Violence: None Noted Violent Behavior Description: NA Does patient have access to weapons?: No Criminal Charges Pending?: No Does  patient have a court date: No Prior Inpatient Therapy: Prior Inpatient Therapy: Yes Prior Therapy Dates: 2019 Prior Therapy Facilty/Provider(s): [place] Reason for Treatment: MH issues Prior Outpatient Therapy: Prior Outpatient Therapy: Yes Prior Therapy Dates: Ongoing Prior Therapy Facilty/Provider(s): [facility]  Reason for Treatment: Med mang  Does patient have an ACCT team?: No Does patient have Intensive In-House Services?  : No Does patient have [facility] services? : No Does patient have P4CC services?: No  Past Medical History:  Past Medical History:  Diagnosis Date  . Acute kidney failure ([place]) 02/2018  . Cerebral infarction, unspecified ([place]) 02/2018  . Colon polyp    in cecum on July 2010 Colonoscopy  . Dementia ([place])   . Hallucinations   . Hyperlipidemia   . Hypertension   . Hypothyroidism   . Intestinal malrotation    on CT scan 2010 in [place]  . Metabolic encephalopathy   . Neck pain    L; pt denies as of 02/24/18  . Suicidal ideations   . Thyroid disease   . Vitamin B12 deficiency anemia     Past Surgical History:  Procedure Laterality Date  . 4th finger flexor sheath ganglion Left 02/2006  . CHOLECYSTECTOMY  1990s  . [place]  . RIGHT FOOT MOLE EXCISION Right 2011  . scalp lesion removal  2019  . SHOULDER RECONSTRUCTION Right 07/2001  . SHOULDER SURGERY Right   . TOTAL ABDOMINAL HYSTERECTOMY     84 y.o.   Family History:  Family History  Problem Relation Age of Onset  . COPD Sister   . Cancer Daughter   . Cancer Brother   . Cancer Brother   . Dementia Neg Hx    Family Psychiatric  History: None per chart  review.  Social History:  Social History   Substance and Sexual Activity  Alcohol Use Never  . Frequency: Never     Social History   Substance and Sexual Activity  Drug Use Never    Social History   Socioeconomic History  . Marital status: Married    Spouse name: Not on file  . Number of children: 3  .  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 Highest education level: High school graduate  Occupational History  . Not on file  Social Needs  . Financial resource strain: Not on file  . Food insecurity    Worry: Not on file    Inability: Not on file  . Transportation needs    Medical: Not on file    Non-medical: Not on file  Tobacco Use  . Smoking status: Never Smoker  . Smokeless tobacco: Never Used  Substance and Sexual Activity  . Alcohol use: Never    Frequency: Never  . Drug use: Never  . Sexual activity: Not on file  Lifestyle  . Physical activity    Days per week: Not on file    Minutes per session: Not on file  . Stress: Not on file  Relationships  . Social Herbalist on phone: Not on file    Gets together: Not on file    Attends religious service: Not on file    Active member of club or organization: Not on file    Attends meetings of clubs or organizations: Not on file    Relationship status: Not on file  Other Topics Concern  . Not on file  Social History Narrative   Lives at [place] right now.    Right handed      Additional Social History: N/A    Allergies:   Allergies  Allergen Reactions  . Penicillins Swelling    REACTION: swelling    Labs:  No results found for this or any previous visit (from the past 48 hour(s)).  Medications:  Current Facility-Administered Medications  Medication Dose Route Frequency Provider Last Rate Last Dose  . amLODipine (NORVASC) tablet 2.5 mg  2.5 mg Oral Daily Ward, Kristen N, DO   2.5 mg at 02/19/19 0935  . aspirin EC tablet 81 mg  81 mg Oral Daily Ward, Kristen N, DO   81 mg at 02/19/19 0935  . cephALEXin (KEFLEX) capsule 500 mg  500 mg Oral Q12H Ward, Kristen N, DO   500 mg at 02/19/19 0935  . divalproex (DEPAKOTE) DR tablet 125 mg  125 mg Oral BID Ward, Kristen N, DO   125 mg at 02/19/19 0935  . donepezil (ARICEPT) tablet 10 mg  10 mg Oral QHS Ward, Kristen N, DO   10 mg at 02/18/19  2154  . levothyroxine (SYNTHROID) tablet 200 mcg  200 mcg Oral Q0600 Ward, Kristen N, DO   200 mcg at 02/19/19 7893  . omega-3 acid ethyl esters (LOVAZA) capsule 1 g  1 g Oral Daily Ward, Kristen N, DO   1 g at 02/19/19 0935  . QUEtiapine (SEROQUEL) tablet 100 mg  100 mg Oral QHS Ethelene Hal, NP   100 mg at 02/18/19 2154  . vitamin B-12 (CYANOCOBALAMIN) tablet 500 mcg  500 mcg Oral Daily Ward, Kristen N, DO   500 mcg at 02/19/19 8101   Current Outpatient Medications  Medication Sig Dispense Refill  . amLODipine (NORVASC) 2.5 MG tablet Take 2.5 mg by mouth daily.    Marland Kitchen  aspirin EC 81 MG tablet Take 81 mg by mouth daily.    . Cyanocobalamin (B-12 PO) Take 500 mcg by mouth daily.    . divalproex (DEPAKOTE) 125 MG DR tablet Take 125 mg by mouth 2 (two) times daily.    Marland Kitchen donepezil (ARICEPT) 10 MG tablet Take 1 tablet (10 mg total) by mouth at bedtime. 30 tablet 11  . levothyroxine (SYNTHROID, LEVOTHROID) 200 MCG tablet Take 200 mcg by mouth daily. **BRAND NAME ONLY** takes with 43mcg  2  . levothyroxine (SYNTHROID, LEVOTHROID) 50 MCG tablet Take 50 mcg by mouth daily before breakfast. **BRAND NAME ONLY** takes with 275mcg    . Omega-3 Fatty Acids (FISH OIL) 1000 MG CAPS Take 1,000 mg by mouth daily.    Marland Kitchen atorvastatin (LIPITOR) 80 MG tablet Take 1 tablet (80 mg total) by mouth daily. (Patient not taking: Reported on 02/17/2019) 30 tablet 0  . clopidogrel (PLAVIX) 75 MG tablet Take 1 tablet (75 mg total) by mouth daily. (Patient not taking: Reported on 02/17/2019) 30 tablet 0    Musculoskeletal: Strength & Muscle Tone: within normal limits Gait & Station: normal Patient leans: N/A  Psychiatric Specialty Exam: Physical Exam  Nursing note and vitals reviewed. Constitutional: She is oriented to person, place, and time. She appears well-developed and well-nourished.  HENT:  Head: Normocephalic and atraumatic.  Neck: Normal range of motion. Neck supple.  Cardiovascular: Normal rate.   Respiratory: Effort normal.  Musculoskeletal: Normal range of motion.  Neurological: She is alert and oriented to person, place, and time.  Psychiatric: Her behavior is normal. Judgment normal. Her mood appears anxious. Thought content is paranoid and delusional. Cognition and memory are normal.    Review of Systems  Psychiatric/Behavioral: Negative for substance abuse. Memory loss:  The patient is nervous/anxious.   All other systems reviewed and are negative.   Blood pressure (!) 149/61, pulse 75, temperature 98.6 F (37 C), resp. rate 18, SpO2 96 %.There is no height or weight on file to calculate BMI.  General Appearance: Fairly Groomed  Eye Contact:  Good  Speech:  Clear and Coherent  Volume:  Normal  Mood:  calm  Affect:  Appropriate and Congruent  Thought Process:  Goal Directed, Linear and Descriptions of Associations: Intact  Orientation:  Full (Time, Place, and Person)  Thought Content:  Delusions and Paranoid Ideation  Suicidal Thoughts:  No  Homicidal Thoughts:  No  Memory:  Immediate;   Fair Recent;   Fair Remote;   Fair  Judgement:  Impaired  Insight:  Lacking  Psychomotor Activity:  Decreased  Concentration:  Concentration: Good and Attention Span: Good  Recall:  [employer] of Knowledge:  Fair  Language:  Good  Akathisia:  Negative  Handed:  Right  AIMS (if indicated):   N/A  Assets:  Desire for Improvement Social Support  ADL's:  Intact  Cognition: No gross deficits. History of dementia.   Sleep:   N/A     Treatment Plan Summary: Daily contact with patient to assess and evaluate symptoms and progress in treatment and Medication management  Disposition: Recommend psychiatric Inpatient admission when medically cleared. Supportive therapy provided about ongoing stressors.  Patient seen face-to-face for psychiatric evaluation, patient has been accepted at [facility] today, 02/20/2019. Accepting provider is Dr. Gerrit Halls, MD. Number to call report is  [place], NP 02/19/19 2:22 PM   Attest to NP note

## 2019-02-19 NOTE — Progress Notes (Signed)
Awaiting sheriff department transport services.

## 2019-02-19 NOTE — Progress Notes (Signed)
Patient has been accepted at [facility] today, 02/20/2019. Accepting provider is Dr. Gerrit Halls, MD. Number to call report is [phone]. Ask for the [facility]. CSW informed Coralyn Mark, RN regarding patient disposition.   Rachael Carpenter. Rachael Carpenter, MSW, [place] Work/Disposition Phone: [phone] Fax: [phone]

## 2019-02-19 NOTE — Progress Notes (Signed)
Report given to receiving facility to [facility] at this time.

## 2019-02-19 NOTE — ED Provider Notes (Signed)
Vitals:   02/18/19 2205 02/19/19 0643  BP: 123/61 122/67  Pulse: 77 69  Resp: 15 15  Temp: 97.7 F (36.5 C) 97.7 F (36.5 C)  SpO2: 94% 95%   Medically stable.  Awaiting psych placement.   Dorie Rank, MD 02/19/19 [phone]

## 2019-02-19 NOTE — ED Notes (Signed)
Report on Situation, Background, Assessment, and Recommendations received from [place], [place]. Patient alert and in no visible distress. Patient denies SI, HI, AVH and pain. Patient made aware of staffing and security cameras for their safety. Patient instructed to come to me with needs or concerns.

## 2019-02-20 DIAGNOSIS — Z03818 Encounter for observation for suspected exposure to other biological agents ruled out: Secondary | ICD-10-CM | POA: Diagnosis not present

## 2019-03-01 DIAGNOSIS — T7800XA Anaphylactic reaction due to unspecified food, initial encounter: Secondary | ICD-10-CM | POA: Diagnosis not present

## 2019-03-01 DIAGNOSIS — Z79899 Other long term (current) drug therapy: Secondary | ICD-10-CM | POA: Diagnosis not present

## 2019-03-03 DIAGNOSIS — Z03818 Encounter for observation for suspected exposure to other biological agents ruled out: Secondary | ICD-10-CM | POA: Diagnosis not present

## 2019-03-12 DIAGNOSIS — D519 Vitamin B12 deficiency anemia, unspecified: Secondary | ICD-10-CM | POA: Diagnosis not present

## 2019-03-12 DIAGNOSIS — R441 Visual hallucinations: Secondary | ICD-10-CM | POA: Diagnosis not present

## 2019-03-12 DIAGNOSIS — I10 Essential (primary) hypertension: Secondary | ICD-10-CM | POA: Diagnosis not present

## 2019-03-12 DIAGNOSIS — F0391 Unspecified dementia with behavioral disturbance: Secondary | ICD-10-CM | POA: Diagnosis not present

## 2019-03-12 DIAGNOSIS — R440 Auditory hallucinations: Secondary | ICD-10-CM | POA: Diagnosis not present

## 2019-03-12 DIAGNOSIS — Z8673 Personal history of transient ischemic attack (TIA), and cerebral infarction without residual deficits: Secondary | ICD-10-CM | POA: Diagnosis not present

## 2019-03-16 DIAGNOSIS — R443 Hallucinations, unspecified: Secondary | ICD-10-CM | POA: Diagnosis not present

## 2019-03-16 DIAGNOSIS — E039 Hypothyroidism, unspecified: Secondary | ICD-10-CM | POA: Diagnosis not present

## 2019-03-16 DIAGNOSIS — I10 Essential (primary) hypertension: Secondary | ICD-10-CM | POA: Diagnosis not present

## 2019-03-16 DIAGNOSIS — F0391 Unspecified dementia with behavioral disturbance: Secondary | ICD-10-CM | POA: Diagnosis not present

## 2019-03-28 DIAGNOSIS — R441 Visual hallucinations: Secondary | ICD-10-CM | POA: Diagnosis not present

## 2019-03-28 DIAGNOSIS — I10 Essential (primary) hypertension: Secondary | ICD-10-CM | POA: Diagnosis not present

## 2019-03-28 DIAGNOSIS — F0391 Unspecified dementia with behavioral disturbance: Secondary | ICD-10-CM | POA: Diagnosis not present

## 2019-03-28 DIAGNOSIS — Z8673 Personal history of transient ischemic attack (TIA), and cerebral infarction without residual deficits: Secondary | ICD-10-CM | POA: Diagnosis not present

## 2019-03-28 DIAGNOSIS — D519 Vitamin B12 deficiency anemia, unspecified: Secondary | ICD-10-CM | POA: Diagnosis not present

## 2019-03-28 DIAGNOSIS — R440 Auditory hallucinations: Secondary | ICD-10-CM | POA: Diagnosis not present

## 2019-03-30 DIAGNOSIS — Z8673 Personal history of transient ischemic attack (TIA), and cerebral infarction without residual deficits: Secondary | ICD-10-CM | POA: Diagnosis not present

## 2019-03-30 DIAGNOSIS — R441 Visual hallucinations: Secondary | ICD-10-CM | POA: Diagnosis not present

## 2019-03-30 DIAGNOSIS — F0391 Unspecified dementia with behavioral disturbance: Secondary | ICD-10-CM | POA: Diagnosis not present

## 2019-03-30 DIAGNOSIS — I10 Essential (primary) hypertension: Secondary | ICD-10-CM | POA: Diagnosis not present

## 2019-03-30 DIAGNOSIS — R440 Auditory hallucinations: Secondary | ICD-10-CM | POA: Diagnosis not present

## 2019-03-30 DIAGNOSIS — D519 Vitamin B12 deficiency anemia, unspecified: Secondary | ICD-10-CM | POA: Diagnosis not present

## 2019-04-06 DIAGNOSIS — R441 Visual hallucinations: Secondary | ICD-10-CM | POA: Diagnosis not present

## 2019-04-06 DIAGNOSIS — R440 Auditory hallucinations: Secondary | ICD-10-CM | POA: Diagnosis not present

## 2019-04-06 DIAGNOSIS — Z8673 Personal history of transient ischemic attack (TIA), and cerebral infarction without residual deficits: Secondary | ICD-10-CM | POA: Diagnosis not present

## 2019-04-06 DIAGNOSIS — F0391 Unspecified dementia with behavioral disturbance: Secondary | ICD-10-CM | POA: Diagnosis not present

## 2019-04-06 DIAGNOSIS — I10 Essential (primary) hypertension: Secondary | ICD-10-CM | POA: Diagnosis not present

## 2019-04-06 DIAGNOSIS — D519 Vitamin B12 deficiency anemia, unspecified: Secondary | ICD-10-CM | POA: Diagnosis not present

## 2019-09-13 ENCOUNTER — Other Ambulatory Visit: Payer: Self-pay

## 2019-09-13 ENCOUNTER — Encounter (HOSPITAL_COMMUNITY): Payer: Self-pay | Admitting: Behavioral Health

## 2019-09-13 ENCOUNTER — Emergency Department (HOSPITAL_COMMUNITY)
Admission: EM | Admit: 2019-09-13 | Discharge: 2019-09-14 | Disposition: A | Discharge status: Home / Self Care | Payer: Medicare Other | Attending: Emergency Medicine | Admitting: Emergency Medicine

## 2019-09-13 DIAGNOSIS — E039 Hypothyroidism, unspecified: Secondary | ICD-10-CM | POA: Diagnosis not present

## 2019-09-13 DIAGNOSIS — F03918 Unspecified dementia, unspecified severity, with other behavioral disturbance: Secondary | ICD-10-CM | POA: Diagnosis present

## 2019-09-13 DIAGNOSIS — R442 Other hallucinations: Secondary | ICD-10-CM | POA: Diagnosis not present

## 2019-09-13 DIAGNOSIS — Z9049 Acquired absence of other specified parts of digestive tract: Secondary | ICD-10-CM | POA: Diagnosis not present

## 2019-09-13 DIAGNOSIS — F22 Delusional disorders: Secondary | ICD-10-CM | POA: Insufficient documentation

## 2019-09-13 DIAGNOSIS — I10 Essential (primary) hypertension: Secondary | ICD-10-CM | POA: Diagnosis not present

## 2019-09-13 DIAGNOSIS — F0391 Unspecified dementia with behavioral disturbance: Secondary | ICD-10-CM | POA: Insufficient documentation

## 2019-09-13 DIAGNOSIS — R443 Hallucinations, unspecified: Secondary | ICD-10-CM | POA: Diagnosis not present

## 2019-09-13 DIAGNOSIS — Z79899 Other long term (current) drug therapy: Secondary | ICD-10-CM | POA: Diagnosis not present

## 2019-09-13 DIAGNOSIS — U071 COVID-19: Secondary | ICD-10-CM | POA: Insufficient documentation

## 2019-09-13 DIAGNOSIS — F0390 Unspecified dementia without behavioral disturbance: Secondary | ICD-10-CM | POA: Diagnosis present

## 2019-09-13 DIAGNOSIS — Z7982 Long term (current) use of aspirin: Secondary | ICD-10-CM | POA: Insufficient documentation

## 2019-09-13 LAB — CBC WITH DIFFERENTIAL/PLATELET
Abs Immature Granulocytes: 0.03 10*3/uL (ref 0.00–0.07)
Basophils Absolute: 0.1 10*3/uL (ref 0.0–0.1)
Basophils Relative: 1 %
Eosinophils Absolute: 0.2 10*3/uL (ref 0.0–0.5)
Eosinophils Relative: 3 %
HCT: 42.8 % (ref 36.0–46.0)
Hemoglobin: 13.7 g/dL (ref 12.0–15.0)
Immature Granulocytes: 1 %
Lymphocytes Relative: 21 %
Lymphs Abs: 1.4 10*3/uL (ref 0.7–4.0)
MCH: 29.8 pg (ref 26.0–34.0)
MCHC: 32 g/dL (ref 30.0–36.0)
MCV: 93 fL (ref 80.0–100.0)
Monocytes Absolute: 0.6 10*3/uL (ref 0.1–1.0)
Monocytes Relative: 9 %
Neutro Abs: 4.4 10*3/uL (ref 1.7–7.7)
Neutrophils Relative %: 65 %
Platelets: 226 10*3/uL (ref 150–400)
RBC: 4.6 MIL/uL (ref 3.87–5.11)
RDW: 15 % (ref 11.5–15.5)
WBC: 6.7 10*3/uL (ref 4.0–10.5)
nRBC: 0 % (ref 0.0–0.2)

## 2019-09-13 LAB — URINALYSIS, ROUTINE W REFLEX MICROSCOPIC
Bacteria, UA: NONE SEEN
Bilirubin Urine: NEGATIVE
Glucose, UA: NEGATIVE mg/dL
Hgb urine dipstick: NEGATIVE
Ketones, ur: NEGATIVE mg/dL
Nitrite: NEGATIVE
Protein, ur: NEGATIVE mg/dL
Specific Gravity, Urine: 1.016 (ref 1.005–1.030)
pH: 7 (ref 5.0–8.0)

## 2019-09-13 LAB — COMPREHENSIVE METABOLIC PANEL
ALT: 17 U/L (ref 0–44)
AST: 26 U/L (ref 15–41)
Albumin: 4.1 g/dL (ref 3.5–5.0)
Alkaline Phosphatase: 75 U/L (ref 38–126)
Anion gap: 9 (ref 5–15)
BUN: 12 mg/dL (ref 8–23)
CO2: 25 mmol/L (ref 22–32)
Calcium: 8.7 mg/dL — ABNORMAL LOW (ref 8.9–10.3)
Chloride: 109 mmol/L (ref 98–111)
Creatinine, Ser: 1.25 mg/dL — ABNORMAL HIGH (ref 0.44–1.00)
GFR calc Af Amer: 46 mL/min — ABNORMAL LOW (ref >60)
GFR calc non Af Amer: 40 mL/min — ABNORMAL LOW (ref >60)
Glucose, Bld: 87 mg/dL (ref 70–99)
Potassium: 4 mmol/L (ref 3.5–5.1)
Sodium: 143 mmol/L (ref 135–145)
Total Bilirubin: 0.6 mg/dL (ref 0.3–1.2)
Total Protein: 7.1 g/dL (ref 6.5–8.1)

## 2019-09-13 LAB — AMMONIA: Ammonia: 43 umol/L — ABNORMAL HIGH (ref 9–35)

## 2019-09-13 LAB — TSH: TSH: 5.886 u[IU]/mL — ABNORMAL HIGH (ref 0.350–4.500)

## 2019-09-13 LAB — SARS CORONAVIRUS 2 (TAT 6-24 HRS): SARS Coronavirus 2: POSITIVE — AB

## 2019-09-13 MED ORDER — DONEPEZIL HCL 5 MG PO TABS
10.0000 mg | ORAL_TABLET | Freq: Every day | ORAL | Status: DC
  Administered 2019-09-13: 22:00:00 10 mg via ORAL
  Filled 2019-09-13: qty 2

## 2019-09-13 MED ORDER — FOLIC ACID 1 MG PO TABS
1.0000 mg | ORAL_TABLET | Freq: Every day | ORAL | Status: DC
  Administered 2019-09-13 – 2019-09-14 (×2): 1 mg via ORAL
  Filled 2019-09-13 (×2): qty 1

## 2019-09-13 MED ORDER — QUETIAPINE FUMARATE 100 MG PO TABS
200.0000 mg | ORAL_TABLET | Freq: Every day | ORAL | Status: DC
  Administered 2019-09-13 – 2019-09-14 (×2): 200 mg via ORAL
  Filled 2019-09-13 (×2): qty 2

## 2019-09-13 MED ORDER — AMLODIPINE BESYLATE 5 MG PO TABS
2.5000 mg | ORAL_TABLET | Freq: Every day | ORAL | Status: DC
  Administered 2019-09-14: 10:00:00 2.5 mg via ORAL
  Filled 2019-09-13: qty 1

## 2019-09-13 MED ORDER — VITAMIN B-6 100 MG PO TABS
100.0000 mg | ORAL_TABLET | Freq: Every day | ORAL | Status: DC
  Administered 2019-09-14: 10:00:00 100 mg via ORAL
  Filled 2019-09-13: qty 1

## 2019-09-13 MED ORDER — LEVOTHYROXINE SODIUM 50 MCG PO TABS: 50.0000 ug | ORAL_TABLET | Freq: Every day | ORAL | Status: DC

## 2019-09-13 MED ORDER — DIVALPROEX SODIUM 125 MG PO DR TAB
125.0000 mg | DELAYED_RELEASE_TABLET | Freq: Two times a day (BID) | ORAL | Status: DC
  Administered 2019-09-13: 22:00:00 125 mg via ORAL
  Filled 2019-09-13 (×2): qty 1

## 2019-09-13 MED ORDER — LEVOTHYROXINE SODIUM 125 MCG PO TABS
250.0000 ug | ORAL_TABLET | Freq: Every day | ORAL | Status: DC
  Administered 2019-09-14: 06:00:00 250 ug via ORAL
  Filled 2019-09-13: qty 2

## 2019-09-13 MED ORDER — ASPIRIN EC 81 MG PO TBEC
81.0000 mg | DELAYED_RELEASE_TABLET | Freq: Every day | ORAL | Status: DC
  Administered 2019-09-14: 10:00:00 81 mg via ORAL
  Filled 2019-09-13: qty 1

## 2019-09-13 NOTE — ED Notes (Signed)
Spoke with spouse and daughter.  Per Scotty Court, spouse. -  Pt "had some sort of episode with my grand daughter, I was taking a nap.Marland KitchenMarland KitchenOn her file she has dementia, she has been acting out more recently, cant make out any rhyme or reason to anything..." Spouse references using telehealth visits with little satisfaction.  Pt was seen at [facility] and referred to rehab center last fall.  "Demenita has gotten out of control.  She has not presented a danger to herself or anyone else."    Per Lattie Haw, daughter- "my mom does not take care of herself, she does not eat right, we dont know if she is taking her medications because she won't let us touch them." "verbally aggressive, not physically" "I told my daughter if she starts 'it up again, talking to someone that's not there' she should call the ambulance.' 'she barricades the door at night.' 'claims there someone outside'
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
## 2019-09-13 NOTE — ED Notes (Signed)
TTS machine placed at bedside.   

## 2019-09-13 NOTE — ED Notes (Signed)
Pt arrives in revealing shirt, no undershirt, poor hygiene.  Denies SI/HI, denies AVH.  Pt calm and cooperative at time of assessment.
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
## 2019-09-13 NOTE — ED Provider Notes (Signed)
Blood pressure (!) 146/72, pulse 82, temperature 97.8 F (36.6 C), temperature source Oral, resp. rate 16, SpO2 98 %.  Assuming care from Dr. Rex Kras.  In short, Rachael Carpenter is a 85 y.o. female with a chief complaint of No chief complaint on file. Marland Kitchen  Refer to the original H&P for additional details.  The current plan of care is to f/u with labs and reassess after TTS evaluation.  Patient's labs are nonspecific at this point.  Ammonia is slightly elevated with normal LFTs.  Patient is alert and oriented x3 and appropriate here.  Suspect she will need PCP follow-up for this but do not feel that this explains behavior.   UA without infection. Patient is medically clear. TTS has seen and recommends AM re-evaluation.     Margette Fast, MD 09/13/19 [phone]

## 2019-09-14 DIAGNOSIS — F0391 Unspecified dementia with behavioral disturbance: Secondary | ICD-10-CM

## 2019-09-14 DIAGNOSIS — U071 COVID-19: Secondary | ICD-10-CM | POA: Diagnosis not present

## 2019-09-14 IMAGING — MR MR HEAD WO/W CM
11 of 13 series · 39 of 48 positions shown · IV contrast (multihance)
Comparison: Prior CT from 11/15/2017.

CLINICAL DATA: Initial evaluation for acute encephalopathy.

EXAM:
MRI HEAD WITHOUT AND WITH CONTRAST
TECHNIQUE: Multiplanar, multiecho pulse sequences of the brain and surrounding
structures were obtained without and with intravenous contrast.
CONTRAST:  8mL MULTIHANCE GADOBENATE DIMEGLUMINE 529 MG/ML IV SOLN

[Series 5: ax dwi_tracew · axial · 3.0mm · 1.50mm/px · z∈[-62,+72]mm · 9 of 92 slices shown]
[im 1/92]
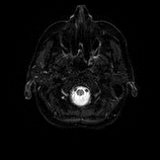
[im 12/92]
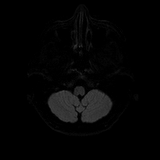
[im 23/92]
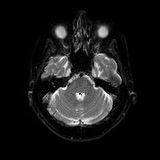
[im 35/92]
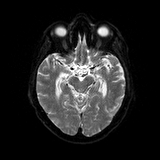
[im 46/92]
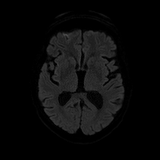
[im 57/92]
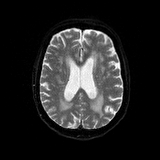
[im 69/92]
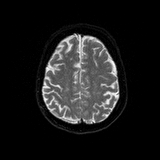
[im 80/92]
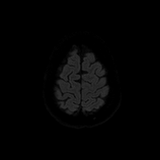
[im 92/92]
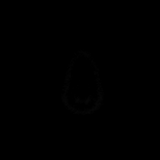

[Series 6: ax dwi_adc · axial · 3.0mm · 1.50mm/px · z∈[-62,+72]mm · 4 of 46 slices shown]
[im 1/46]
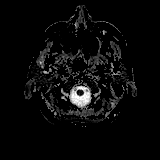
[im 16/46]
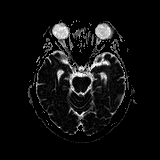
[im 31/46]
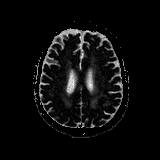
[im 46/46]
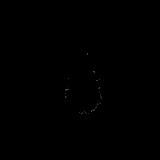

[Series 7: cor dwi_tracew · coronal · 5.0mm · 1.44mm/px · 6 of 68 slices shown]
[im 1/68]
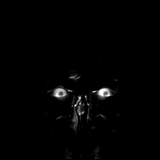
[im 14/68]
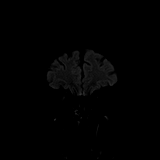
[im 27/68]
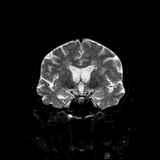
[im 41/68]
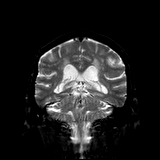
[im 54/68]
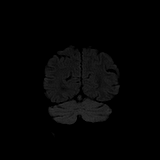
[im 68/68]
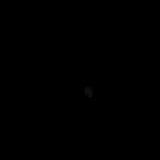

[Series 8: cor dwi_adc · coronal · 5.0mm · 1.44mm/px · 3 of 34 slices shown]
[im 1/34]
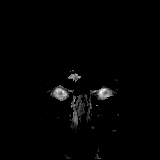
[im 17/34]
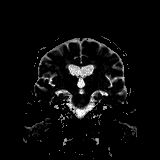
[im 34/34]
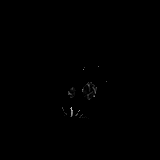

[Series 9: T1 · sagittal · 5.0mm · 0.75mm/px · 2 of 23 slices shown]
[im 1/23]
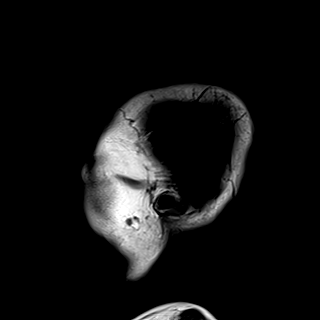
[im 23/23]
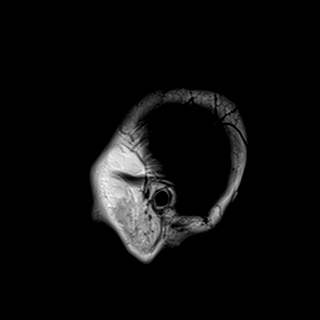

[Series 10: T2 · axial · 5.0mm · 0.72mm/px · z∈[-68,+75]mm · 2 of 25 slices shown]
[im 1/25]
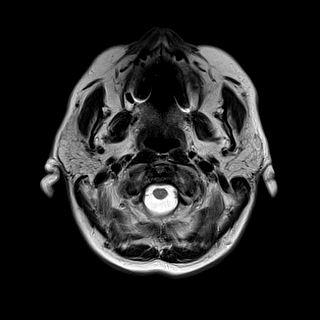
[im 25/25]
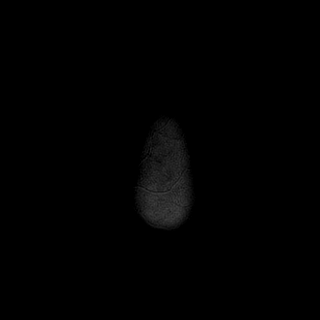

[Series 11: FLAIR · axial · 5.0mm · 0.45mm/px · z∈[-70,+74]mm · 2 of 25 slices shown]
[im 1/25]
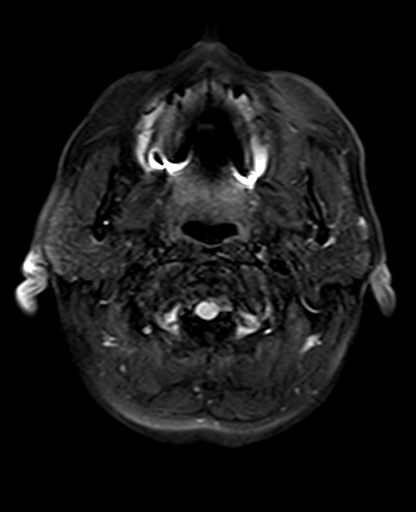
[im 25/25]
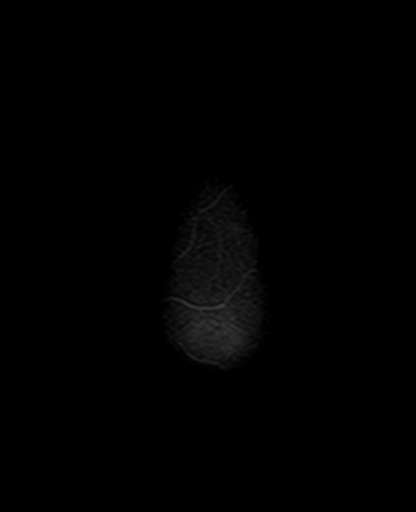

[Series 12: swi_images · axial · 3.0mm · 0.90mm/px · z∈[-69,+72]mm · 4 of 48 slices shown]
[im 1/48]
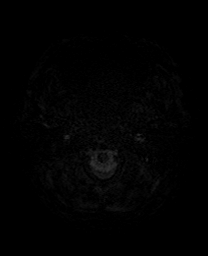
[im 16/48]
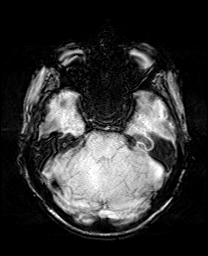
[im 32/48]
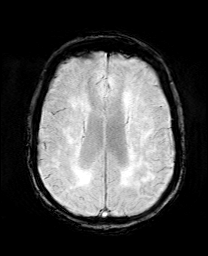
[im 48/48]
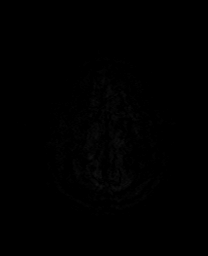

[Series 13: mip_images(sw) · axial · 24.0mm · 0.90mm/px · z∈[-58,+61]mm · 3 of 41 slices shown]
[im 1/41]
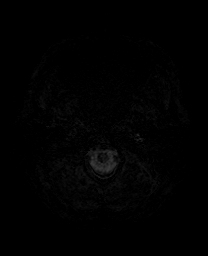
[im 21/41]
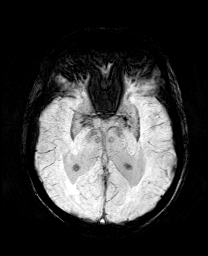
[im 41/41]
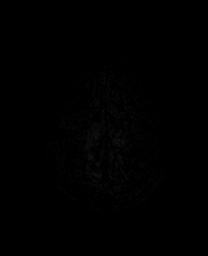

[Series 15: T2 post-contrast · coronal · 5.0mm · 0.72mm/px · 2 of 29 slices shown]
[im 1/29]
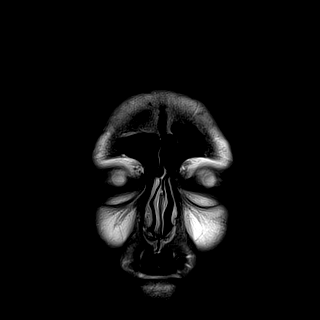
[im 29/29]
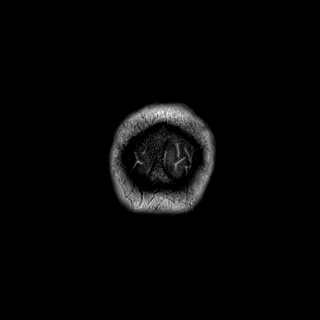

[Series 17: T1 post-contrast · coronal · 5.0mm · 0.34mm/px · 2 of 29 slices shown]
[im 1/29]
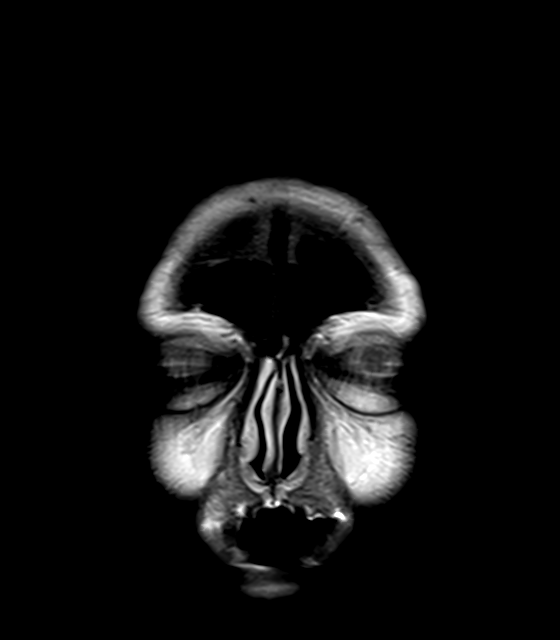
[im 29/29]
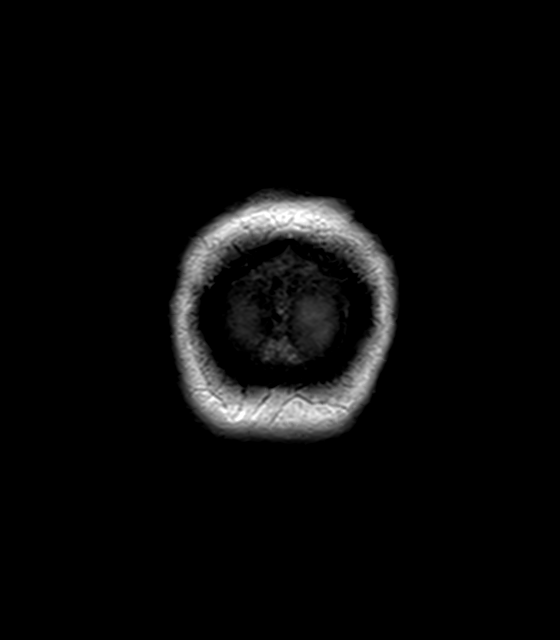

[39 of 48 positions shown; findings below may reference images not displayed]

FINDINGS: Brain: Generalized age-related cerebral volume loss. Patchy and
confluent T2/FLAIR hyperintensity within the periventricular and
deep white matter both cerebral hemispheres, most consistent with
chronic small vessel ischemic disease, moderate to advanced in
nature.

There is a single punctate 5 mm focus of restricted diffusion
involving the cortical gray matter of the anterior left frontal lobe
(series 5, image 76). Corresponding signal loss seen on ADC map
(series 6, image 30). Finding consistent with a tiny acute ischemic
infarct. No associated hemorrhage.

No other evidence for acute or subacute ischemia. Gray-white matter
differentiation otherwise maintained. No other areas of remote
cortical infarction. No acute or chronic intracranial hemorrhage.

No mass lesion, midline shift or mass effect. No hydrocephalus. No
extra-axial fluid collection. Normal pituitary gland. No abnormal
enhancement.

Vascular: Major intracranial vascular flow voids maintained

Skull and upper cervical spine: Craniocervical junction normal.
Upper cervical spine within normal limits. Bone marrow signal
intensity normal. No scalp soft tissue abnormality.

Sinuses/Orbits: Globes normal soft tissues within normal limits.
Paranasal sinuses are clear. Small right mastoid effusion noted, of
doubtful significance.

Other: None.
IMPRESSION: 1. Punctate 5 mm acute ischemic nonhemorrhagic left frontal cortical
infarct.
2. Underlying age-related atrophy with moderate to advanced chronic
small vessel ischemic disease.

## 2019-09-14 MED ORDER — DIVALPROEX SODIUM 250 MG PO DR TAB: 250.0000 mg | DELAYED_RELEASE_TABLET | Freq: Every day | ORAL | Status: DC

## 2019-09-14 MED ORDER — DIVALPROEX SODIUM 125 MG PO DR TAB
125.0000 mg | DELAYED_RELEASE_TABLET | Freq: Every day | ORAL | Status: DC
  Administered 2019-09-14: 10:00:00 125 mg via ORAL
  Filled 2019-09-14: qty 1

## 2019-09-14 NOTE — ED Notes (Addendum)
Patient's husband to be here around 2p to pick up patient.

## 2019-09-14 NOTE — BHH Suicide Risk Assessment (Cosign Needed)
Suicide Risk Assessment  Discharge Assessment   [facility] Discharge Suicide Risk Assessment   Principal Problem: Dementia with behavioral disturbance [facility]) Discharge Diagnoses: Principal Problem:   Dementia with behavioral disturbance ([place])   Total Time spent with patient: 30 minutes  Musculoskeletal: Strength & Muscle Tone: within normal limits Gait & Station: normal Patient leans: N/A  Psychiatric Specialty Exam:   Blood pressure 112/74, pulse 80, temperature 97.8 F (36.6 C), temperature source Oral, resp. rate 15, SpO2 100 %.There is no height or weight on file to calculate BMI.  General Appearance: Casual  Eye Contact::  Good  Speech:  Clear and Coherent and Normal Rate409  Volume:  Normal  Mood:  Euthymic and appropriately smiles and engages in the interview  Affect:  Appropriate and Congruent  Thought Process:  Coherent and Descriptions of Associations: Intact  Orientation:  Full (Time, Place, and Person)  Thought Content:  Logical  Suicidal Thoughts:  No  Homicidal Thoughts:  No  Memory:  Immediate;   Good Recent;   Good Remote;   Good  Judgement:  Other:  at the time of assessment, can vary throughout the day with dementia  Insight:  Fair  Psychomotor Activity:  Normal  Concentration:  Good  Recall:  Good  Fund of Knowledge:Fair  Language: Good  Akathisia:  Negative  Handed:  Right  AIMS (if indicated):     Assets:  Agricultural consultant Housing Social Support  Sleep:     Cognition: WNL, historically has dementia, responds appropriately all questions today.   ADL's:  Intact   Mental Status Per Nursing Assessment::   On Admission:     Demographic Factors:  Age 11 or older  Loss Factors: NA  Historical Factors: historically has dementia  Risk Reduction Factors:   Sense of responsibility to family, Living with another person, especially a relative, Positive social support and Positive therapeutic relationship  Continued  Clinical Symptoms:  Previous Psychiatric Diagnoses and Treatments; dementia symptoms  Cognitive Features That Contribute To Risk:  None    Suicide Risk:  Mild:  Suicidal ideation of limited frequency, intensity, duration, and specificity.  There are no identifiable plans, no associated intent, mild dysphoria and related symptoms, good self-control (both objective and subjective assessment), few other risk factors, and identifiable protective factors, including available and accessible social support.   Plan Of Care/Follow-up recommendations:  85 year old female voluntarily transported by EMS and admitted with for evaluation of delusions and hallucinations in the setting of progressing cognitive impairment.  The patient has a hx for dementia but is not currently followed by an outpatient neuropsychologist. On arrival, her U/A did was negative;  Patient seen for face to face evaluation with Dr. Dwyane Dee via telepsych.  On evaluation today, she is alert and oriented x4; clear and coherent and able to provide historical information regarding her present illness.  She did not have suicidal or homicidal concerns on admission and continues to deny this today.  She relates she lives with her husband, has 2 children, and does not think she has memory concerns or needs help in caring for herself. Although she does not seem to have insight to her illness, she also does not present as a safety concern either and would not benefit from inpatient care.  Collateral information received from her spouse, Delorise Shiner, who reports progressive dementia related concerns but denies safety concerns.  He is okay with his wife returning home and would like outpatient resources for care.    Her  depakote was adjusted from 125mg  BID to 125mg  daily am and 250mg  qhs to help with mood stability.      SW consult entered for neuropsych outpatient referral  The patient is psych cleared. The patient appears reasonably screened  and/or stabilized for discharge and does not appear to have emergencypsychiatric concerns/conditions requiring further screening, evaluation, or treatment at this time prior to discharge.    Spoke with Dr. Jeanell Sparrow, [place]; informed of above recommendation and disposition  Mallie Darting, NP 09/14/2019, 10:52 AM

## 2019-09-14 NOTE — Progress Notes (Signed)
TOC CM follow up on referral to arrange appt with Neuro Psych. Pt was scheduled appt to follow up outpt psychiatry with Dr Sanjuana Letters by TTS. [place], [place] ED TOC CM [phone]

## 2019-09-14 NOTE — ED Notes (Signed)
TTS cart at bedside. 

## 2019-09-14 NOTE — ED Notes (Signed)
Patient speaking to TTS at this time.  

## 2019-09-14 NOTE — Discharge Instructions (Signed)
Patient tested for covid and positive- patient denies symptoms.  Will need to quarantine per cdc reccomendations, but infection could have occurred up to 3 months ago.   Person Under Monitoring Name: Rachael Carpenter  Location: [place] [address]   Infection Prevention Recommendations for Individuals Confirmed to have, or Being Evaluated for, 2019 Novel Coronavirus (COVID-19) Infection Who Receive Care at Home  Individuals who are confirmed to have, or are being evaluated for, COVID-19 should follow the prevention steps below until a healthcare provider or local or state health department says they can return to normal activities.  Stay home except to get medical care You should restrict activities outside your home, except for getting medical care. Do not go to work, school, or public areas, and do not use public transportation or taxis.  Call ahead before visiting your doctor Before your medical appointment, call the healthcare provider and tell them that you have, or are being evaluated for, COVID-19 infection. This will help the healthcare provider's office take steps to keep other people from getting infected. Ask your healthcare provider to call the local or state health department.  Monitor your symptoms Seek prompt medical attention if your illness is worsening (e.g., difficulty breathing). Before going to your medical appointment, call the healthcare provider and tell them that you have, or are being evaluated for, COVID-19 infection. Ask your healthcare provider to call the local or state health department.  Wear a facemask You should wear a facemask that covers your nose and mouth when you are in the same room with other people and when you visit a healthcare provider. People who live with or visit you should also wear a facemask while they are in the same room with you.  Separate yourself from other people in your home As much as possible, you should stay  in a different room from other people in your home. Also, you should use a separate bathroom, if available.  Avoid sharing household items You should not share dishes, drinking glasses, cups, eating utensils, towels, bedding, or other items with other people in your home. After using these items, you should wash them thoroughly with soap and water.  Cover your coughs and sneezes Cover your mouth and nose with a tissue when you cough or sneeze, or you can cough or sneeze into your sleeve. Throw used tissues in a lined trash can, and immediately wash your hands with soap and water for at least 20 seconds or use an alcohol-based hand rub.  Wash your [facility] your hands often and thoroughly with soap and water for at least 20 seconds. You can use an alcohol-based hand sanitizer if soap and water are not available and if your hands are not visibly dirty. Avoid touching your eyes, nose, and mouth with unwashed hands.   Prevention Steps for Caregivers and Household Members of Individuals Confirmed to have, or Being Evaluated for, COVID-19 Infection Being Cared for in the Home  If you live with, or provide care at home for, a person confirmed to have, or being evaluated for, COVID-19 infection please follow these guidelines to prevent infection:  Follow healthcare provider's instructions Make sure that you understand and can help the patient follow any healthcare provider instructions for all care.  Provide for the patient's basic needs You should help the patient with basic needs in the home and provide support for getting groceries, prescriptions, and other personal needs.  Monitor the patient's symptoms If they are getting sicker,  call his or her medical provider and tell them that the patient has, or is being evaluated for, COVID-19 infection. This will help the healthcare provider's office take steps to keep other people from getting infected. Ask the healthcare provider to call the  local or state health department.  Limit the number of people who have contact with the patient  If possible, have only one caregiver for the patient.  Other household members should stay in another home or place of residence. If this is not possible, they should stay  in another room, or be separated from the patient as much as possible. Use a separate bathroom, if available.  Restrict visitors who do not have an essential need to be in the home.  Keep older adults, very young children, and other sick people away from the patient Keep older adults, very young children, and those who have compromised immune systems or chronic health conditions away from the patient. This includes people with chronic heart, lung, or kidney conditions, diabetes, and cancer.  Ensure good ventilation Make sure that shared spaces in the home have good air flow, such as from an air conditioner or an opened window, weather permitting.  Wash your hands often  Wash your hands often and thoroughly with soap and water for at least 20 seconds. You can use an alcohol based hand sanitizer if soap and water are not available and if your hands are not visibly dirty.  Avoid touching your eyes, nose, and mouth with unwashed hands.  Use disposable paper towels to dry your hands. If not available, use dedicated cloth towels and replace them when they become wet.  Wear a facemask and gloves  Wear a disposable facemask at all times in the room and gloves when you touch or have contact with the patient's blood, body fluids, and/or secretions or excretions, such as sweat, saliva, sputum, nasal mucus, vomit, urine, or feces.  Ensure the mask fits over your nose and mouth tightly, and do not touch it during use.  Throw out disposable facemasks and gloves after using them. Do not reuse.  Wash your hands immediately after removing your facemask and gloves.  If your personal clothing becomes contaminated, carefully remove  clothing and launder. Wash your hands after handling contaminated clothing.  Place all used disposable facemasks, gloves, and other waste in a lined container before disposing them with other household waste.  Remove gloves and wash your hands immediately after handling these items.  Do not share dishes, glasses, or other household items with the patient  Avoid sharing household items. You should not share dishes, drinking glasses, cups, eating utensils, towels, bedding, or other items with a patient who is confirmed to have, or being evaluated for, COVID-19 infection.  After the person uses these items, you should wash them thoroughly with soap and water.  Wash laundry thoroughly  Immediately remove and wash clothes or bedding that have blood, body fluids, and/or secretions or excretions, such as sweat, saliva, sputum, nasal mucus, vomit, urine, or feces, on them.  Wear gloves when handling laundry from the patient.  Read and follow directions on labels of laundry or clothing items and detergent. In general, wash and dry with the warmest temperatures recommended on the label.  Clean all areas the individual has used often  Clean all touchable surfaces, such as counters, tabletops, doorknobs, bathroom fixtures, toilets, phones, keyboards, tablets, and bedside tables, every day. Also, clean any surfaces that may have blood, body fluids, and/or secretions or excretions  on them.  Wear gloves when cleaning surfaces the patient has come in contact with.  Use a diluted bleach solution (e.g., dilute bleach with 1 part bleach and 10 parts water) or a household disinfectant with a label that says EPA-registered for coronaviruses. To make a bleach solution at home, add 1 tablespoon of bleach to 1 quart (4 cups) of water. For a larger supply, add  cup of bleach to 1 gallon (16 cups) of water.  Read labels of cleaning products and follow recommendations provided on product labels. Labels contain  instructions for safe and effective use of the cleaning product including precautions you should take when applying the product, such as wearing gloves or eye protection and making sure you have good ventilation during use of the product.  Remove gloves and wash hands immediately after cleaning.  Monitor yourself for signs and symptoms of illness Caregivers and household members are considered close contacts, should monitor their health, and will be asked to limit movement outside of the home to the extent possible. Follow the monitoring steps for close contacts listed on the symptom monitoring form.   ? If you have additional questions, contact your local health department or call the epidemiologist on call at [phone] (available 24/7). ? This guidance is subject to change. For the most up-to-date guidance from [facility], please refer to their website: [email or web address]   ------------------------------------------------------------------------------------------------------------------------------------  For your behavioral health needs, you are advised to follow up with an outpatient psychiatrist.  You have an intake appointment scheduled with Leanord Hawking, MD on Saturday, October 07, 2019 at 1:00 pm:       Leanord Hawking, MD      [facility]      [address]      [place], [place] [address]      [phone]

## 2019-09-14 NOTE — ED Notes (Signed)
Patient given sandwich, applesauce, and coffee at this time.

## 2019-09-14 NOTE — ED Provider Notes (Signed)
85 year old female history of dementia who was running yesterday after becoming agitated, paranoid, delusional after conflict with her granddaughter. Patient has history of thyroid issues which she reports are being addressed by Dr. Sharlett Iles.  Otherwise appears to be medically stable. Patient sitting up in bed requesting food.  She is hemodynamically stable Pleasant and interactive although has some poor recall of yesterday's events. Patient had positive Covid test yesterday. She denies exposure or symptoms. Patient stable per psych for d/c     Pattricia Boss, MD 09/14/19 1150

## 2019-09-14 NOTE — BH Assessment (Signed)
[facility] Assessment Progress Note  Per Merlyn Lot, FNP, this pt does not require psychiatric hospitalization at this time.  Pt is to be discharged from [facility] with recommendation to follow up with an outpatient psychiatrist.  After discussing this with both the pt and her husband, it was decided that this writer should arrange for an intake appointment. I called Adrika at [facility], scheduling pt for an intake appointment with Leanord Hawking, MD on Saturday, 10/07/19 at 13:00.  This has been included in pt's discharge instructions.  Pt's husband reports that he will be at [facility] to pick pt up today at 14:00.  Pt's nurse, Chrys Racer, has been notified.  Jalene Mullet, [facility] Triage Specialist [phone]

## 2020-12-06 DIAGNOSIS — I6930 Unspecified sequelae of cerebral infarction: Secondary | ICD-10-CM | POA: Diagnosis not present

## 2020-12-06 DIAGNOSIS — F29 Unspecified psychosis not due to a substance or known physiological condition: Secondary | ICD-10-CM | POA: Diagnosis not present

## 2020-12-06 DIAGNOSIS — E039 Hypothyroidism, unspecified: Secondary | ICD-10-CM | POA: Diagnosis not present

## 2020-12-06 DIAGNOSIS — F0391 Unspecified dementia with behavioral disturbance: Secondary | ICD-10-CM | POA: Diagnosis not present

## 2020-12-06 DIAGNOSIS — I10 Essential (primary) hypertension: Secondary | ICD-10-CM | POA: Diagnosis not present

## 2020-12-06 DIAGNOSIS — M5416 Radiculopathy, lumbar region: Secondary | ICD-10-CM | POA: Diagnosis not present

## 2021-04-14 DIAGNOSIS — I10 Essential (primary) hypertension: Secondary | ICD-10-CM | POA: Diagnosis not present

## 2021-04-14 DIAGNOSIS — F0391 Unspecified dementia with behavioral disturbance: Secondary | ICD-10-CM | POA: Diagnosis not present

## 2021-04-14 DIAGNOSIS — R2681 Unsteadiness on feet: Secondary | ICD-10-CM | POA: Diagnosis not present

## 2021-04-14 DIAGNOSIS — E039 Hypothyroidism, unspecified: Secondary | ICD-10-CM | POA: Diagnosis not present

## 2021-05-29 DIAGNOSIS — Z20822 Contact with and (suspected) exposure to covid-19: Secondary | ICD-10-CM | POA: Diagnosis not present

## 2021-09-03 DIAGNOSIS — Z20822 Contact with and (suspected) exposure to covid-19: Secondary | ICD-10-CM | POA: Diagnosis not present

## 2021-10-06 DIAGNOSIS — Z20822 Contact with and (suspected) exposure to covid-19: Secondary | ICD-10-CM | POA: Diagnosis not present

## 2021-10-14 DIAGNOSIS — Z20822 Contact with and (suspected) exposure to covid-19: Secondary | ICD-10-CM | POA: Diagnosis not present

## 2021-10-30 DIAGNOSIS — G301 Alzheimer's disease with late onset: Secondary | ICD-10-CM | POA: Diagnosis not present

## 2021-10-30 DIAGNOSIS — R2681 Unsteadiness on feet: Secondary | ICD-10-CM | POA: Diagnosis not present

## 2021-10-30 DIAGNOSIS — F02B2 Dementia in other diseases classified elsewhere, moderate, with psychotic disturbance: Secondary | ICD-10-CM | POA: Diagnosis not present

## 2021-10-30 DIAGNOSIS — E039 Hypothyroidism, unspecified: Secondary | ICD-10-CM | POA: Diagnosis not present

## 2021-10-30 DIAGNOSIS — I10 Essential (primary) hypertension: Secondary | ICD-10-CM | POA: Diagnosis not present

## 2021-10-30 DIAGNOSIS — L989 Disorder of the skin and subcutaneous tissue, unspecified: Secondary | ICD-10-CM | POA: Diagnosis not present

## 2021-10-30 DIAGNOSIS — F29 Unspecified psychosis not due to a substance or known physiological condition: Secondary | ICD-10-CM | POA: Diagnosis not present

## 2021-10-30 DIAGNOSIS — M25561 Pain in right knee: Secondary | ICD-10-CM | POA: Diagnosis not present

## 2021-10-30 DIAGNOSIS — G8929 Other chronic pain: Secondary | ICD-10-CM | POA: Diagnosis not present

## 2021-11-06 DIAGNOSIS — Z20822 Contact with and (suspected) exposure to covid-19: Secondary | ICD-10-CM | POA: Diagnosis not present

## 2021-11-17 DIAGNOSIS — Z20822 Contact with and (suspected) exposure to covid-19: Secondary | ICD-10-CM | POA: Diagnosis not present

## 2022-05-15 DIAGNOSIS — G301 Alzheimer's disease with late onset: Secondary | ICD-10-CM | POA: Diagnosis not present

## 2022-05-15 DIAGNOSIS — I10 Essential (primary) hypertension: Secondary | ICD-10-CM | POA: Diagnosis not present

## 2022-05-15 DIAGNOSIS — Z23 Encounter for immunization: Secondary | ICD-10-CM | POA: Diagnosis not present

## 2022-05-15 DIAGNOSIS — Z1339 Encounter for screening examination for other mental health and behavioral disorders: Secondary | ICD-10-CM | POA: Diagnosis not present

## 2022-05-15 DIAGNOSIS — Z1331 Encounter for screening for depression: Secondary | ICD-10-CM | POA: Diagnosis not present

## 2022-05-15 DIAGNOSIS — E785 Hyperlipidemia, unspecified: Secondary | ICD-10-CM | POA: Diagnosis not present

## 2022-05-15 DIAGNOSIS — F02B2 Dementia in other diseases classified elsewhere, moderate, with psychotic disturbance: Secondary | ICD-10-CM | POA: Diagnosis not present

## 2022-05-15 DIAGNOSIS — E039 Hypothyroidism, unspecified: Secondary | ICD-10-CM | POA: Diagnosis not present

## 2023-05-27 DIAGNOSIS — R062 Wheezing: Secondary | ICD-10-CM | POA: Diagnosis not present

## 2023-05-27 DIAGNOSIS — E039 Hypothyroidism, unspecified: Secondary | ICD-10-CM | POA: Diagnosis not present

## 2023-05-27 DIAGNOSIS — I110 Hypertensive heart disease with heart failure: Secondary | ICD-10-CM | POA: Diagnosis not present

## 2023-05-27 DIAGNOSIS — F0390 Unspecified dementia without behavioral disturbance: Secondary | ICD-10-CM | POA: Diagnosis not present

## 2023-06-01 DIAGNOSIS — R062 Wheezing: Secondary | ICD-10-CM | POA: Diagnosis not present

## 2023-06-01 DIAGNOSIS — F0390 Unspecified dementia without behavioral disturbance: Secondary | ICD-10-CM | POA: Diagnosis not present

## 2023-06-04 DIAGNOSIS — F0390 Unspecified dementia without behavioral disturbance: Secondary | ICD-10-CM | POA: Diagnosis not present

## 2023-06-04 DIAGNOSIS — I110 Hypertensive heart disease with heart failure: Secondary | ICD-10-CM | POA: Diagnosis not present

## 2023-06-04 DIAGNOSIS — E785 Hyperlipidemia, unspecified: Secondary | ICD-10-CM | POA: Diagnosis not present

## 2023-06-04 DIAGNOSIS — E039 Hypothyroidism, unspecified: Secondary | ICD-10-CM | POA: Diagnosis not present

## 2023-06-04 DIAGNOSIS — F22 Delusional disorders: Secondary | ICD-10-CM | POA: Diagnosis not present

## 2023-06-19 DIAGNOSIS — Z79899 Other long term (current) drug therapy: Secondary | ICD-10-CM | POA: Diagnosis not present

## 2023-06-19 DIAGNOSIS — I10 Essential (primary) hypertension: Secondary | ICD-10-CM | POA: Diagnosis not present

## 2023-06-19 DIAGNOSIS — E039 Hypothyroidism, unspecified: Secondary | ICD-10-CM | POA: Diagnosis not present

## 2023-06-19 DIAGNOSIS — Z7982 Long term (current) use of aspirin: Secondary | ICD-10-CM | POA: Diagnosis not present

## 2023-06-19 DIAGNOSIS — Z556 Problems related to health literacy: Secondary | ICD-10-CM | POA: Diagnosis not present

## 2023-06-19 DIAGNOSIS — F0390 Unspecified dementia without behavioral disturbance: Secondary | ICD-10-CM | POA: Diagnosis not present

## 2023-06-19 DIAGNOSIS — H9190 Unspecified hearing loss, unspecified ear: Secondary | ICD-10-CM | POA: Diagnosis not present

## 2023-06-22 DIAGNOSIS — Z556 Problems related to health literacy: Secondary | ICD-10-CM | POA: Diagnosis not present

## 2023-06-22 DIAGNOSIS — F0390 Unspecified dementia without behavioral disturbance: Secondary | ICD-10-CM | POA: Diagnosis not present

## 2023-06-22 DIAGNOSIS — Z7982 Long term (current) use of aspirin: Secondary | ICD-10-CM | POA: Diagnosis not present

## 2023-06-22 DIAGNOSIS — I10 Essential (primary) hypertension: Secondary | ICD-10-CM | POA: Diagnosis not present

## 2023-06-22 DIAGNOSIS — H9190 Unspecified hearing loss, unspecified ear: Secondary | ICD-10-CM | POA: Diagnosis not present

## 2023-06-22 DIAGNOSIS — E039 Hypothyroidism, unspecified: Secondary | ICD-10-CM | POA: Diagnosis not present

## 2023-06-23 DIAGNOSIS — F0390 Unspecified dementia without behavioral disturbance: Secondary | ICD-10-CM | POA: Diagnosis not present

## 2023-06-23 DIAGNOSIS — H9190 Unspecified hearing loss, unspecified ear: Secondary | ICD-10-CM | POA: Diagnosis not present

## 2023-06-23 DIAGNOSIS — Z7982 Long term (current) use of aspirin: Secondary | ICD-10-CM | POA: Diagnosis not present

## 2023-06-23 DIAGNOSIS — I10 Essential (primary) hypertension: Secondary | ICD-10-CM | POA: Diagnosis not present

## 2023-06-23 DIAGNOSIS — E039 Hypothyroidism, unspecified: Secondary | ICD-10-CM | POA: Diagnosis not present

## 2023-06-23 DIAGNOSIS — Z556 Problems related to health literacy: Secondary | ICD-10-CM | POA: Diagnosis not present

## 2023-07-02 DIAGNOSIS — F0390 Unspecified dementia without behavioral disturbance: Secondary | ICD-10-CM | POA: Diagnosis not present

## 2023-07-02 DIAGNOSIS — I10 Essential (primary) hypertension: Secondary | ICD-10-CM | POA: Diagnosis not present

## 2023-07-02 DIAGNOSIS — Z556 Problems related to health literacy: Secondary | ICD-10-CM | POA: Diagnosis not present

## 2023-07-02 DIAGNOSIS — H9190 Unspecified hearing loss, unspecified ear: Secondary | ICD-10-CM | POA: Diagnosis not present

## 2023-07-02 DIAGNOSIS — E039 Hypothyroidism, unspecified: Secondary | ICD-10-CM | POA: Diagnosis not present

## 2023-07-02 DIAGNOSIS — Z7982 Long term (current) use of aspirin: Secondary | ICD-10-CM | POA: Diagnosis not present

## 2023-07-05 DIAGNOSIS — I10 Essential (primary) hypertension: Secondary | ICD-10-CM | POA: Diagnosis not present

## 2023-07-05 DIAGNOSIS — Z7982 Long term (current) use of aspirin: Secondary | ICD-10-CM | POA: Diagnosis not present

## 2023-07-05 DIAGNOSIS — F0390 Unspecified dementia without behavioral disturbance: Secondary | ICD-10-CM | POA: Diagnosis not present

## 2023-07-05 DIAGNOSIS — H9190 Unspecified hearing loss, unspecified ear: Secondary | ICD-10-CM | POA: Diagnosis not present

## 2023-07-05 DIAGNOSIS — E039 Hypothyroidism, unspecified: Secondary | ICD-10-CM | POA: Diagnosis not present

## 2023-07-05 DIAGNOSIS — Z556 Problems related to health literacy: Secondary | ICD-10-CM | POA: Diagnosis not present

## 2023-07-06 DIAGNOSIS — F0390 Unspecified dementia without behavioral disturbance: Secondary | ICD-10-CM | POA: Diagnosis not present

## 2023-07-06 DIAGNOSIS — I110 Hypertensive heart disease with heart failure: Secondary | ICD-10-CM | POA: Diagnosis not present

## 2023-07-06 DIAGNOSIS — R062 Wheezing: Secondary | ICD-10-CM | POA: Diagnosis not present

## 2023-07-07 DIAGNOSIS — F0390 Unspecified dementia without behavioral disturbance: Secondary | ICD-10-CM | POA: Diagnosis not present

## 2023-07-07 DIAGNOSIS — F22 Delusional disorders: Secondary | ICD-10-CM | POA: Diagnosis not present

## 2023-07-07 DIAGNOSIS — E785 Hyperlipidemia, unspecified: Secondary | ICD-10-CM | POA: Diagnosis not present

## 2023-07-07 DIAGNOSIS — E039 Hypothyroidism, unspecified: Secondary | ICD-10-CM | POA: Diagnosis not present

## 2023-07-07 DIAGNOSIS — I10 Essential (primary) hypertension: Secondary | ICD-10-CM | POA: Diagnosis not present

## 2023-07-09 DIAGNOSIS — H9190 Unspecified hearing loss, unspecified ear: Secondary | ICD-10-CM | POA: Diagnosis not present

## 2023-07-09 DIAGNOSIS — F0390 Unspecified dementia without behavioral disturbance: Secondary | ICD-10-CM | POA: Diagnosis not present

## 2023-07-09 DIAGNOSIS — Z556 Problems related to health literacy: Secondary | ICD-10-CM | POA: Diagnosis not present

## 2023-07-09 DIAGNOSIS — Z7982 Long term (current) use of aspirin: Secondary | ICD-10-CM | POA: Diagnosis not present

## 2023-07-09 DIAGNOSIS — E039 Hypothyroidism, unspecified: Secondary | ICD-10-CM | POA: Diagnosis not present

## 2023-07-09 DIAGNOSIS — I10 Essential (primary) hypertension: Secondary | ICD-10-CM | POA: Diagnosis not present

## 2023-07-27 ENCOUNTER — Emergency Department (HOSPITAL_COMMUNITY): Payer: Medicare Other

## 2023-07-27 ENCOUNTER — Other Ambulatory Visit: Payer: Self-pay

## 2023-07-27 ENCOUNTER — Inpatient Hospital Stay (HOSPITAL_COMMUNITY)
Admission: EM | Admit: 2023-07-27 | Discharge: 2023-07-31 | DRG: 602 | Disposition: A | Discharge status: Skilled Nursing Facility | Payer: Medicare Other | Attending: Internal Medicine | Admitting: Internal Medicine

## 2023-07-27 ENCOUNTER — Encounter (HOSPITAL_COMMUNITY): Payer: Self-pay | Admitting: Emergency Medicine

## 2023-07-27 DIAGNOSIS — Z8673 Personal history of transient ischemic attack (TIA), and cerebral infarction without residual deficits: Secondary | ICD-10-CM | POA: Diagnosis not present

## 2023-07-27 DIAGNOSIS — Z79890 Hormone replacement therapy (postmenopausal): Secondary | ICD-10-CM | POA: Diagnosis not present

## 2023-07-27 DIAGNOSIS — D631 Anemia in chronic kidney disease: Secondary | ICD-10-CM | POA: Diagnosis present

## 2023-07-27 DIAGNOSIS — F0390 Unspecified dementia without behavioral disturbance: Secondary | ICD-10-CM | POA: Diagnosis not present

## 2023-07-27 DIAGNOSIS — L89311 Pressure ulcer of right buttock, stage 1: Secondary | ICD-10-CM | POA: Diagnosis present

## 2023-07-27 DIAGNOSIS — Z880 Allergy status to penicillin: Secondary | ICD-10-CM

## 2023-07-27 DIAGNOSIS — L89321 Pressure ulcer of left buttock, stage 1: Secondary | ICD-10-CM | POA: Diagnosis present

## 2023-07-27 DIAGNOSIS — L03119 Cellulitis of unspecified part of limb: Secondary | ICD-10-CM

## 2023-07-27 DIAGNOSIS — M5416 Radiculopathy, lumbar region: Secondary | ICD-10-CM | POA: Diagnosis not present

## 2023-07-27 DIAGNOSIS — Z7401 Bed confinement status: Secondary | ICD-10-CM | POA: Diagnosis not present

## 2023-07-27 DIAGNOSIS — E871 Hypo-osmolality and hyponatremia: Secondary | ICD-10-CM | POA: Diagnosis not present

## 2023-07-27 DIAGNOSIS — M8580 Other specified disorders of bone density and structure, unspecified site: Secondary | ICD-10-CM | POA: Diagnosis not present

## 2023-07-27 DIAGNOSIS — I129 Hypertensive chronic kidney disease with stage 1 through stage 4 chronic kidney disease, or unspecified chronic kidney disease: Secondary | ICD-10-CM | POA: Diagnosis present

## 2023-07-27 DIAGNOSIS — E039 Hypothyroidism, unspecified: Secondary | ICD-10-CM | POA: Diagnosis present

## 2023-07-27 DIAGNOSIS — F03918 Unspecified dementia, unspecified severity, with other behavioral disturbance: Secondary | ICD-10-CM | POA: Diagnosis not present

## 2023-07-27 DIAGNOSIS — I959 Hypotension, unspecified: Secondary | ICD-10-CM | POA: Diagnosis not present

## 2023-07-27 DIAGNOSIS — F05 Delirium due to known physiological condition: Secondary | ICD-10-CM | POA: Diagnosis present

## 2023-07-27 DIAGNOSIS — L299 Pruritus, unspecified: Secondary | ICD-10-CM | POA: Diagnosis not present

## 2023-07-27 DIAGNOSIS — E785 Hyperlipidemia, unspecified: Secondary | ICD-10-CM | POA: Diagnosis present

## 2023-07-27 DIAGNOSIS — F29 Unspecified psychosis not due to a substance or known physiological condition: Secondary | ICD-10-CM | POA: Diagnosis not present

## 2023-07-27 DIAGNOSIS — R600 Localized edema: Secondary | ICD-10-CM | POA: Diagnosis not present

## 2023-07-27 DIAGNOSIS — I6782 Cerebral ischemia: Secondary | ICD-10-CM | POA: Diagnosis not present

## 2023-07-27 DIAGNOSIS — R296 Repeated falls: Secondary | ICD-10-CM | POA: Diagnosis present

## 2023-07-27 DIAGNOSIS — R5381 Other malaise: Secondary | ICD-10-CM | POA: Diagnosis present

## 2023-07-27 DIAGNOSIS — R4182 Altered mental status, unspecified: Secondary | ICD-10-CM | POA: Diagnosis not present

## 2023-07-27 DIAGNOSIS — Z66 Do not resuscitate: Secondary | ICD-10-CM | POA: Diagnosis not present

## 2023-07-27 DIAGNOSIS — M25572 Pain in left ankle and joints of left foot: Secondary | ICD-10-CM | POA: Diagnosis not present

## 2023-07-27 DIAGNOSIS — R0902 Hypoxemia: Secondary | ICD-10-CM | POA: Diagnosis not present

## 2023-07-27 DIAGNOSIS — I10 Essential (primary) hypertension: Secondary | ICD-10-CM | POA: Diagnosis not present

## 2023-07-27 DIAGNOSIS — D638 Anemia in other chronic diseases classified elsewhere: Secondary | ICD-10-CM | POA: Diagnosis not present

## 2023-07-27 DIAGNOSIS — Z7982 Long term (current) use of aspirin: Secondary | ICD-10-CM | POA: Diagnosis not present

## 2023-07-27 DIAGNOSIS — G9340 Encephalopathy, unspecified: Secondary | ICD-10-CM | POA: Diagnosis not present

## 2023-07-27 DIAGNOSIS — M47894 Other spondylosis, thoracic region: Secondary | ICD-10-CM | POA: Diagnosis not present

## 2023-07-27 DIAGNOSIS — M6282 Rhabdomyolysis: Secondary | ICD-10-CM | POA: Diagnosis present

## 2023-07-27 DIAGNOSIS — M7989 Other specified soft tissue disorders: Secondary | ICD-10-CM | POA: Diagnosis not present

## 2023-07-27 DIAGNOSIS — M6281 Muscle weakness (generalized): Secondary | ICD-10-CM | POA: Diagnosis not present

## 2023-07-27 DIAGNOSIS — Z9181 History of falling: Secondary | ICD-10-CM | POA: Diagnosis not present

## 2023-07-27 DIAGNOSIS — L03317 Cellulitis of buttock: Secondary | ICD-10-CM | POA: Diagnosis not present

## 2023-07-27 DIAGNOSIS — Z1152 Encounter for screening for COVID-19: Secondary | ICD-10-CM | POA: Diagnosis not present

## 2023-07-27 DIAGNOSIS — E669 Obesity, unspecified: Secondary | ICD-10-CM | POA: Diagnosis present

## 2023-07-27 DIAGNOSIS — N189 Chronic kidney disease, unspecified: Secondary | ICD-10-CM | POA: Diagnosis not present

## 2023-07-27 DIAGNOSIS — R41841 Cognitive communication deficit: Secondary | ICD-10-CM | POA: Diagnosis not present

## 2023-07-27 DIAGNOSIS — F03C18 Unspecified dementia, severe, with other behavioral disturbance: Principal | ICD-10-CM

## 2023-07-27 DIAGNOSIS — N1831 Chronic kidney disease, stage 3a: Secondary | ICD-10-CM | POA: Diagnosis not present

## 2023-07-27 DIAGNOSIS — G9341 Metabolic encephalopathy: Secondary | ICD-10-CM | POA: Diagnosis present

## 2023-07-27 DIAGNOSIS — R748 Abnormal levels of other serum enzymes: Secondary | ICD-10-CM | POA: Diagnosis present

## 2023-07-27 DIAGNOSIS — L0390 Cellulitis, unspecified: Secondary | ICD-10-CM | POA: Diagnosis not present

## 2023-07-27 DIAGNOSIS — M7732 Calcaneal spur, left foot: Secondary | ICD-10-CM | POA: Diagnosis not present

## 2023-07-27 DIAGNOSIS — Z825 Family history of asthma and other chronic lower respiratory diseases: Secondary | ICD-10-CM

## 2023-07-27 DIAGNOSIS — I771 Stricture of artery: Secondary | ICD-10-CM | POA: Diagnosis not present

## 2023-07-27 DIAGNOSIS — I6930 Unspecified sequelae of cerebral infarction: Secondary | ICD-10-CM | POA: Diagnosis not present

## 2023-07-27 DIAGNOSIS — R000 Tachycardia, unspecified: Secondary | ICD-10-CM | POA: Diagnosis not present

## 2023-07-27 DIAGNOSIS — Z79899 Other long term (current) drug therapy: Secondary | ICD-10-CM

## 2023-07-27 DIAGNOSIS — D649 Anemia, unspecified: Secondary | ICD-10-CM | POA: Diagnosis present

## 2023-07-27 DIAGNOSIS — Z860100 Personal history of colon polyps, unspecified: Secondary | ICD-10-CM | POA: Diagnosis not present

## 2023-07-27 DIAGNOSIS — I6523 Occlusion and stenosis of bilateral carotid arteries: Secondary | ICD-10-CM | POA: Diagnosis not present

## 2023-07-27 DIAGNOSIS — Z888 Allergy status to other drugs, medicaments and biological substances status: Secondary | ICD-10-CM

## 2023-07-27 DIAGNOSIS — K219 Gastro-esophageal reflux disease without esophagitis: Secondary | ICD-10-CM | POA: Diagnosis not present

## 2023-07-27 DIAGNOSIS — I517 Cardiomegaly: Secondary | ICD-10-CM | POA: Diagnosis not present

## 2023-07-27 DIAGNOSIS — G301 Alzheimer's disease with late onset: Secondary | ICD-10-CM | POA: Diagnosis not present

## 2023-07-27 DIAGNOSIS — Z90710 Acquired absence of both cervix and uterus: Secondary | ICD-10-CM | POA: Diagnosis not present

## 2023-07-27 DIAGNOSIS — K5900 Constipation, unspecified: Secondary | ICD-10-CM | POA: Diagnosis not present

## 2023-07-27 DIAGNOSIS — R531 Weakness: Secondary | ICD-10-CM

## 2023-07-27 DIAGNOSIS — M150 Primary generalized (osteo)arthritis: Secondary | ICD-10-CM | POA: Diagnosis not present

## 2023-07-27 LAB — CBC WITH DIFFERENTIAL/PLATELET
Abs Immature Granulocytes: 0.04 10*3/uL (ref 0.00–0.07)
Basophils Absolute: 0 10*3/uL (ref 0.0–0.1)
Basophils Relative: 0 %
Eosinophils Absolute: 0.1 10*3/uL (ref 0.0–0.5)
Eosinophils Relative: 1 %
HCT: 29.1 % — ABNORMAL LOW (ref 36.0–46.0)
Hemoglobin: 8.6 g/dL — ABNORMAL LOW (ref 12.0–15.0)
Immature Granulocytes: 0 %
Lymphocytes Relative: 18 %
Lymphs Abs: 2 10*3/uL (ref 0.7–4.0)
MCH: 23.4 pg — ABNORMAL LOW (ref 26.0–34.0)
MCHC: 29.6 g/dL — ABNORMAL LOW (ref 30.0–36.0)
MCV: 79.3 fL — ABNORMAL LOW (ref 80.0–100.0)
Monocytes Absolute: 1 10*3/uL (ref 0.1–1.0)
Monocytes Relative: 9 %
Neutro Abs: 8.3 10*3/uL — ABNORMAL HIGH (ref 1.7–7.7)
Neutrophils Relative %: 72 %
Platelets: 317 10*3/uL (ref 150–400)
RBC: 3.67 MIL/uL — ABNORMAL LOW (ref 3.87–5.11)
RDW: 17.7 % — ABNORMAL HIGH (ref 11.5–15.5)
WBC: 11.5 10*3/uL — ABNORMAL HIGH (ref 4.0–10.5)
nRBC: 0 % (ref 0.0–0.2)

## 2023-07-27 LAB — COMPREHENSIVE METABOLIC PANEL
ALT: 15 U/L (ref 0–44)
AST: 34 U/L (ref 15–41)
Albumin: 3.6 g/dL (ref 3.5–5.0)
Alkaline Phosphatase: 64 U/L (ref 38–126)
Anion gap: 11 (ref 5–15)
BUN: 23 mg/dL (ref 8–23)
CO2: 21 mmol/L — ABNORMAL LOW (ref 22–32)
Calcium: 8.5 mg/dL — ABNORMAL LOW (ref 8.9–10.3)
Chloride: 101 mmol/L (ref 98–111)
Creatinine, Ser: 1.07 mg/dL — ABNORMAL HIGH (ref 0.44–1.00)
GFR, Estimated: 51 mL/min — ABNORMAL LOW (ref >60)
Glucose, Bld: 107 mg/dL — ABNORMAL HIGH (ref 70–99)
Potassium: 4.2 mmol/L (ref 3.5–5.1)
Sodium: 133 mmol/L — ABNORMAL LOW (ref 135–145)
Total Bilirubin: 0.6 mg/dL (ref 0.0–1.2)
Total Protein: 7.1 g/dL (ref 6.5–8.1)

## 2023-07-27 LAB — URINALYSIS, W/ REFLEX TO CULTURE (INFECTION SUSPECTED)
Bacteria, UA: NONE SEEN
Bilirubin Urine: NEGATIVE
Glucose, UA: NEGATIVE mg/dL
Hgb urine dipstick: NEGATIVE
Ketones, ur: NEGATIVE mg/dL
Leukocytes,Ua: NEGATIVE
Nitrite: NEGATIVE
Protein, ur: NEGATIVE mg/dL
Specific Gravity, Urine: 1.028 (ref 1.005–1.030)
pH: 5 (ref 5.0–8.0)

## 2023-07-27 LAB — CK: Total CK: 447 U/L — ABNORMAL HIGH (ref 38–234)

## 2023-07-27 LAB — TSH: TSH: 13.47 u[IU]/mL — ABNORMAL HIGH (ref 0.350–4.500)

## 2023-07-27 LAB — RESP PANEL BY RT-PCR (RSV, FLU A&B, COVID)  RVPGX2
Influenza A by PCR: NEGATIVE
Influenza B by PCR: NEGATIVE
Resp Syncytial Virus by PCR: NEGATIVE
SARS Coronavirus 2 by RT PCR: NEGATIVE

## 2023-07-27 LAB — I-STAT CG4 LACTIC ACID, ED: Lactic Acid, Venous: 1.7 mmol/L (ref 0.5–1.9)

## 2023-07-27 MED ORDER — ONDANSETRON HCL 4 MG PO TABS: 4.0000 mg | ORAL_TABLET | Freq: Four times a day (QID) | ORAL | Status: DC | PRN

## 2023-07-27 MED ORDER — ENOXAPARIN SODIUM 40 MG/0.4ML IJ SOSY
40.0000 mg | PREFILLED_SYRINGE | INTRAMUSCULAR | Status: DC
  Administered 2023-07-28 – 2023-07-31 (×4): 40 mg via SUBCUTANEOUS
  Filled 2023-07-27 (×4): qty 0.4

## 2023-07-27 MED ORDER — SENNOSIDES-DOCUSATE SODIUM 8.6-50 MG PO TABS: 1.0000 | ORAL_TABLET | Freq: Every evening | ORAL | Status: DC | PRN

## 2023-07-27 MED ORDER — ONDANSETRON HCL 4 MG/2ML IJ SOLN: 4.0000 mg | Freq: Four times a day (QID) | INTRAMUSCULAR | Status: DC | PRN

## 2023-07-27 MED ORDER — BISACODYL 5 MG PO TBEC: 5.0000 mg | DELAYED_RELEASE_TABLET | Freq: Every day | ORAL | Status: DC | PRN

## 2023-07-27 MED ORDER — CEFAZOLIN SODIUM-DEXTROSE 1-4 GM/50ML-% IV SOLN
1.0000 g | Freq: Three times a day (TID) | INTRAVENOUS | Status: DC
  Administered 2023-07-28 – 2023-07-31 (×10): 1 g via INTRAVENOUS
  Filled 2023-07-27 (×10): qty 50

## 2023-07-27 MED ORDER — ACETAMINOPHEN 650 MG RE SUPP: 650.0000 mg | Freq: Four times a day (QID) | RECTAL | Status: DC | PRN

## 2023-07-27 MED ORDER — ACETAMINOPHEN 325 MG PO TABS: 650.0000 mg | ORAL_TABLET | Freq: Four times a day (QID) | ORAL | Status: DC | PRN

## 2023-07-27 MED ORDER — VANCOMYCIN HCL IN DEXTROSE 1-5 GM/200ML-% IV SOLN
1000.0000 mg | Freq: Once | INTRAVENOUS | Status: AC
  Administered 2023-07-27: 1000 mg via INTRAVENOUS
  Filled 2023-07-27: qty 200

## 2023-07-27 MED ORDER — SODIUM CHLORIDE 0.9 % IV SOLN
2.0000 g | Freq: Once | INTRAVENOUS | Status: AC
  Administered 2023-07-27: 2 g via INTRAVENOUS
  Filled 2023-07-27: qty 12.5

## 2023-07-27 NOTE — Hospital Course (Addendum)
 89 y/o female with medical history significant for dementia, CKD stage IIIa, HTN, HLD, hypothyroidism, obesity presented from home with weakness increased confusion from baseline-who is admitted with cellulitis of the buttocks and generalized weakness.  She does have advanced dementia, has chronic swelling edema of her lower extremities recently having left ankle pain unable to see PCP due to difficulty with transport, at baseline she cannot stand up on her own and with some effort can walk a little bit with use of walker. In the ED low-grade temperature,Labs show WBC 11.5, hemoglobin 8.6, platelets 317,000, sodium 133, potassium 4.2, bicarb 21, BUN 23, creatinine 1.07, serum glucose 107, LFTs within normal limits, CK4 147.  TSH 13.470, valproate level in process.  Lactic acid 1.7.  UA negative for UTI.  Blood cultures in process.  SARS-CoV-2, influenza, Sleepeaze are negative. CT head without contrast negative for acute intracranial process.  Mild diffuse atrophy and moderate chronic small vessel ischemic changes noted. CXR> negative for focal consolidation, Dema, effusion. Pelvic x-ray negative for evidence of pelvic fractures or diastases.  Degenerative changes noted. Patient was given IV vancomycin  and cefepime  the hospitalist service was consulted to admit for further evaluation and management. Patient remains deconditioned and weak PT OT recommending skilled nursing facility At this time waiting for placement and has been approved on 07/31/23

## 2023-07-27 NOTE — Progress Notes (Signed)
 ED Pharmacy Antibiotic Sign Off An antibiotic consult was received from an ED provider for cefepime  per pharmacy dosing for sepsis. A chart review was completed to assess appropriateness.   Patient noted to have a penicillin allergy, however, she has received both cephalexin  and cefazolin  in the past with no reported issues.   The following one time order(s) were placed:  Cefepime  2g IV x1  Further antibiotic and/or antibiotic pharmacy consults should be ordered by the admitting provider if indicated.   Thank you for allowing pharmacy to be a part of this patient's care.   Lacinda Moats, PharmD Clinical Pharmacist  1/7/20259:32 PM

## 2023-07-27 NOTE — ED Notes (Signed)
 Provider at bedside

## 2023-07-27 NOTE — H&P (Signed)
 History and Physical    Rachael Carpenter FMW:[id number] DOB: [birth year] DOA: 07/27/2023  PCP: Yolande Toribio MATSU, MD  Patient coming from: Home  I have personally briefly reviewed patient's old medical records in [facility]  Chief Complaint: Weakness, increased confusion from baseline  HPI: Rachael Carpenter is a 89 y.o. female with medical history significant for dementia, CKD stage IIIa, HTN, HLD, hypothyroidism, obesity who presented to the ED from home for evaluation of increased confusion from baseline, weakness.  History is limited from patient due to dementia and is otherwise supplemented by EDP, chart review, and her husband by phone.  Patient has been living at home with her husband who has been caring for her.  He says at baseline she can stand up on her own with some effort and walk a little bit with use of a walker.  The last 3 days she has not been able to do either of these and has been just sitting in the chair.  He has been try to help her get to the bathroom but this has been very difficult.  Today he was not able to get her help at all.  She in the soiling herself while sitting on the couch.  He called EMS due to inability to care for her and patient was brought to the ED for further evaluation.  From spouse's report, she does have advanced dementia at baseline.  She has chronic swelling/edema to her lower extremities.  She was recently complaining of left ankle pain.  She has not been able to see her PCP due to difficulty with transport.  She has had some nursing and therapy come to the house.  ED Course  Labs/Imaging on admission: I have personally reviewed following labs and imaging studies.  Initial vitals showed BP 146/58, pulse 78, RR 22, temp 99.0 F rectally, SpO2 100% on room air.  Labs show WBC 11.5, hemoglobin 8.6, platelets 317,000, sodium 133, potassium 4.2, bicarb 21, BUN 23, creatinine 1.07, serum glucose 107, LFTs within normal limits, CK4 147.  TSH 13.470,  valproate level in process.  Lactic acid 1.7.  UA negative for UTI.  Blood cultures in process.  SARS-CoV-2, influenza, Sleepeaze are negative.  CT head without contrast negative for acute intracranial process.  Mild diffuse atrophy and moderate chronic small vessel ischemic changes noted.  Portable chest x-ray negative for focal consolidation, Dema, effusion.  Pelvic x-ray negative for evidence of pelvic fractures or diastases.  Degenerative changes noted.  Patient was given IV vancomycin  and cefepime  the hospitalist service was consulted to admit for further evaluation and management.  Review of Systems:  Unable to obtain full review of systems due to dementia.   Past Medical History:  Diagnosis Date   Acute kidney failure (HCC) 02/2018   Cerebral infarction, unspecified (HCC) 02/2018   Colon polyp    in cecum on July 2010 Colonoscopy   Dementia [facility])    Hallucinations    Hyperlipidemia    Hypertension    Hypothyroidism    Intestinal malrotation    on CT scan 2010 in [facility]   Metabolic encephalopathy    Neck pain    L; pt denies as of 02/24/18   Suicidal ideations    Thyroid  disease    Vitamin B12 deficiency anemia     Past Surgical History:  Procedure Laterality Date   4th finger flexor sheath ganglion Left 02/2006   CHOLECYSTECTOMY  1990s   LUMBAR SPINE SURGERY  1983  RIGHT FOOT MOLE EXCISION Right 2011   scalp lesion removal  2019   SHOULDER RECONSTRUCTION Right 07/2001   SHOULDER SURGERY Right    TOTAL ABDOMINAL HYSTERECTOMY     89 y.o.    Social History:  reports that she has never smoked. She has never used smokeless tobacco. She reports that she does not drink alcohol  and does not use drugs.  Allergies  Allergen Reactions   Penicillins Swelling    REACTION: swelling  Did it involve swelling of the face/tongue/throat, SOB, or low BP? Y Did it involve sudden or severe rash/hives, skin peeling, or any reaction on the inside of your mouth or nose?  Y Did you need to seek medical attention at a hospital or doctor's office? Unknown When did it last happen? Decades ago.       If all above answers are "NO", may proceed with cephalosporin use.     Family History  Problem Relation Age of Onset   COPD Sister    Cancer Daughter    Cancer Brother    Cancer Brother    Dementia Neg Hx      Prior to Admission medications   Medication Sig Start Date End Date Taking? Authorizing Provider  amLODipine  (NORVASC ) 2.5 MG tablet Take 2.5 mg by mouth daily.    [provider]  aspirin  EC 81 MG tablet Take 81 mg by mouth daily.    [provider]  atorvastatin  (LIPITOR ) 80 MG tablet Take 1 tablet (80 mg total) by mouth daily. Patient not taking: Reported on 02/17/2019 03/14/18   Bryn Bernardino NOVAK, MD  clopidogrel  (PLAVIX ) 75 MG tablet Take 1 tablet (75 mg total) by mouth daily. Patient not taking: Reported on 02/17/2019 03/15/18   Bryn Bernardino NOVAK, MD  divalproex  (DEPAKOTE ) 125 MG DR tablet Take 125 mg by mouth 2 (two) times daily.    [provider]  donepezil  (ARICEPT ) 10 MG tablet Take 1 tablet (10 mg total) by mouth at bedtime. 02/24/18   Ines Onetha NOVAK, MD  folic acid  (FOLVITE ) 1 MG tablet Take 1 mg by mouth daily. 04/04/19   [provider]  levothyroxine  (SYNTHROID , LEVOTHROID) 200 MCG tablet Take 200 mcg by mouth daily. **BRAND NAME ONLY** takes with 50mcg 02/21/18   [provider]  levothyroxine  (SYNTHROID , LEVOTHROID) 50 MCG tablet Take 50 mcg by mouth daily before breakfast. **BRAND NAME ONLY** takes with 200mcg    [provider]  Multiple Vitamins-Minerals (MULTIVITAMIN ADULTS) TABS Take 1 tablet by mouth daily.    [provider]  Omega-3 Fatty Acids (FISH OIL) 1000 MG CAPS Take 1,000 mg by mouth daily.    [provider]  pyridOXINE  (VITAMIN B-6) 100 MG tablet Take 100 mg by mouth daily.    [provider]  QUEtiapine  (SEROQUEL ) 100 MG tablet Take 200 mg by mouth  daily. 04/04/19   [provider]    Physical Exam: Vitals:   07/27/23 2130 07/27/23 2300 07/27/23 2330 07/28/23 0040  BP: 122/85 (!) 131/51 (!) 144/59 (!) 126/58  Pulse: 95 86 75 83  Resp: 18 16 20 18   Temp:  97.8 F (36.6 C)  98.1 F (36.7 C)  TempSrc:  Oral  Oral  SpO2: 100% 94% 100% 100%  Weight:      Height:       Constitutional: Elderly obese woman resting in bed, appears comfortable Eyes: EOMI, lids and conjunctivae normal ENMT: Mucous membranes are moist. Posterior pharynx clear of any exudate or lesions.Normal dentition.  Neck: normal, supple, no masses. Respiratory: clear to auscultation bilaterally, no wheezing, no crackles. Normal respiratory effort. No accessory muscle use.  Cardiovascular: Regular rate and rhythm, no murmurs / rubs / gallops.  Large lower extremities with lymphedematous changes Abdomen: no tenderness, no masses palpated.  Musculoskeletal: no clubbing / cyanosis. No joint deformity upper and lower extremities.  Skin: no rashes, lesions, ulcers. No induration Neurologic: Sensation intact. Strength slightly diminished lower extremities but no focal deficits Psychiatric: Alert and oriented to self only.   EKG: Personally reviewed. Sinus rhythm, rate 76, low voltage, no acute ischemic use.  Similar to previous.  Assessment/Plan Principal Problem:   Cellulitis of buttock Active Problems:   Generalized weakness   Essential hypertension   Hypothyroidism   Dementia (HCC)   Chronic kidney disease, stage 3a (HCC)   Anemia   Elevated CK   Rachael Carpenter is a 89 y.o. female with medical history significant for dementia, CKD stage IIIa, HTN, HLD, hypothyroidism, obesity who is admitted with cellulitis of the buttocks and generalized weakness.  Assessment and Plan: Cellulitis of the buttocks: Patient with some appearance of mild cellulitis of the buttocks with several excoriations/stage I pressure wounds.  WBC mildly elevated.  She was given  vancomycin  and cefepime  in the ED. -Narrow antibiotics to Ancef  -Consult to wound care  Generalized weakness/debility: Acute on chronic issue.  She has chronic edematous changes to both legs.  She has complained of some recent left ankle pain to her husband.  No obvious deformity on exam. -Obtain left ankle x-ray -PT/OT eval -Likely will need placement to SNF, [facility] to [facility]  Elevated CK: CK mildly elevated at 447.  Renal function intact, no evidence of rhabdomyolysis.  Will recheck CK level in AM.  Anemia: Hemoglobin 8.6 on admission with MCV 79.  Hemoglobin was 13.27 August 2019, no other recent labs available for comparison.  No obvious bleeding reported. -Check anemia panel  CKD stage IIIa: Renal function stable.  Delirium/encephalopathy superimposed on background of dementia: Seems to have advanced baseline dementia, some slight increasing confusion lately. -Continue delirium precautions -In the past has been on Depakote , Aricept , Seroquel  --current med rec is pending  Hypothyroidism: TSH is 13.470.  Check free T4.  Unknown if adherent to her current dosing of Synthroid , med rec pending.  Hypertension: BP is stable.   DVT prophylaxis: enoxaparin  (LOVENOX ) injection 40 mg Start: 07/28/23 1000 Code Status:   Code Status: Limited: Do not attempt resuscitation (DNR) -DNR-LIMITED -Do Not Intubate/DNI Discussed with patient's spouse by phone on admission. Family Communication: Spouse by phone  Disposition Plan: From home, dispo pending clinical progress  Consults called: None  Severity of Illness: The appropriate patient status for this patient is INPATIENT. Inpatient status is judged to be reasonable and necessary in order to provide the required intensity of service to ensure the patient's safety. The patient's presenting symptoms, physical exam findings, and initial radiographic and laboratory data in the context of their chronic comorbidities is felt to place them at high  risk for further clinical deterioration. Furthermore, it is not anticipated that the patient will be medically stable for discharge from the hospital within 2 midnights of admission.   * I certify that at the point of admission it is my clinical judgment that the patient will require inpatient hospital care spanning beyond 2 midnights from the point of admission due to high intensity of service, high risk for further deterioration and high frequency of surveillance required.DEWAINE Jorie Blanch MD [facility]  If 7PM-7AM, please contact night-coverage [email or web address]  07/28/2023, 12:53 AM

## 2023-07-27 NOTE — ED Notes (Signed)
 .ED TO INPATIENT HANDOFF REPORT  Name/Age/Gender Rachael Carpenter 89 y.o. female  Code Status Code Status History     Date Active Date Inactive Code Status Order ID Comments User Context   02/17/2019 0714 02/19/2019 1653 Full Code [id number]  Ward, Josette SAILOR, DO ED   03/05/2018 0014 03/18/2018 2031 Full Code [id number]  Rachael Evalene GORMAN, MD ED       Home/SNF/Other Home  Chief Complaint Cellulitis of buttock [L03.317]  Level of Care/Admitting Diagnosis ED Disposition     ED Disposition  Admit   Condition  --   Comment  Hospital Area: [facility] [[id number]]  Level of Care: Med-Surg [16]  May admit patient to [facility] or [facility] if equivalent level of care is available:: No  Covid Evaluation: Confirmed COVID Negative  Diagnosis: Cellulitis of buttock [[id number]]  Admitting Physician: TOBIE JORIE SAUNDERS [[id number]]  Attending Physician: TOBIE JORIE SAUNDERS [[id number]]  Certification:: I certify this patient will need inpatient services for at least 2 midnights  Expected Medical Readiness: 07/30/2023          Medical History Past Medical History:  Diagnosis Date   Acute kidney failure (HCC) 02/2018   Cerebral infarction, unspecified (HCC) 02/2018   Colon polyp    in cecum on July 2010 Colonoscopy   Dementia (HCC)    Hallucinations    Hyperlipidemia    Hypertension    Hypothyroidism    Intestinal malrotation    on CT scan 2010 in [facility]   Metabolic encephalopathy    Neck pain    L; pt denies as of 02/24/18   Suicidal ideations    Thyroid  disease    Vitamin B12 deficiency anemia     Allergies Allergies  Allergen Reactions   Penicillins Swelling    REACTION: swelling  Did it involve swelling of the face/tongue/throat, SOB, or low BP? Y Did it involve sudden or severe rash/hives, skin peeling, or any reaction on the inside of your mouth or nose? Y Did you need to seek medical attention at a hospital or doctor's office? Unknown When did it last  happen? Decades ago.       If all above answers are "NO", may proceed with cephalosporin use.     IV Location/Drains/Wounds Patient Lines/Drains/Airways Status     Active Line/Drains/Airways     Name Placement date Placement time Site Days   Peripheral IV 09/13/19 Right;Anterior;Distal Forearm 09/13/19  1633  Forearm  1413   Peripheral IV 07/27/23 18 G Left Antecubital 07/27/23  2030  Antecubital  less than 1   Peripheral IV 07/27/23 18 G Anterior;Left;Proximal Forearm 07/27/23  2331  Forearm  less than 1            Labs/Imaging Results for orders placed or performed during the hospital encounter of 07/27/23 (from the past 48 hours)  CBC with Differential     Status: Abnormal   Collection Time: 07/27/23  8:55 PM  Result Value Ref Range   WBC 11.5 (H) 4.0 - 10.5 K/uL   RBC 3.67 (L) 3.87 - 5.11 MIL/uL   Hemoglobin 8.6 (L) 12.0 - 15.0 g/dL   HCT 70.8 (L) 63.9 - 53.9 %   MCV 79.3 (L) 80.0 - 100.0 fL   MCH 23.4 (L) 26.0 - 34.0 pg   MCHC 29.6 (L) 30.0 - 36.0 g/dL   RDW 82.2 (H) 88.4 - 84.4 %   Platelets 317 150 - 400 K/uL   nRBC 0.0 0.0 - 0.2 %  Neutrophils Relative % 72 %   Neutro Abs 8.3 (H) 1.7 - 7.7 K/uL   Lymphocytes Relative 18 %   Lymphs Abs 2.0 0.7 - 4.0 K/uL   Monocytes Relative 9 %   Monocytes Absolute 1.0 0.1 - 1.0 K/uL   Eosinophils Relative 1 %   Eosinophils Absolute 0.1 0.0 - 0.5 K/uL   Basophils Relative 0 %   Basophils Absolute 0.0 0.0 - 0.1 K/uL   Immature Granulocytes 0 %   Abs Immature Granulocytes 0.04 0.00 - 0.07 K/uL    Comment: Performed at [facility], [address], [place], [place] [address]  Comprehensive metabolic panel     Status: Abnormal   Collection Time: 07/27/23  8:55 PM  Result Value Ref Range   Sodium 133 (L) 135 - 145 mmol/L   Potassium 4.2 3.5 - 5.1 mmol/L   Chloride 101 98 - 111 mmol/L   CO2 21 (L) 22 - 32 mmol/L   Glucose, Bld 107 (H) 70 - 99 mg/dL    Comment: Glucose reference range applies only to  samples taken after fasting for at least 8 hours.   BUN 23 8 - 23 mg/dL   Creatinine, Ser 8.92 (H) 0.44 - 1.00 mg/dL   Calcium  8.5 (L) 8.9 - 10.3 mg/dL   Total Protein 7.1 6.5 - 8.1 g/dL   Albumin 3.6 3.5 - 5.0 g/dL   AST 34 15 - 41 U/L   ALT 15 0 - 44 U/L   Alkaline Phosphatase 64 38 - 126 U/L   Total Bilirubin 0.6 0.0 - 1.2 mg/dL   GFR, Estimated 51 (L) >60 mL/min    Comment: (NOTE) Calculated using the CKD-EPI Creatinine Equation (2021)    Anion gap 11 5 - 15    Comment: Performed at [facility], [address] [place], [place] [address]  CK     Status: Abnormal   Collection Time: 07/27/23  8:55 PM  Result Value Ref Range   Total CK 447 (H) 38 - 234 U/L    Comment: Performed at [facility], [address] [place], [place] [address]  Resp panel by RT-PCR (RSV, Flu A&B, Covid) Anterior Nasal Swab     Status: None   Collection Time: 07/27/23  8:55 PM   Specimen: Anterior Nasal Swab  Result Value Ref Range   SARS Coronavirus 2 by RT PCR NEGATIVE NEGATIVE    Comment: (NOTE) SARS-CoV-2 target nucleic acids are NOT DETECTED.  The SARS-CoV-2 RNA is generally detectable in upper respiratory specimens during the acute phase of infection. The lowest concentration of SARS-CoV-2 viral copies this assay can detect is 138 copies/mL. A negative result does not preclude SARS-Cov-2 infection and should not be used as the sole basis for treatment or other patient management decisions. A negative result may occur with  improper specimen collection/handling, submission of specimen other than nasopharyngeal swab, presence of viral mutation(s) within the areas targeted by this assay, and inadequate number of viral copies(<138 copies/mL). A negative result must be combined with clinical observations, patient history, and epidemiological information. The expected result is Negative.  Fact Sheet for Patients:   bloggercourse.com  Fact Sheet for Healthcare Providers:  seriousbroker.it  This test is no t yet approved or cleared by the [place]  FDA and  has been authorized for detection and/or diagnosis of SARS-CoV-2 by FDA under an Emergency Use Authorization (EUA). This EUA will remain  in effect (meaning this test can be used) for the  duration of the COVID-19 declaration under Section 564(b)(1) of the Act, 21 U.S.C.section 360bbb-3(b)(1), unless the authorization is terminated  or revoked sooner.       Influenza A by PCR NEGATIVE NEGATIVE   Influenza B by PCR NEGATIVE NEGATIVE    Comment: (NOTE) The Xpert Xpress SARS-CoV-2/FLU/RSV plus assay is intended as an aid in the diagnosis of influenza from Nasopharyngeal swab specimens and should not be used as a sole basis for treatment. Nasal washings and aspirates are unacceptable for Xpert Xpress SARS-CoV-2/FLU/RSV testing.  Fact Sheet for Patients: bloggercourse.com  Fact Sheet for Healthcare Providers: seriousbroker.it  This test is not yet approved or cleared by the [place]  FDA and has been authorized for detection and/or diagnosis of SARS-CoV-2 by FDA under an Emergency Use Authorization (EUA). This EUA will remain in effect (meaning this test can be used) for the duration of the COVID-19 declaration under Section 564(b)(1) of the Act, 21 U.S.C. section 360bbb-3(b)(1), unless the authorization is terminated or revoked.     Resp Syncytial Virus by PCR NEGATIVE NEGATIVE    Comment: (NOTE) Fact Sheet for Patients: bloggercourse.com  Fact Sheet for Healthcare Providers: seriousbroker.it  This test is not yet approved or cleared by the [place]  FDA and has been authorized for detection and/or diagnosis of SARS-CoV-2 by FDA under an Emergency Use Authorization (EUA).  This EUA will remain in effect (meaning this test can be used) for the duration of the COVID-19 declaration under Section 564(b)(1) of the Act, 21 U.S.C. section 360bbb-3(b)(1), unless the authorization is terminated or revoked.  Performed at [facility], [address] [place], [place] [address]   TSH     Status: Abnormal   Collection Time: 07/27/23  8:55 PM  Result Value Ref Range   TSH 13.470 (H) 0.350 - 4.500 uIU/mL    Comment: Performed by a 3rd Generation assay with a functional sensitivity of <=0.01 uIU/mL. Performed at [facility], [address], [place], [place] [address]   Urinalysis, w/ Reflex to Culture (Infection Suspected) -Urine, Clean Catch     Status: None   Collection Time: 07/27/23  9:45 PM  Result Value Ref Range   Specimen Source URINE, CLEAN CATCH    Color, Urine YELLOW YELLOW   APPearance CLEAR CLEAR   Specific Gravity, Urine 1.028 1.005 - 1.030   pH 5.0 5.0 - 8.0   Glucose, UA NEGATIVE NEGATIVE mg/dL   Hgb urine dipstick NEGATIVE NEGATIVE   Bilirubin Urine NEGATIVE NEGATIVE   Ketones, ur NEGATIVE NEGATIVE mg/dL   Protein, ur NEGATIVE NEGATIVE mg/dL   Nitrite NEGATIVE NEGATIVE   Leukocytes,Ua NEGATIVE NEGATIVE   RBC / HPF 0-5 0 - 5 RBC/hpf   WBC, UA 0-5 0 - 5 WBC/hpf    Comment:        Reflex urine culture not performed if WBC <=10, OR if Squamous epithelial cells >5. If Squamous epithelial cells >5 suggest recollection.    Bacteria, UA NONE SEEN NONE SEEN   Squamous Epithelial / HPF 0-5 0 - 5 /HPF   Mucus PRESENT     Comment: Performed at [facility], [address], [place], [place] [address]  I-Stat CG4 Lactic Acid     Status: None   Collection Time: 07/27/23 10:16 PM  Result Value Ref Range   Lactic Acid, Venous 1.7 0.5 - 1.9 mmol/L   CT HEAD WO CONTRAST ( ) Result Date: 07/27/2023 CLINICAL DATA:  Mental status change EXAM: CT HEAD WITHOUT CONTRAST  TECHNIQUE: Contiguous axial  images were obtained from the base of the skull through the vertex without intravenous contrast. RADIATION DOSE REDUCTION: This exam was performed according to the departmental dose-optimization program which includes automated exposure control, adjustment of the mA and/or kV according to patient size and/or use of iterative reconstruction technique. COMPARISON:  MRI brain 03/05/2018. FINDINGS: Brain: No evidence of acute infarction, hemorrhage, hydrocephalus, extra-axial collection or mass lesion/mass effect. There is mild diffuse atrophy and moderate periventricular white matter hypodensity, similar to the prior study. Vascular: Atherosclerotic calcifications are present within the cavernous internal carotid arteries. Skull: Normal. Negative for fracture or focal lesion. Sinuses/Orbits: No acute finding. Other: None. IMPRESSION: 1. No acute intracranial process. 2. Mild diffuse atrophy and moderate chronic small vessel ischemic changes. Electronically Signed   By: Greig Pique M.D.   On: 07/27/2023 22:28   DG Chest Port 1 View Result Date: 07/27/2023 CLINICAL DATA:  Fall injury.  Chronic kidney disease. EXAM: PORTABLE PELVIS 1-2 VIEWS; PORTABLE CHEST - 1 VIEW COMPARISON:  Portable chest 02/17/2019, flat plate abdomen view including the pelvis 02/20/2018. FINDINGS: Chest AP portable 8:44 p.m.: Mild cardiomegaly. No evidence of CHF. There is mild aortic tortuosity with stable mediastinum. The lungs are clear. There is no measurable pleural effusion or pneumothorax. Osteopenia and thoracic spondylosis. No acute regional skeletal findings. Portable AP pelvis single view: Mild osteopenia. No evidence of pelvic fractures or diastasis. No AP evidence of fractures of the visualized proximal femurs. There is asymmetric moderate right hip arthrosis, mild degenerative change left hip, spurring of the symphysis pubis and SI joints. There is facet hypertrophy in the visualized lower lumbar spine. Soft tissues are  unremarkable. IMPRESSION: 1. No evidence of acute chest disease. Mild cardiomegaly. 2. No evidence of pelvic fractures or diastasis. 3. Degenerative changes. Electronically Signed   By: Francis Quam M.D.   On: 07/27/2023 21:14   DG Pelvis Portable Result Date: 07/27/2023 CLINICAL DATA:  Fall injury.  Chronic kidney disease. EXAM: PORTABLE PELVIS 1-2 VIEWS; PORTABLE CHEST - 1 VIEW COMPARISON:  Portable chest 02/17/2019, flat plate abdomen view including the pelvis 02/20/2018. FINDINGS: Chest AP portable 8:44 p.m.: Mild cardiomegaly. No evidence of CHF. There is mild aortic tortuosity with stable mediastinum. The lungs are clear. There is no measurable pleural effusion or pneumothorax. Osteopenia and thoracic spondylosis. No acute regional skeletal findings. Portable AP pelvis single view: Mild osteopenia. No evidence of pelvic fractures or diastasis. No AP evidence of fractures of the visualized proximal femurs. There is asymmetric moderate right hip arthrosis, mild degenerative change left hip, spurring of the symphysis pubis and SI joints. There is facet hypertrophy in the visualized lower lumbar spine. Soft tissues are unremarkable. IMPRESSION: 1. No evidence of acute chest disease. Mild cardiomegaly. 2. No evidence of pelvic fractures or diastasis. 3. Degenerative changes. Electronically Signed   By: Francis Quam M.D.   On: 07/27/2023 21:14    Pending Labs Unresulted Labs (From admission, onward)     Start     Ordered   07/27/23 2132  Blood culture (routine x 2)  BLOOD CULTURE X 2,   R (with STAT occurrences)      07/27/23 2131   07/27/23 2047  Valproic acid  level  Once,   STAT        07/27/23 2046            Vitals/Pain Today's Vitals   07/27/23 2037 07/27/23 2100 07/27/23 2130 07/27/23 2300  BP:  (!) 149/58 122/85 (!) 131/51  Pulse:  81 95 86  Resp:  19 18 16   Temp: 99 F (37.2 C)   97.8 F (36.6 C)  TempSrc: Rectal   Oral  SpO2:  100% 100% 94%  Weight:      Height:       PainSc:        Isolation Precautions No active isolations  Medications Medications  vancomycin  (VANCOCIN ) IVPB 1000 mg/200 mL premix (1,000 mg Intravenous New Bag/Given 07/27/23 2330)  ceFEPIme  (MAXIPIME ) 2 g in sodium chloride  0.9 % 100 mL IVPB (0 g Intravenous Stopped 07/27/23 2332)    Mobility non-ambulatory

## 2023-07-27 NOTE — ED Provider Notes (Signed)
 [place] EMERGENCY DEPARTMENT AT [facility] Provider Note   CSN: [id number] Arrival date & time: 07/27/23  2007     History  Chief Complaint  Patient presents with   Weakness    89 y/o female comes in from home by EMS after her husband called 911 concerned for pts decrease in mobility and BLE swelling. Per EMS, pt was laying on the couch in her own feces and urine. On arrival, pt is alert oriented to self, but disoriented to time and situation. Pt is malodorous with soiled clothing, BLE edema without pitting that radiates up to her BLE knees. PT also has multiple open areas on her buttock and BLE posterior thighs. RN also notes audile expiratory wheezes.   Abscess    DEBORAHANN POTEAT is a 89 y.o. female history of dementia, hypertension, hypothyroidism here presenting with altered mental status and falling.  Patient lives at home with her husband.  Per the daughter who visits them, patient has been more and more confused recently.  Patient has been falling a lot over the last week.  Patient also got progressively weaker to the point that she is no longer able to walk by herself.  Per the daughter, patient's husband is unable to pick her up off the recliner for the last several days.  Consequently she is urinating on herself and also defecating on herself.  [place] any [place] to me.  Per the daughter, patient does have a visiting nurse that comes and visits occasionally.  However she felt that they could no longer care for her at home and she will need to be in a nursing home.  The history is provided by the patient and a relative.       Home Medications Prior to Admission medications   Medication Sig Start Date End Date Taking? Authorizing Provider  amLODipine  (NORVASC ) 2.5 MG tablet Take 2.5 mg by mouth daily.    [provider]  aspirin  EC 81 MG tablet Take 81 mg by mouth daily.    [provider]   atorvastatin  (LIPITOR ) 80 MG tablet Take 1 tablet (80 mg total) by mouth daily. Patient not taking: Reported on 02/17/2019 03/14/18   Bryn Bernardino NOVAK, MD  clopidogrel  (PLAVIX ) 75 MG tablet Take 1 tablet (75 mg total) by mouth daily. Patient not taking: Reported on 02/17/2019 03/15/18   Bryn Bernardino NOVAK, MD  divalproex  (DEPAKOTE ) 125 MG DR tablet Take 125 mg by mouth 2 (two) times daily.    [provider]  donepezil  (ARICEPT ) 10 MG tablet Take 1 tablet (10 mg total) by mouth at bedtime. 02/24/18   Ines Onetha NOVAK, MD  folic acid  (FOLVITE ) 1 MG tablet Take 1 mg by mouth daily. 04/04/19   [provider]  levothyroxine  (SYNTHROID , LEVOTHROID) 200 MCG tablet Take 200 mcg by mouth daily. **BRAND NAME ONLY** takes with 50mcg 02/21/18   [provider]  levothyroxine  (SYNTHROID , LEVOTHROID) 50 MCG tablet Take 50 mcg by mouth daily before breakfast. **BRAND NAME ONLY** takes with 200mcg    [provider]  Multiple Vitamins-Minerals (MULTIVITAMIN ADULTS) TABS Take 1 tablet by mouth daily.    [provider]  Omega-3 Fatty Acids (FISH OIL) 1000 MG CAPS Take 1,000 mg by mouth daily.    [provider]  pyridOXINE  (VITAMIN B-6) 100 MG tablet Take 100 mg by mouth daily.    [provider]  QUEtiapine  (SEROQUEL ) 100  MG tablet Take 200 mg by mouth daily. 04/04/19   [provider]      Allergies    Penicillins    Review of Systems   Review of Systems  Neurological:  Positive for weakness.  All other systems reviewed and are negative.   Physical Exam Updated Vital Signs BP 122/85   Pulse 95   Temp 99 F (37.2 C) (Rectal)   Resp 18   Ht 5' 7 (1.702 m)   Wt 85 kg   SpO2 100%   BMI 29.35 kg/m  Physical Exam Vitals and nursing note reviewed.  Constitutional:      Comments: Altered and confused  HENT:     Head: Normocephalic.     Mouth/Throat:     Mouth: Mucous membranes are dry.  Eyes:     Extraocular Movements: Extraocular  movements intact.     Pupils: Pupils are equal, round, and reactive to light.  Cardiovascular:     Rate and Rhythm: Normal rate and regular rhythm.  Pulmonary:     Effort: Pulmonary effort is normal.     Breath sounds: Normal breath sounds.  Abdominal:     General: Abdomen is flat.     Palpations: Abdomen is soft.  Musculoskeletal:     Cervical back: Normal range of motion and neck supple.     Comments: Patient's legs are covered with stool.  Patient also has erythema behind bilateral thighs consistent with mild cellulitis  Skin:    Capillary Refill: Capillary refill takes less than 2 seconds.  Neurological:     Comments: Demented but moving all extremities  Psychiatric:     Comments: Unable     ED Results / Procedures / Treatments   Labs (all labs ordered are listed, but only abnormal results are displayed) Labs Reviewed  CBC WITH DIFFERENTIAL/PLATELET - Abnormal; Notable for the following components:      Result Value   WBC 11.5 (*)    RBC 3.67 (*)    Hemoglobin 8.6 (*)    HCT 29.1 (*)    MCV 79.3 (*)    MCH 23.4 (*)    MCHC 29.6 (*)    RDW 17.7 (*)    Neutro Abs 8.3 (*)    All other components within normal limits  COMPREHENSIVE METABOLIC PANEL - Abnormal; Notable for the following components:   Sodium 133 (*)    CO2 21 (*)    Glucose, Bld 107 (*)    Creatinine, Ser 1.07 (*)    Calcium  8.5 (*)    GFR, Estimated 51 (*)    All other components within normal limits  CK - Abnormal; Notable for the following components:   Total CK 447 (*)    All other components within normal limits  TSH - Abnormal; Notable for the following components:   TSH 13.470 (*)    All other components within normal limits  RESP PANEL BY RT-PCR (RSV, FLU A&B, COVID)  RVPGX2  CULTURE, BLOOD (ROUTINE X 2)  CULTURE, BLOOD (ROUTINE X 2)  URINALYSIS, W/ REFLEX TO CULTURE (INFECTION SUSPECTED)  VALPROIC ACID  LEVEL  I-STAT CG4 LACTIC ACID, ED  I-STAT CG4 LACTIC ACID, ED    EKG EKG  Interpretation Date/Time:  Tuesday July 27 2023 20:41:09 EST Ventricular Rate:  76 PR Interval:    QRS Duration:  76 QT Interval:  390 QTC Calculation: 439 R Axis:   4  Text Interpretation: Normal sinus rhythm Low voltage, precordial leads Abnormal R-wave progression, late transition Borderline  abnrm T, anterolateral leads No significant change since last tracing Confirmed by Patt Alm DEL [phone]) on 07/27/2023 8:43:18 PM  Radiology CT HEAD WO CONTRAST ( ) Result Date: 07/27/2023 CLINICAL DATA:  Mental status change EXAM: CT HEAD WITHOUT CONTRAST TECHNIQUE: Contiguous axial images were obtained from the base of the skull through the vertex without intravenous contrast. RADIATION DOSE REDUCTION: This exam was performed according to the departmental dose-optimization program which includes automated exposure control, adjustment of the mA and/or kV according to patient size and/or use of iterative reconstruction technique. COMPARISON:  MRI brain 03/05/2018. FINDINGS: Brain: No evidence of acute infarction, hemorrhage, hydrocephalus, extra-axial collection or mass lesion/mass effect. There is mild diffuse atrophy and moderate periventricular white matter hypodensity, similar to the prior study. Vascular: Atherosclerotic calcifications are present within the cavernous internal carotid arteries. Skull: Normal. Negative for fracture or focal lesion. Sinuses/Orbits: No acute finding. Other: None. IMPRESSION: 1. No acute intracranial process. 2. Mild diffuse atrophy and moderate chronic small vessel ischemic changes. Electronically Signed   By: Greig Pique M.D.   On: 07/27/2023 22:28   DG Chest Port 1 View Result Date: 07/27/2023 CLINICAL DATA:  Fall injury.  Chronic kidney disease. EXAM: PORTABLE PELVIS 1-2 VIEWS; PORTABLE CHEST - 1 VIEW COMPARISON:  Portable chest 02/17/2019, flat plate abdomen view including the pelvis 02/20/2018. FINDINGS: Chest AP portable 8:44 p.m.: Mild cardiomegaly. No evidence of  CHF. There is mild aortic tortuosity with stable mediastinum. The lungs are clear. There is no measurable pleural effusion or pneumothorax. Osteopenia and thoracic spondylosis. No acute regional skeletal findings. Portable AP pelvis single view: Mild osteopenia. No evidence of pelvic fractures or diastasis. No AP evidence of fractures of the visualized proximal femurs. There is asymmetric moderate right hip arthrosis, mild degenerative change left hip, spurring of the symphysis pubis and SI joints. There is facet hypertrophy in the visualized lower lumbar spine. Soft tissues are unremarkable. IMPRESSION: 1. No evidence of acute chest disease. Mild cardiomegaly. 2. No evidence of pelvic fractures or diastasis. 3. Degenerative changes. Electronically Signed   By: Francis Quam M.D.   On: 07/27/2023 21:14   DG Pelvis Portable Result Date: 07/27/2023 CLINICAL DATA:  Fall injury.  Chronic kidney disease. EXAM: PORTABLE PELVIS 1-2 VIEWS; PORTABLE CHEST - 1 VIEW COMPARISON:  Portable chest 02/17/2019, flat plate abdomen view including the pelvis 02/20/2018. FINDINGS: Chest AP portable 8:44 p.m.: Mild cardiomegaly. No evidence of CHF. There is mild aortic tortuosity with stable mediastinum. The lungs are clear. There is no measurable pleural effusion or pneumothorax. Osteopenia and thoracic spondylosis. No acute regional skeletal findings. Portable AP pelvis single view: Mild osteopenia. No evidence of pelvic fractures or diastasis. No AP evidence of fractures of the visualized proximal femurs. There is asymmetric moderate right hip arthrosis, mild degenerative change left hip, spurring of the symphysis pubis and SI joints. There is facet hypertrophy in the visualized lower lumbar spine. Soft tissues are unremarkable. IMPRESSION: 1. No evidence of acute chest disease. Mild cardiomegaly. 2. No evidence of pelvic fractures or diastasis. 3. Degenerative changes. Electronically Signed   By: Francis Quam M.D.   On:  07/27/2023 21:14    Procedures Procedures    Medications Ordered in ED Medications  vancomycin  (VANCOCIN ) IVPB 1000 mg/200 mL premix (has no administration in time range)  ceFEPIme  (MAXIPIME ) 2 g in sodium chloride  0.9 % 100 mL IVPB (2 g Intravenous New Bag/Given 07/27/23 2240)    ED Course/ Medical Decision Making/ A&P  Medical Decision Making CASSIOPEIA FLORENTINO is a 89 y.o. female here presenting with altered mental status and frequent falls.  Patient is unable to get up and now has wounds behind bilateral thighs.  Concern for possible cellulitis.  Will do sepsis workup with CBC CMP and lactate and cultures and UA and urine culture.  Will also get chest x-ray and pelvis x-ray.  Will get CT head to rule out bleed.  11:12 PM White blood cell count is 11.  UA unremarkable.  CK level is 447.  CT head showed no bleed.  Patient given antibiotics for cellulitis.  At this point patient will need PT OT and admission for wound care.  Problems Addressed: Cellulitis of lower extremity, unspecified laterality: acute illness or injury Severe dementia with other behavioral disturbance, unspecified dementia type [facility]): chronic illness or injury with exacerbation, progression, or side effects of treatment  Amount and/or Complexity of Data Reviewed Labs: ordered. Decision-making details documented in ED Course. Radiology: ordered and independent interpretation performed. Decision-making details documented in ED Course. ECG/medicine tests: ordered and independent interpretation performed. Decision-making details documented in ED Course.  Risk Prescription drug management. Decision regarding hospitalization.    Final Clinical Impression(s) / ED Diagnoses Final diagnoses:  None    Rx / DC Orders ED Discharge Orders     None         Patt Alm Macho, MD 07/27/23 2313

## 2023-07-28 ENCOUNTER — Inpatient Hospital Stay (HOSPITAL_COMMUNITY): Payer: Medicare Other

## 2023-07-28 DIAGNOSIS — L03317 Cellulitis of buttock: Secondary | ICD-10-CM | POA: Diagnosis not present

## 2023-07-28 LAB — BASIC METABOLIC PANEL
Anion gap: 11 (ref 5–15)
BUN: 20 mg/dL (ref 8–23)
CO2: 21 mmol/L — ABNORMAL LOW (ref 22–32)
Calcium: 8.1 mg/dL — ABNORMAL LOW (ref 8.9–10.3)
Chloride: 98 mmol/L (ref 98–111)
Creatinine, Ser: 0.93 mg/dL (ref 0.44–1.00)
GFR, Estimated: 60 mL/min — ABNORMAL LOW (ref >60)
Glucose, Bld: 96 mg/dL (ref 70–99)
Potassium: 4.1 mmol/L (ref 3.5–5.1)
Sodium: 130 mmol/L — ABNORMAL LOW (ref 135–145)

## 2023-07-28 LAB — IRON AND TIBC
Iron: 29 ug/dL (ref 28–170)
Saturation Ratios: 6 % — ABNORMAL LOW (ref 10.4–31.8)
TIBC: 486 ug/dL — ABNORMAL HIGH (ref 250–450)
UIBC: 457 ug/dL

## 2023-07-28 LAB — FOLATE: Folate: 21.8 ng/mL (ref >5.9)

## 2023-07-28 LAB — CBC
HCT: 28 % — ABNORMAL LOW (ref 36.0–46.0)
Hemoglobin: 8.2 g/dL — ABNORMAL LOW (ref 12.0–15.0)
MCH: 23.4 pg — ABNORMAL LOW (ref 26.0–34.0)
MCHC: 29.3 g/dL — ABNORMAL LOW (ref 30.0–36.0)
MCV: 79.8 fL — ABNORMAL LOW (ref 80.0–100.0)
Platelets: 292 10*3/uL (ref 150–400)
RBC: 3.51 MIL/uL — ABNORMAL LOW (ref 3.87–5.11)
RDW: 17.7 % — ABNORMAL HIGH (ref 11.5–15.5)
WBC: 10.8 10*3/uL — ABNORMAL HIGH (ref 4.0–10.5)
nRBC: 0 % (ref 0.0–0.2)

## 2023-07-28 LAB — VALPROIC ACID LEVEL: Valproic Acid Lvl: <10 ug/mL — ABNORMAL LOW (ref 50.0–100.0)

## 2023-07-28 LAB — FERRITIN: Ferritin: 7 ng/mL — ABNORMAL LOW (ref 11–307)

## 2023-07-28 LAB — CK: Total CK: 632 U/L — ABNORMAL HIGH (ref 38–234)

## 2023-07-28 LAB — VITAMIN B12: Vitamin B-12: 181 pg/mL (ref 180–914)

## 2023-07-28 LAB — T4, FREE: Free T4: 0.94 ng/dL (ref 0.61–1.12)

## 2023-07-28 MED ORDER — OMEGA-3-ACID ETHYL ESTERS 1 G PO CAPS: 1.0000 g | ORAL_CAPSULE | Freq: Every day | ORAL | Status: DC

## 2023-07-28 MED ORDER — QUETIAPINE FUMARATE 50 MG PO TABS
200.0000 mg | ORAL_TABLET | Freq: Every day | ORAL | Status: DC
  Administered 2023-07-28 – 2023-07-30 (×3): 200 mg via ORAL
  Filled 2023-07-28 (×3): qty 4

## 2023-07-28 MED ORDER — ADULT MULTIVITAMIN W/MINERALS CH
1.0000 | ORAL_TABLET | Freq: Every day | ORAL | Status: DC
  Administered 2023-07-29 – 2023-07-31 (×3): 1 via ORAL
  Filled 2023-07-28 (×3): qty 1

## 2023-07-28 MED ORDER — LEVOTHYROXINE SODIUM 100 MCG PO TABS
150.0000 ug | ORAL_TABLET | Freq: Every day | ORAL | Status: DC
  Administered 2023-07-28 – 2023-07-31 (×4): 150 ug via ORAL
  Filled 2023-07-28 (×4): qty 2

## 2023-07-28 MED ORDER — FOLIC ACID 1 MG PO TABS
1.0000 mg | ORAL_TABLET | Freq: Every day | ORAL | Status: DC
  Administered 2023-07-28 – 2023-07-31 (×4): 1 mg via ORAL
  Filled 2023-07-28 (×4): qty 1

## 2023-07-28 MED ORDER — VITAMIN B-6 100 MG PO TABS
100.0000 mg | ORAL_TABLET | Freq: Every day | ORAL | Status: DC
Start: 2023-07-29 — End: 2023-08-01
  Administered 2023-07-29 – 2023-07-31 (×3): 100 mg via ORAL
  Filled 2023-07-28 (×3): qty 1

## 2023-07-28 MED ORDER — LEVOTHYROXINE SODIUM 50 MCG PO TABS: 50.0000 ug | ORAL_TABLET | Freq: Every day | ORAL | Status: DC

## 2023-07-28 MED ORDER — ASPIRIN 81 MG PO TBEC
81.0000 mg | DELAYED_RELEASE_TABLET | Freq: Every day | ORAL | Status: DC
  Administered 2023-07-28 – 2023-07-31 (×4): 81 mg via ORAL
  Filled 2023-07-28 (×4): qty 1

## 2023-07-28 MED ORDER — VITAMIN B-12 1000 MCG PO TABS
1000.0000 ug | ORAL_TABLET | Freq: Every day | ORAL | Status: DC
  Administered 2023-07-28 – 2023-07-31 (×4): 1000 ug via ORAL
  Filled 2023-07-28 (×4): qty 1

## 2023-07-28 MED ORDER — GERHARDT'S BUTT CREAM
TOPICAL_CREAM | Freq: Three times a day (TID) | CUTANEOUS | Status: DC
  Administered 2023-07-29: 1 via TOPICAL
  Filled 2023-07-28 (×2): qty 60

## 2023-07-28 MED ORDER — FERROUS SULFATE 325 (65 FE) MG PO TABS
325.0000 mg | ORAL_TABLET | Freq: Every day | ORAL | Status: DC
  Administered 2023-07-29 – 2023-07-31 (×3): 325 mg via ORAL
  Filled 2023-07-28 (×3): qty 1

## 2023-07-28 MED ORDER — DIVALPROEX SODIUM 125 MG PO DR TAB
125.0000 mg | DELAYED_RELEASE_TABLET | Freq: Every day | ORAL | Status: DC
  Administered 2023-07-28 – 2023-07-31 (×4): 125 mg via ORAL
  Filled 2023-07-28 (×5): qty 1

## 2023-07-28 MED ORDER — AMLODIPINE BESYLATE 5 MG PO TABS: 2.5000 mg | ORAL_TABLET | Freq: Every day | ORAL | Status: DC

## 2023-07-28 MED ORDER — AMLODIPINE BESYLATE 5 MG PO TABS
5.0000 mg | ORAL_TABLET | Freq: Every day | ORAL | Status: DC
  Administered 2023-07-28 – 2023-07-31 (×4): 5 mg via ORAL
  Filled 2023-07-28 (×4): qty 1

## 2023-07-28 MED ORDER — DONEPEZIL HCL 10 MG PO TABS
10.0000 mg | ORAL_TABLET | Freq: Every day | ORAL | Status: DC
  Administered 2023-07-28 – 2023-07-30 (×3): 10 mg via ORAL
  Filled 2023-07-28 (×3): qty 1

## 2023-07-28 MED ORDER — METOPROLOL SUCCINATE ER 50 MG PO TB24
50.0000 mg | ORAL_TABLET | Freq: Every day | ORAL | Status: DC
  Administered 2023-07-28 – 2023-07-31 (×4): 50 mg via ORAL
  Filled 2023-07-28 (×4): qty 1

## 2023-07-28 NOTE — Plan of Care (Signed)
   Problem: Activity: Goal: Risk for activity intolerance will decrease Outcome: Progressing   Problem: Nutrition: Goal: Adequate nutrition will be maintained Outcome: Progressing   Problem: Safety: Goal: Ability to remain free from injury will improve Outcome: Progressing   Problem: Skin Integrity: Goal: Risk for impaired skin integrity will decrease Outcome: Progressing

## 2023-07-28 NOTE — Evaluation (Signed)
 Occupational Therapy Evaluation Patient Details Name: Rachael Carpenter MRN: [id number] DOB: [birth year] Today's Date: 07/28/2023   History of Present Illness 89 yo female who presented from home with weakness, increased  confusion from baseline; admitted with cellulitis of the buttocks and generalized weakness. PMH: dementia, CKD stage IIIa, HTN, HLD, hypothyroidism, obesity, chronic edema LEs   Clinical Impression   Pt presents with decline in function and safety with ADLs and ADL mobility with impaired strength, balance, endurance and cognition (hx of advanced dementia). PTA pt lived at home with her husband and was Ind with ADLs/selfcare, simple cooking and mobility. Pt currently requires mod A with LB ADLs, mod A with toileting, CGA with mobility using RW with multimodal cues for safety and sequencing. Pt would benefit from acute OT services to maximize level of function and safety      If plan is discharge home, recommend the following: A lot of help with bathing/dressing/bathroom;A little help with walking and/or transfers;Direct supervision/assist for medications management;Assist for transportation;Supervision due to cognitive status;Direct supervision/assist for financial management    Functional Status Assessment  Patient has had a recent decline in their functional status and demonstrates the ability to make significant improvements in function in a reasonable and predictable amount of time.  Equipment Recommendations  Other (comment) (TBD at next venue of care)    Recommendations for Other Services       Precautions / Restrictions Precautions Precautions: Fall Precaution Comments: (P) has had falls last few months Restrictions Weight Bearing Restrictions Per Provider Order: No      Mobility Bed Mobility               General bed mobility comments: pt in recliner on arrival    Transfers Overall transfer level: Needs assistance Equipment used: Rolling walker (2  wheels) Transfers: Sit to/from Stand Sit to Stand: Contact guard assist           General transfer comment: for safety, cues for hand placement and to control descent      Balance Overall balance assessment: Needs assistance Sitting-balance support: Feet supported, No upper extremity supported Sitting balance-Leahy Scale: Good     Standing balance support: Reliant on assistive device for balance, During functional activity Standing balance-Leahy Scale: Fair                             ADL either performed or assessed with clinical judgement   ADL Overall ADL's : Needs assistance/impaired Eating/Feeding: Independent;Sitting   Grooming: Wash/dry hands;Wash/dry face;Contact guard assist;Standing   Upper Body Bathing: Contact guard assist;Sitting   Lower Body Bathing: Moderate assistance   Upper Body Dressing : Contact guard assist;Sitting   Lower Body Dressing: Moderate assistance   Toilet Transfer: Contact guard assist   Toileting- Clothing Manipulation and Hygiene: Moderate assistance;Sit to/from stand       Functional mobility during ADLs: Contact guard assist;Rolling walker (2 wheels);Cueing for safety;Cueing for sequencing       Vision Ability to See in Adequate Light: 0 Adequate Patient Visual Report: No change from baseline       Perception         Praxis         Pertinent Vitals/Pain Pain Assessment Pain Assessment: No/denies pain     Extremity/Trunk Assessment Upper Extremity Assessment Upper Extremity Assessment: Generalized weakness   Lower Extremity Assessment Lower Extremity Assessment: Defer to PT evaluation       Communication Communication Communication: No  apparent difficulties Cueing Techniques: Verbal cues;Gestural cues;Tactile cues   Cognition Arousal: Alert Behavior During Therapy: WFL for tasks assessed/performed Overall Cognitive Status: History of cognitive impairments - at baseline Area of Impairment:  Following commands, Safety/judgement, Memory                     Memory: Decreased short-term memory Following Commands: Follows one step commands consistently Safety/Judgement: Decreased awareness of safety, Decreased awareness of deficits     General Comments: pt grandson present for session, tells PT that pt and spouse (his grandparents) recently lost his mother her sister around [place].     General Comments       Exercises     Shoulder Instructions      Home Living Family/patient expects to be discharged to:: Private residence Living Arrangements: Spouse/significant other Available Help at Discharge: Family Type of Home: House Home Access: Stairs to enter [employer] of Steps: 1   Home Layout: One level     Bathroom Shower/Tub: Tub/shower unit;Walk-in shower   Bathroom Toilet: Standard     Home Equipment: [employer] (2 wheels);Cane - single point   Additional Comments: recent falls per pt grandson      Prior Functioning/Environment Prior Level of Function : History of Falls (last six months)             Mobility Comments: pt reports independence, does not typically ambulate with device. pt grandson confirms ADLs Comments: Ind with ADLs/selfcare simple cooking        OT Problem List: Decreased strength;Impaired balance (sitting and/or standing);Decreased cognition;Decreased safety awareness;Decreased activity tolerance;Decreased coordination;Decreased knowledge of use of DME or AE      OT Treatment/Interventions: Self-care/ADL training;DME and/or AE instruction;Therapeutic activities;Balance training;Patient/family education    OT Goals(Current goals can be found in the care plan section) Acute Rehab OT Goals Patient Stated Goal: none stated OT Goal Formulation: With patient/family Time For Goal Achievement: 08/11/23 Potential to Achieve Goals: Good ADL Goals Pt Will Perform Grooming: with supervision;standing Pt Will  Perform Upper Body Bathing: with supervision;sitting Pt Will Perform Lower Body Bathing: with min assist Pt Will Perform Upper Body Dressing: with supervision;sitting Pt Will Transfer to Toilet: with supervision;ambulating Pt Will Perform Toileting - Clothing Manipulation and hygiene: with min assist;sit to/from stand  OT Frequency: Min 1X/week    Co-evaluation PT/OT/SLP Co-Evaluation/Treatment: Yes Reason for Co-Treatment: To address functional/ADL transfers PT goals addressed during session: Mobility/safety with mobility;Proper use of DME OT goals addressed during session: ADL's and self-care;Proper use of Adaptive equipment and DME      AM-PAC OT 6 Clicks Daily Activity     Outcome Measure Help from another person eating meals?: None Help from another person taking care of personal grooming?: A Little Help from another person toileting, which includes using toliet, bedpan, or urinal?: A Lot Help from another person bathing (including washing, rinsing, drying)?: A Lot Help from another person to put on and taking off regular upper body clothing?: A Little Help from another person to put on and taking off regular lower body clothing?: A Lot 6 Click Score: 16   End of Session Equipment Utilized During Treatment: Gait belt;Rolling walker (2 wheels)  Activity Tolerance: Patient tolerated treatment well Patient left: in chair;with call bell/phone within reach;with family/visitor present  OT Visit Diagnosis: Other abnormalities of gait and mobility (R26.89);Muscle weakness (generalized) (M62.81)                Time: 8846-8782 OT Time Calculation (min): 24 min  Charges:  OT General Charges $OT Visit: 1 Visit OT Evaluation $OT Eval Moderate Complexity: 1 Mod OT Treatments $Therapeutic Activity: 8-22 mins    Jacques Karna Loose 07/28/2023, 1:36 PM

## 2023-07-28 NOTE — Evaluation (Signed)
 Physical Therapy Evaluation Patient Details Name: Rachael Carpenter MRN: [id number] DOB: Mar 04, 1935 Today's Date: 07/28/2023  History of Present Illness  89 yo female who presented from home with weakness, increased  confusion from baseline; admitted with cellulitis of the buttocks and generalized weakness. PMH: dementia, CKD stage IIIa, HTN, HLD, hypothyroidism, obesity, chronic edema LEs  Clinical Impression  Pt admitted with above diagnosis.  Pt agreeable to PT. Pt grandson present for session and confirms pt typically ambulates without device, endorses some recent falls. Recently pt has been requiring more assistance which pt spouse is unable to provide.  Pt fatigues easily this session, dyspneic after short distance amb  with SpO2=87% on RA with initial check, incr to 90s with seated rest on RA.   Pt currently with functional limitations due to the deficits listed below (see PT Problem List). Pt will benefit from acute skilled PT to increase their independence and safety with mobility to allow discharge.           If plan is discharge home, recommend the following: A little help with walking and/or transfers;A little help with bathing/dressing/bathroom;Assistance with cooking/housework;Assist for transportation;Help with stairs or ramp for entrance;Supervision due to cognitive status;Direct supervision/assist for medications management   Can travel by private vehicle   Yes    Equipment Recommendations None recommended by PT  Recommendations for Other Services       Functional Status Assessment Patient has had a recent decline in their functional status and demonstrates the ability to make significant improvements in function in a reasonable and predictable amount of time.     Precautions / Restrictions Precautions Precautions: Fall Precaution Comments: (P) has had falls last few months Restrictions Weight Bearing Restrictions Per Provider Order: No      Mobility  Bed  Mobility               General bed mobility comments: pt in recliner on arrival    Transfers Overall transfer level: Needs assistance Equipment used: Rolling walker (2 wheels) Transfers: Sit to/from Stand Sit to Stand: Supervision, Contact guard assist           General transfer comment: for safety, cues for hand placement and to control descent    Ambulation/Gait Ambulation/Gait assistance: Min assist Gait Distance (Feet): 20 Feet (15' more) Assistive device: Rolling walker (2 wheels) Gait Pattern/deviations: Step-through pattern, Decreased stride length, Trunk flexed Gait velocity: decr     General Gait Details: multi-modal repetitious cues for proper RW use, trunk/hip extension. fatigues easily. pt  dyspneic  after  amb, SpO2=87% on RA with initial check, incr to 90s with seated rest  Stairs            Wheelchair Mobility     Tilt Bed    Modified Rankin (Stroke Patients Only)       Balance Overall balance assessment: Needs assistance Sitting-balance support: Feet supported, No upper extremity supported Sitting balance-Leahy Scale: Good     Standing balance support: Reliant on assistive device for balance, During functional activity Standing balance-Leahy Scale: Fair                               Pertinent Vitals/Pain Pain Assessment Pain Assessment: No/denies pain    Home Living Family/patient expects to be discharged to:: Private residence Living Arrangements: Spouse/significant other Available Help at Discharge: Family Type of Home: House Home Access: Stairs to enter   [employer] of Steps: 1  Home Layout: One level Home Equipment: [employer] (2 wheels);Cane - single point Additional Comments: recent falls per pt grandson    Prior Function Prior Level of Function : History of Falls (last six months)             Mobility Comments: pt reports independence, does not typically ambulate with device. pt  grandson confirms       Extremity/Trunk Assessment   Upper Extremity Assessment Upper Extremity Assessment: Defer to OT evaluation    Lower Extremity Assessment Lower Extremity Assessment: Generalized weakness       Communication   Communication Communication: No apparent difficulties Cueing Techniques: Visual cues;Verbal cues;Gestural cues;Tactile cues  Cognition Arousal: Alert Behavior During Therapy: WFL for tasks assessed/performed Overall Cognitive Status: History of cognitive impairments - at baseline Area of Impairment: Following commands, Safety/judgement, Memory                     Memory: Decreased short-term memory Following Commands: Follows one step commands consistently Safety/Judgement: Decreased awareness of safety, Decreased awareness of deficits     General Comments: pt grandson present for session, tells PT that pt and spouse (his grandparents) recently lost his mother her sister around [place].        General Comments      Exercises     Assessment/Plan    PT Assessment Patient needs continued PT services  PT Problem List Decreased strength;Decreased range of motion;Decreased activity tolerance;Decreased mobility;Decreased balance;Decreased cognition;Decreased safety awareness;Decreased knowledge of use of DME       PT Treatment Interventions DME instruction;Gait training;Functional mobility training;Therapeutic activities;Therapeutic exercise;Patient/family education    PT Goals (Current goals can be found in the Care Plan section)  Acute Rehab PT Goals Patient Stated Goal: to get out of here PT Goal Formulation: With patient/family Time For Goal Achievement: 08/11/23 Potential to Achieve Goals: Good    Frequency Min 1X/week     Co-evaluation PT/OT/SLP Co-Evaluation/Treatment: Yes Reason for Co-Treatment: To address functional/ADL transfers PT goals addressed during session: Mobility/safety with mobility;Proper use of DME OT  goals addressed during session: ADL's and self-care       AM-PAC PT 6 Clicks Mobility  Outcome Measure Help needed turning from your back to your side while in a flat bed without using bedrails?: A Little Help needed moving from lying on your back to sitting on the side of a flat bed without using bedrails?: A Little Help needed moving to and from a bed to a chair (including a wheelchair)?: A Little Help needed standing up from a chair using your arms (e.g., wheelchair or bedside chair)?: A Little Help needed to walk in hospital room?: A Little Help needed climbing 3-5 steps with a railing? : A Lot 6 Click Score: 17    End of Session Equipment Utilized During Treatment: Gait belt Activity Tolerance: Patient tolerated treatment well Patient left: with call bell/phone within reach;in chair;with chair alarm set;with family/visitor present   PT Visit Diagnosis: Other abnormalities of gait and mobility (R26.89);Difficulty in walking, not elsewhere classified (R26.2)    Time: 8849-8787 PT Time Calculation (min) (ACUTE ONLY): 18 min   Charges:   PT Evaluation $PT Eval Low Complexity: 1 Low   PT General Charges $$ ACUTE PT VISIT: 1 Visit         Jaivion Kingsley, PT  Acute Rehab Dept [facility]) [phone]  07/28/2023   [facility] 07/28/2023, 1:03 PM

## 2023-07-28 NOTE — TOC Initial Note (Signed)
 Transition of Care [facility]) - Initial/Assessment Note    Patient Details  Name: Rachael Carpenter MRN: [id number] Date of Birth: 07-16-1935  Transition of Care [facility]) CM/SW Contact:    Alfonse JONELLE Rex, RN Phone Number: 07/28/2023, 12:19 PM  Clinical Narrative:  Met with pt/spouse and grandson Hezzie) at bedside to introduce role of TOC/NCM and review for dc planning. Per spouse/Jason, pt has an established PCP and pharmacy, spouse reports pt has a nurse who comes to check her BP a couple of times a week and was receiving  HH PT, reports pt has a walker she uses for functional mobility. NCM spoke with grandson, Selinda, outside of the room, reports pt recently loss 2 of her daughters, has a daughter who is currently not in good health, reports pt has been falling a lot of home, states they have a good support system of neighbors and friends who assist and he can call them to help when patient takes a fall at home . Selinda states he feels pt may need LTC, reports to his knowledge pt has no LTC insurance and family has some savings/assets, discussed options of out of pocket for 24 hr home care givers, voiced understanding and states savings will go quickly with that option.  PT eval pending, await recommendation.  TOC will continue to follow.                Expected Discharge Plan: Skilled Nursing Facility Barriers to Discharge: Continued Medical Work up   Patient Goals and CMS Choice Patient states their goals for this hospitalization and ongoing recovery are:: return home          Expected Discharge Plan and Services       Living arrangements for the past 2 months: Single Family Home                                      Prior Living Arrangements/Services Living arrangements for the past 2 months: Single Family Home Lives with:: Spouse Patient language and need for interpreter reviewed:: Yes        Need for Family Participation in Patient Care: Yes (Comment) Care giver support  system in place?: Yes (comment) Current home services: DME (walker) Criminal Activity/Legal Involvement Pertinent to Current Situation/Hospitalization: No - Comment as needed  Activities of Daily Living   ADL Screening (condition at time of admission) Independently performs ADLs?: No Does the patient have a NEW difficulty with bathing/dressing/toileting/self-feeding that is expected to last >3 days?: Yes (Initiates electronic notice to provider for possible OT consult) Does the patient have a NEW difficulty with getting in/out of bed, walking, or climbing stairs that is expected to last >3 days?: Yes (Initiates electronic notice to provider for possible PT consult) Does the patient have a NEW difficulty with communication that is expected to last >3 days?: No Is the patient deaf or have difficulty hearing?: No Does the patient have difficulty seeing, even when wearing glasses/contacts?: No Does the patient have difficulty concentrating, remembering, or making decisions?: Yes  Permission Sought/Granted                  Emotional Assessment Appearance:: Appears stated age Attitude/Demeanor/Rapport: Gracious Affect (typically observed): Accepting   Alcohol  / Substance Use: Not Applicable Psych Involvement: No (comment)  Admission diagnosis:  Cellulitis of buttock [L03.317] Cellulitis of lower extremity, unspecified laterality [L03.119] Severe dementia with other behavioral disturbance, unspecified  dementia type (HCC) [F03.C18] Patient Active Problem List   Diagnosis Date Noted   Cellulitis of buttock 07/27/2023   Chronic kidney disease, stage 3a (HCC) 07/27/2023   Anemia 07/27/2023   Elevated CK 07/27/2023   Generalized weakness 07/27/2023   Dementia (HCC) 04/28/2018   Encephalopathy 03/05/2018   Hypothyroidism 03/05/2018   AKI (acute kidney injury) (HCC) 03/05/2018   Suicidal ideation 03/05/2018   B12 deficiency 03/05/2018   Toxic encephalopathy 03/05/2018   Psychosis,  paranoid (HCC) 03/05/2018   Hallucinations 02/28/2018   HYPERLIPIDEMIA 07/11/2010   Essential hypertension 07/11/2010   DYSPNEA 07/11/2010   COUGH 07/11/2010   PCP:  Yolande Toribio MATSU, MD Pharmacy:   [phone] - [place], [place] - [address] AT [facility] [address] [place] [address] Phone: [phone] Fax: [phone]     Social Drivers of Health (SDOH) Social History: SDOH Screenings   Food Insecurity: No Food Insecurity (07/28/2023)  Housing: Low Risk  (07/28/2023)  Transportation Needs: No Transportation Needs (07/28/2023)  Utilities: Not At Risk (07/28/2023)  Social Connections: Socially Integrated (07/28/2023)  Tobacco Use: Low Risk  (07/27/2023)   SDOH Interventions:     Readmission Risk Interventions     No data to display

## 2023-07-28 NOTE — Progress Notes (Signed)
 PROGRESS NOTE Rachael Carpenter  FMW:[id number] DOB: April 05, 1935 DOA: 07/27/2023 PCP: Yolande Toribio MATSU, MD  Brief Narrative/Hospital Course: 89 y/o female with medical history significant for dementia, CKD stage IIIa, HTN, HLD, hypothyroidism, obesity presented from home with weakness increased confusion from baseline-who is admitted with cellulitis of the buttocks and generalized weakness.  She does have advanced dementia, has chronic swelling edema of her lower extremities recently having left ankle pain unable to see PCP due to difficulty with transport, at baseline she cannot stand up on her own and with some effort can walk a little bit with use of walker. In the ED low-grade temperature,Labs show WBC 11.5, hemoglobin 8.6, platelets 317,000, sodium 133, potassium 4.2, bicarb 21, BUN 23, creatinine 1.07, serum glucose 107, LFTs within normal limits, CK4 147.  TSH 13.470, valproate level in process.  Lactic acid 1.7.  UA negative for UTI.  Blood cultures in process.  SARS-CoV-2, influenza, [place] are negative. CT head without contrast negative for acute intracranial process.  Mild diffuse atrophy and moderate chronic small vessel ischemic changes noted. CXR> negative for focal consolidation, Dema, effusion. Pelvic x-ray negative for evidence of pelvic fractures or diastases.  Degenerative changes noted. Patient was given IV vancomycin  and cefepime  the hospitalist service was consulted to admit for further evaluation and management.      Subjective: Seen and examined Alert awake oriented to self [facility] her DOB. No complaints States she lives with her husband Overnight afebrile BP stable Labs showed hyponatremia 130 stable renal function, CK6 32, anemia panel showed ferritin low saturation low B12 on lower side 181 folate normal CBC with mild leukocytosis and chronic anemia.   Assessment and Plan: Principal Problem:   Cellulitis of buttock Active Problems:   Generalized weakness    Essential hypertension   Hypothyroidism   Dementia (HCC)   Chronic kidney disease, stage 3a (HCC)   Anemia   Elevated CK   Cellulitis of the buttocks: Patient having mild cellulitis with several excoriation/stage I pressure wound, with mild leukocytosis, continue current antibiotics Ancef , wound care  Generalized weakness/debility/deconditioning Patient had worsening weakness from baseline chronic edematous changes on both legs.  Recent left ankle pain, x-ray left ankle no acute finding, check duplex.  Continue PT OT  Mild rhabdomyolysis : CK slightly up, encourage oral hydration  Recent Labs  Lab 07/27/23 2055 07/28/23 0347  CKTOTAL 447* 632*     Normocytic anemia: Likely multifactorial with iron deficiency, chronic disease.  B12 on borderline side will replace, add iron supplement. Previous hemoglobin 13 g in February 2021. Recent Labs    07/27/23 2055 07/28/23 0347  HGB 8.6* 8.2*  MCV 79.3* 79.8*  VITAMINB12  --  181  FOLATE  --  21.8  FERRITIN  --  7*  TIBC  --  486*  IRON  --  29    CKD stage IIIa: Renal function stable.  Monitor Recent Labs    07/27/23 2055 07/28/23 0347  BUN 23 20  CREATININE 1.07* 0.93  CO2 21* 21*  K 4.2 4.1     Delirium/encephalopathy superimposed on background of dementia: She is alert awake pleasantly confused with baseline dementia.  Continue delirium precaution, resume home Depakote , Aricept  Seroquel    Hypothyroidism: TSH is 13.470. FT4 pending, resume Synthroid .    Hypertension: BP is stable.   DVT prophylaxis: enoxaparin  (LOVENOX ) injection 40 mg Start: 07/28/23 1000 Code Status:   Code Status: Limited: Do not attempt resuscitation (DNR) -DNR-LIMITED -Do Not Intubate/DNI  Family Communication: plan of  care discussed with patient/none at bedside.  I tried calling her husband and daughter no answer. Patient status is: Remains hospitalized because of severity of illness Level of care: Med-Surg   Dispo: The patient is from:  home w/ husband            Anticipated disposition: TBD, pending PT Objective: Vitals last 24 hrs: Vitals:   07/27/23 2330 07/28/23 0040 07/28/23 0541 07/28/23 0944  BP: (!) 144/59 (!) 126/58 (!) 161/56 (!) 138/55  Pulse: 75 83 82 86  Resp: 20 18 18 16   Temp:  98.1 F (36.7 C) 98 F (36.7 C) 98.2 F (36.8 C)  TempSrc:  Oral Oral   SpO2: 100% 100% 97% 98%  Weight:      Height:       Weight change:   Physical Examination: General exam: alert awake,at baseline, older than stated age HEENT:Oral mucosa moist, Ear/Nose WNL grossly Respiratory system: Bilaterally clear BS,no use of accessory muscle Cardiovascular system: S1 & S2 +, No JVD. Gastrointestinal system: Abdomen soft,NT,ND, BS+ Nervous System: Alert, awake, orientd x2 moving all extremities,and following commands. Extremities: LE edema neg,distal peripheral pulses palpable and warm.  Skin: No rashes,no icterus. MSK: Normal muscle bulk,tone, power   Medications reviewed:  Scheduled Meds:  amLODipine   2.5 mg Oral Daily   aspirin  EC  81 mg Oral Daily   vitamin B-12  1,000 mcg Oral Daily   divalproex   125 mg Oral BID   donepezil   10 mg Oral QHS   enoxaparin  (LOVENOX ) injection  40 mg Subcutaneous Q24H   [START ON 07/29/2023] ferrous sulfate   325 mg Oral Q breakfast   folic acid   1 mg Oral Daily   Gerhardt's butt cream   Topical TID   levothyroxine   200 mcg Oral Daily   [START ON 07/29/2023] levothyroxine   50 mcg Oral QAC breakfast   Multivitamin Adults  1 tablet Oral Daily   omega-3 acid ethyl esters  1 g Oral Daily   pyridOXINE   100 mg Oral Daily   QUEtiapine   200 mg Oral Daily   Continuous Infusions:   ceFAZolin  (ANCEF ) IV 1 g (07/28/23 0811)     Diet Order             Diet Heart Room service appropriate? Yes; Fluid consistency: Thin  Diet effective now                 No intake or output data in the 24 hours ending 07/28/23 1047 Net IO Since Admission: No IO data has been entered for this period [07/28/23  1047]  Wt Readings from Last 3 Encounters:  07/27/23 85 kg  02/08/19 85.3 kg  04/27/18 88.5 kg     Unresulted Labs (From admission, onward)     Start     Ordered   07/28/23 0500  T4, free  Tomorrow morning,   R        07/27/23 2359   07/27/23 2132  Blood culture (routine x 2)  BLOOD CULTURE X 2,   R (with STAT occurrences)      07/27/23 2131           Data Reviewed: I have personally reviewed following labs and imaging studies CBC: Recent Labs  Lab 07/27/23 2055 07/28/23 0347  WBC 11.5* 10.8*  NEUTROABS 8.3*  --   HGB 8.6* 8.2*  HCT 29.1* 28.0*  MCV 79.3* 79.8*  PLT 317 292   Basic Metabolic Panel:  Recent Labs  Lab 07/27/23 2055 07/28/23 0347  NA 133* 130*  K 4.2 4.1  CL 101 98  CO2 21* 21*  GLUCOSE 107* 96  BUN 23 20  CREATININE 1.07* 0.93  CALCIUM  8.5* 8.1*   GFR: Estimated Creatinine Clearance: 48.7 mL/min (by C-G formula based on SCr of 0.93 mg/dL). Liver Function Tests:  Recent Labs  Lab 07/27/23 2055  AST 34  ALT 15  ALKPHOS 64  BILITOT 0.6  PROT 7.1  ALBUMIN 3.6   Recent Labs    07/27/23 2055  TSH 13.470*   Sepsis Labs: Recent Labs  Lab 07/27/23 2216  LATICACIDVEN 1.7   Recent Results (from the past 240 hours)  Resp panel by RT-PCR (RSV, Flu A&B, Covid) Anterior Nasal Swab     Status: None   Collection Time: 07/27/23  8:55 PM   Specimen: Anterior Nasal Swab  Result Value Ref Range Status   SARS Coronavirus 2 by RT PCR NEGATIVE NEGATIVE Final    Comment: (NOTE) SARS-CoV-2 target nucleic acids are NOT DETECTED.  The SARS-CoV-2 RNA is generally detectable in upper respiratory specimens during the acute phase of infection. The lowest concentration of SARS-CoV-2 viral copies this assay can detect is 138 copies/mL. A negative result does not preclude SARS-Cov-2 infection and should not be used as the sole basis for treatment or other patient management decisions. A negative result may occur with  improper specimen  collection/handling, submission of specimen other than nasopharyngeal swab, presence of viral mutation(s) within the areas targeted by this assay, and inadequate number of viral copies(<138 copies/mL). A negative result must be combined with clinical observations, patient history, and epidemiological information. The expected result is Negative.  Fact Sheet for Patients:  bloggercourse.com  Fact Sheet for Healthcare Providers:  seriousbroker.it  This test is no t yet approved or cleared by the [place]  FDA and  has been authorized for detection and/or diagnosis of SARS-CoV-2 by FDA under an Emergency Use Authorization (EUA). This EUA will remain  in effect (meaning this test can be used) for the duration of the COVID-19 declaration under Section 564(b)(1) of the Act, 21 U.S.C.section 360bbb-3(b)(1), unless the authorization is terminated  or revoked sooner.       Influenza A by PCR NEGATIVE NEGATIVE Final   Influenza B by PCR NEGATIVE NEGATIVE Final    Comment: (NOTE) The Xpert Xpress SARS-CoV-2/FLU/RSV plus assay is intended as an aid in the diagnosis of influenza from Nasopharyngeal swab specimens and should not be used as a sole basis for treatment. Nasal washings and aspirates are unacceptable for Xpert Xpress SARS-CoV-2/FLU/RSV testing.  Fact Sheet for Patients: bloggercourse.com  Fact Sheet for Healthcare Providers: seriousbroker.it  This test is not yet approved or cleared by the [place]  FDA and has been authorized for detection and/or diagnosis of SARS-CoV-2 by FDA under an Emergency Use Authorization (EUA). This EUA will remain in effect (meaning this test can be used) for the duration of the COVID-19 declaration under Section 564(b)(1) of the Act, 21 U.S.C. section 360bbb-3(b)(1), unless the authorization is terminated or revoked.     Resp Syncytial  Virus by PCR NEGATIVE NEGATIVE Final    Comment: (NOTE) Fact Sheet for Patients: bloggercourse.com  Fact Sheet for Healthcare Providers: seriousbroker.it  This test is not yet approved or cleared by the [place]  FDA and has been authorized for detection and/or diagnosis of SARS-CoV-2 by FDA under an Emergency Use Authorization (EUA). This EUA will remain in effect (meaning this test can be used) for the duration of the COVID-19 declaration under  Section 564(b)(1) of the Act, 21 U.S.C. section 360bbb-3(b)(1), unless the authorization is terminated or revoked.  Performed at [facility], [address], [place], [place] [address]     Antimicrobials/Microbiology: Anti-infectives (From admission, onward)    Start     Dose/Rate Route Frequency Ordered Stop   07/28/23 0800  ceFAZolin  (ANCEF ) IVPB 1 g/50 mL premix        1 g 100 mL/hr over 30 Minutes Intravenous Every 8 hours 07/27/23 2359 08/04/23 0759   07/27/23 2145  vancomycin  (VANCOCIN ) IVPB 1000 mg/200 mL premix        1,000 mg 200 mL/hr over 60 Minutes Intravenous  Once 07/27/23 2131 07/28/23 0030   07/27/23 2145  ceFEPIme  (MAXIPIME ) 2 g in sodium chloride  0.9 % 100 mL IVPB        2 g 200 mL/hr over 30 Minutes Intravenous  Once 07/27/23 2133 07/27/23 2332         Component Value Date/Time   SDES  02/17/2019 0427    URINE, CLEAN CATCH Performed at [facility], [address] [place], [place] [address]    SPECREQUEST  02/17/2019 9572    NONE Performed at [facility], [address] [place], [place] [address]    CULT  02/17/2019 0427    NO GROWTH Performed at [facility], [address], [place], [place] [address]    REPTSTATUS 02/18/2019 FINAL 02/17/2019 0427     Radiology Studies: DG Ankle 2 Views Left Result Date: 07/28/2023 CLINICAL DATA:  Left ankle pain EXAM: LEFT ANKLE - 2 VIEW COMPARISON:   None Available. FINDINGS: No acute bony abnormality. Specifically, no fracture, subluxation, or dislocation. Small plantar calcaneal spur. Diffuse soft tissue swelling. IMPRESSION: No acute bony abnormality. Electronically Signed   By: Franky Crease M.D.   On: 07/28/2023 00:19   CT HEAD WO CONTRAST ( ) Result Date: 07/27/2023 CLINICAL DATA:  Mental status change EXAM: CT HEAD WITHOUT CONTRAST TECHNIQUE: Contiguous axial images were obtained from the base of the skull through the vertex without intravenous contrast. RADIATION DOSE REDUCTION: This exam was performed according to the departmental dose-optimization program which includes automated exposure control, adjustment of the mA and/or kV according to patient size and/or use of iterative reconstruction technique. COMPARISON:  MRI brain 03/05/2018. FINDINGS: Brain: No evidence of acute infarction, hemorrhage, hydrocephalus, extra-axial collection or mass lesion/mass effect. There is mild diffuse atrophy and moderate periventricular white matter hypodensity, similar to the prior study. Vascular: Atherosclerotic calcifications are present within the cavernous internal carotid arteries. Skull: Normal. Negative for fracture or focal lesion. Sinuses/Orbits: No acute finding. Other: None. IMPRESSION: 1. No acute intracranial process. 2. Mild diffuse atrophy and moderate chronic small vessel ischemic changes. Electronically Signed   By: Greig Pique M.D.   On: 07/27/2023 22:28   DG Chest Port 1 View Result Date: 07/27/2023 CLINICAL DATA:  Fall injury.  Chronic kidney disease. EXAM: PORTABLE PELVIS 1-2 VIEWS; PORTABLE CHEST - 1 VIEW COMPARISON:  Portable chest 02/17/2019, flat plate abdomen view including the pelvis 02/20/2018. FINDINGS: Chest AP portable 8:44 p.m.: Mild cardiomegaly. No evidence of CHF. There is mild aortic tortuosity with stable mediastinum. The lungs are clear. There is no measurable pleural effusion or pneumothorax. Osteopenia and thoracic  spondylosis. No acute regional skeletal findings. Portable AP pelvis single view: Mild osteopenia. No evidence of pelvic fractures or diastasis. No AP evidence of fractures of the visualized proximal femurs. There is asymmetric moderate right hip arthrosis, mild  degenerative change left hip, spurring of the symphysis pubis and SI joints. There is facet hypertrophy in the visualized lower lumbar spine. Soft tissues are unremarkable. IMPRESSION: 1. No evidence of acute chest disease. Mild cardiomegaly. 2. No evidence of pelvic fractures or diastasis. 3. Degenerative changes. Electronically Signed   By: Francis Quam M.D.   On: 07/27/2023 21:14   DG Pelvis Portable Result Date: 07/27/2023 CLINICAL DATA:  Fall injury.  Chronic kidney disease. EXAM: PORTABLE PELVIS 1-2 VIEWS; PORTABLE CHEST - 1 VIEW COMPARISON:  Portable chest 02/17/2019, flat plate abdomen view including the pelvis 02/20/2018. FINDINGS: Chest AP portable 8:44 p.m.: Mild cardiomegaly. No evidence of CHF. There is mild aortic tortuosity with stable mediastinum. The lungs are clear. There is no measurable pleural effusion or pneumothorax. Osteopenia and thoracic spondylosis. No acute regional skeletal findings. Portable AP pelvis single view: Mild osteopenia. No evidence of pelvic fractures or diastasis. No AP evidence of fractures of the visualized proximal femurs. There is asymmetric moderate right hip arthrosis, mild degenerative change left hip, spurring of the symphysis pubis and SI joints. There is facet hypertrophy in the visualized lower lumbar spine. Soft tissues are unremarkable. IMPRESSION: 1. No evidence of acute chest disease. Mild cardiomegaly. 2. No evidence of pelvic fractures or diastasis. 3. Degenerative changes. Electronically Signed   By: Francis Quam M.D.   On: 07/27/2023 21:14     LOS: 1 day   Total time spent in review of labs and imaging, patient evaluation, formulation of plan, documentation and communication with family:  35 minutes  Mennie LAMY, MD [facility]  07/28/2023, 10:47 AM

## 2023-07-28 NOTE — Consult Note (Addendum)
 WOC Nurse Consult Note: Reason for Consult: cellulitis of buttocks  Wound type:1.  Moisture Associated Skin Damage  2.  Deep Tissue Pressure Injury L upper posterior thigh x 2  Pressure Injury POA: Yes Measurement: 1. Widespread erythema and scattered partial thickness skin loss related to moisture and friction to sacrum/buttocks/upper thighs  2.  Two separate areas of Deep Tissue Pressure Injury one linear 1 cm x 3 cm purple maroon skin intact, the other more medial 2 cm x 1 cm 50% purple maroon 50% pink moist  Wound bed: as above  Drainage (amount, consistency, odor) minimal serosanguinous  Periwound: erythema; per MD note patient was sitting in urine at home and is noted to be incontinent at time of this visit  Dressing procedure/placement/frequency: Cleanse sacrum/buttocks/upper posterior thighs with Vashe wound cleanser [name] [phone]), apply Gerhardt's butt cream 3 times a day and prn soiling.  May sprinkle over Gerhardt's with floor stock antifungal powder (Microguard white and green label).   I did note multiple abrasions/excoriations to lower legs; all appear dry and would not require wound care at this time.   POC discussed with bedside nurse. WOC team will not follow. Re-consult if further needs arise.   Thank you,     Powell Bar MSN, RN-BC, [employer] [phone]

## 2023-07-29 DIAGNOSIS — L03317 Cellulitis of buttock: Secondary | ICD-10-CM | POA: Diagnosis not present

## 2023-07-29 LAB — CK: Total CK: 282 U/L — ABNORMAL HIGH (ref 38–234)

## 2023-07-29 NOTE — NC FL2 (Addendum)
 [place]  MEDICAID FL2 LEVEL OF CARE FORM     IDENTIFICATION  Patient Name: ANALISIA KINGSFORD Birthdate: [birth year] Sex: female Admission Date (Current Location): 07/27/2023  [facility] [place] Number:  Producer, Television/film/video and Address:  [facility],  [address] [place], [place] [address]      Provider Number: [id number]  Attending Physician Name and Address:  Christobal Guadalajara, MD  Relative Name and Phone Number:  Effie Selinda Mires)  [phone] [place])    Current Level of Care: Hospital Recommended Level of Care: Skilled Nursing Facility Prior Approval Number:    Date Approved/Denied:   PASRR Number: [id number]  Discharge Plan: SNF    Current Diagnoses: Patient Active Problem List   Diagnosis Date Noted   Cellulitis of buttock 07/27/2023   Chronic kidney disease, stage 3a (HCC) 07/27/2023   Anemia 07/27/2023   Elevated CK 07/27/2023   Generalized weakness 07/27/2023   Dementia (HCC) 04/28/2018   Encephalopathy 03/05/2018   Hypothyroidism 03/05/2018   AKI (acute kidney injury) (HCC) 03/05/2018   Suicidal ideation 03/05/2018   B12 deficiency 03/05/2018   Toxic encephalopathy 03/05/2018   Psychosis, paranoid (HCC) 03/05/2018   Hallucinations 02/28/2018   HYPERLIPIDEMIA 07/11/2010   Essential hypertension 07/11/2010   DYSPNEA 07/11/2010   COUGH 07/11/2010    Orientation RESPIRATION BLADDER Height & Weight     Self  Normal Continent, External catheter Weight: 85 kg Height:  5' 7 (170.2 cm)  BEHAVIORAL SYMPTOMS/MOOD NEUROLOGICAL BOWEL NUTRITION STATUS      Continent Diet (heart diet)  AMBULATORY STATUS COMMUNICATION OF NEEDS Skin   Limited Assist Verbally Normal                       Personal Care Assistance Level of Assistance  Bathing, Feeding, Dressing Bathing Assistance: Limited assistance Feeding assistance: Limited assistance Dressing Assistance: Limited assistance     Functional Limitations Info  Sight, Hearing, Speech Sight  Info: Impaired Hearing Info: Impaired Speech Info: Impaired    SPECIAL CARE FACTORS FREQUENCY  PT (By licensed PT), OT (By licensed OT)     PT Frequency: 5x/wk OT Frequency: 5x/wk            Contractures Contractures Info: Not present    Additional Factors Info  Code Status, Allergies, Psychotropic Code Status Info: DNR Allergies Info: Atorvastatin , Fluoxetine, Penicillins Psychotropic Info: Seroquel  20mg  po qhs         Current Medications (07/29/2023):  This is the current hospital active medication list Current Facility-Administered Medications  Medication Dose Route Frequency Provider Last Rate Last Admin   acetaminophen  (TYLENOL ) tablet 650 mg  650 mg Oral Q6H PRN Patel, Vishal R, MD       Or   acetaminophen  (TYLENOL ) suppository 650 mg  650 mg Rectal Q6H PRN Patel, Vishal R, MD       amLODipine  (NORVASC ) tablet 5 mg  5 mg Oral Daily Kc, Ramesh, MD   5 mg at 07/29/23 9166   aspirin  EC tablet 81 mg  81 mg Oral Daily Kc, Ramesh, MD   81 mg at 07/29/23 9166   bisacodyl  (DULCOLAX) EC tablet 5 mg  5 mg Oral Daily PRN Patel, Vishal R, MD       ceFAZolin  (ANCEF ) IVPB 1 g/50 mL premix  1 g Intravenous Q8H Patel, Vishal R, MD 100 mL/hr at 07/29/23 0825 1 g at 07/29/23 0825   cyanocobalamin  (VITAMIN B12) tablet 1,000 mcg  1,000 mcg Oral Daily Christobal Guadalajara, MD   1,000  mcg at 07/29/23 9166   divalproex  (DEPAKOTE ) DR tablet 125 mg  125 mg Oral Daily Kc, Ramesh, MD   125 mg at 07/29/23 0836   donepezil  (ARICEPT ) tablet 10 mg  10 mg Oral QHS Kc, Mennie, MD   10 mg at 07/28/23 2113   enoxaparin  (LOVENOX ) injection 40 mg  40 mg Subcutaneous Q24H Patel, Vishal R, MD   40 mg at 07/29/23 9165   ferrous sulfate  tablet 325 mg  325 mg Oral Q breakfast Kc, Mennie, MD   325 mg at 07/29/23 9175   folic acid  (FOLVITE ) tablet 1 mg  1 mg Oral Daily Kc, Ramesh, MD   1 mg at 07/29/23 9166   Gerhardt's butt cream   Topical TID Christobal Mennie, MD   Given at 07/29/23 0836   levothyroxine  (SYNTHROID ) tablet 150  mcg  150 mcg Oral Q0600 Christobal Mennie, MD   150 mcg at 07/29/23 9370   metoprolol  succinate (TOPROL -XL) 24 hr tablet 50 mg  50 mg Oral Daily Kc, Ramesh, MD   50 mg at 07/29/23 9166   multivitamin with minerals tablet 1 tablet  1 tablet Oral Daily Kc, Mennie, MD   1 tablet at 07/29/23 9164   ondansetron  (ZOFRAN ) tablet 4 mg  4 mg Oral Q6H PRN Patel, Vishal R, MD       Or   ondansetron  (ZOFRAN ) injection 4 mg  4 mg Intravenous Q6H PRN Patel, Vishal R, MD       pyridOXINE  (VITAMIN B6) tablet 100 mg  100 mg Oral Daily Kc, Ramesh, MD   100 mg at 07/29/23 0834   QUEtiapine  (SEROQUEL ) tablet 200 mg  200 mg Oral QHS Kc, Mennie, MD   200 mg at 07/28/23 2113   senna-docusate (Senokot-S) tablet 1 tablet  1 tablet Oral QHS PRN Patel, Vishal R, MD         Discharge Medications: Please see discharge summary for a list of discharge medications.  Relevant Imaging Results:  Relevant Lab Results:   Additional Information SSN: [id number]  Alfonse JONELLE Rex, RN

## 2023-07-29 NOTE — Progress Notes (Signed)
 PROGRESS NOTE Rachael Carpenter  FMW:[id number] DOB: 07/30/1934 DOA: 07/27/2023 PCP: Yolande Toribio MATSU, MD  Brief Narrative/Hospital Course: 89 y/o female with medical history significant for dementia, CKD stage IIIa, HTN, HLD, hypothyroidism, obesity presented from home with weakness increased confusion from baseline-who is admitted with cellulitis of the buttocks and generalized weakness.  She does have advanced dementia, has chronic swelling edema of her lower extremities recently having left ankle pain unable to see PCP due to difficulty with transport, at baseline she cannot stand up on her own and with some effort can walk a little bit with use of walker. In the ED low-grade temperature,Labs show WBC 11.5, hemoglobin 8.6, platelets 317,000, sodium 133, potassium 4.2, bicarb 21, BUN 23, creatinine 1.07, serum glucose 107, LFTs within normal limits, CK4 147.  TSH 13.470, valproate level in process.  Lactic acid 1.7.  UA negative for UTI.  Blood cultures in process.  SARS-CoV-2, influenza, [place] are negative. CT head without contrast negative for acute intracranial process.  Mild diffuse atrophy and moderate chronic small vessel ischemic changes noted. CXR> negative for focal consolidation, Dema, effusion. Pelvic x-ray negative for evidence of pelvic fractures or diastases.  Degenerative changes noted. Patient was given IV vancomycin  and cefepime  the hospitalist service was consulted to admit for further evaluation and management. Patient remains deconditioned and weak PT OT recommending skilled nursing facility, [facility] consulted      Subjective: Patient seen and examined. She is alert awake oriented to self place people Reports her husband was here yesterday Overnight patient remains afebrile BP stable No complaints  Assessment and Plan: Principal Problem:   Cellulitis of buttock Active Problems:   Generalized weakness   Essential hypertension   Hypothyroidism   Dementia (HCC)    Chronic kidney disease, stage 3a (HCC)   Anemia   Elevated CK   Cellulitis of the buttocks: Patient having mild cellulitis with several excoriation/stage I  pressure wound, with mild leukocytosis Continue local wound care, continue Ancef .  Generalized weakness/debility/deconditioning Patient had worsening weakness from baseline chronic edematous changes on both legs. Recent left ankle pain, x-ray left ankle no acute finding, checking duplex.  Continue PT OT> planning for skilled nursing facility  Mild rhabdomyolysis : CK resolving.  Encourage better oral intake  Recent Labs  Lab 07/27/23 2055 07/28/23 0347 07/29/23 0902  CKTOTAL 447* 632* 282*     Normocytic anemia: Likely multifactorial with iron deficiency, chronic disease.  B12 on borderline side will replace, cont on iron supplement. Previous hemoglobin 13 g in February 2021. Recent Labs    07/27/23 2055 07/28/23 0347  HGB 8.6* 8.2*  MCV 79.3* 79.8*  VITAMINB12  --  181  FOLATE  --  21.8  FERRITIN  --  7*  TIBC  --  486*  IRON  --  29    CKD stage IIIa: Renal function remains stable as below.  Monitor  Recent Labs    07/27/23 2055 07/28/23 0347  BUN 23 20  CREATININE 1.07* 0.93  CO2 21* 21*  K 4.2 4.1     Delirium/encephalopathy superimposed on background of dementia: She is alert awake pleasantly confused with baseline dementia.  She is alert awake follows commands appropriately intermittent confusion present.  PT recommending skilled nursing facility.  Continue home Depakote  Aricept  Seroquel    Hypothyroidism: TSH is 13.470. FT4 normal 0.9 continue home Synthroid     Hypertension: BP is stable.   DVT prophylaxis: enoxaparin  (LOVENOX ) injection 40 mg Start: 07/28/23 1000 Code Status:   Code Status:  Limited: Do not attempt resuscitation (DNR) -DNR-LIMITED -Do Not Intubate/DNI  Family Communication: plan of care discussed with patient/none at bedside. 1/8 tried calling her husband and daughter no  answer. Patient status is: Remains hospitalized because of severity of illness Level of care: Med-Surg   Dispo: The patient is from: home w/ husband            Anticipated disposition: SNF today or tomorrow once available   Objective: Vitals last 24 hrs: Vitals:   07/28/23 1329 07/28/23 1743 07/28/23 2225 07/29/23 0638  BP: (!) 130/53 (!) 115/55 (!) 140/50 (!) 135/57  Pulse: 81 82 76 90  Resp: 16 18 18 18   Temp: 98 F (36.7 C) 98.2 F (36.8 C) (!) 97.4 F (36.3 C) 98.3 F (36.8 C)  TempSrc:  Oral    SpO2: 99% 99% 97% 95%  Weight:      Height:       Weight change:   Physical Examination: General exam: alert awake, oriented to self place.  Pleasant confused HEENT:Oral mucosa moist, Ear/Nose WNL grossly Respiratory system: Bilaterally clear BS,no use of accessory muscle Cardiovascular system: S1 & S2 +, No JVD. Gastrointestinal system: Abdomen soft,NT,ND, BS+ Nervous System: Alert, awake, moving all extremities,and following commands. Extremities: LE edema neg,distal peripheral pulses palpable and warm.  Skin: No rashes,no icterus. MSK: Normal muscle bulk,tone, power   Medications reviewed:  Scheduled Meds:  amLODipine   5 mg Oral Daily   aspirin  EC  81 mg Oral Daily   vitamin B-12  1,000 mcg Oral Daily   divalproex   125 mg Oral Daily   donepezil   10 mg Oral QHS   enoxaparin  (LOVENOX ) injection  40 mg Subcutaneous Q24H   ferrous sulfate   325 mg Oral Q breakfast   folic acid   1 mg Oral Daily   Gerhardt's butt cream   Topical TID   levothyroxine   150 mcg Oral Q0600   metoprolol  succinate  50 mg Oral Daily   multivitamin with minerals  1 tablet Oral Daily   pyridOXINE   100 mg Oral Daily   QUEtiapine   200 mg Oral QHS   Continuous Infusions:   ceFAZolin  (ANCEF ) IV 1 g (07/29/23 0825)     Diet Order             Diet Heart Room service appropriate? Yes; Fluid consistency: Thin  Diet effective now                  Intake/Output Summary (Last 24 hours) at  07/29/2023 1054 Last data filed at 07/29/2023 1024 Gross per 24 hour  Intake 390 ml  Output 640 ml  Net -250 ml   Net IO Since Admission: -10 mL [07/29/23 1054]  Wt Readings from Last 3 Encounters:  07/27/23 85 kg  02/08/19 85.3 kg  04/27/18 88.5 kg     Unresulted Labs (From admission, onward)    None      Data Reviewed: I have personally reviewed following labs and imaging studies CBC: Recent Labs  Lab 07/27/23 2055 07/28/23 0347  WBC 11.5* 10.8*  NEUTROABS 8.3*  --   HGB 8.6* 8.2*  HCT 29.1* 28.0*  MCV 79.3* 79.8*  PLT 317 292   Basic Metabolic Panel:  Recent Labs  Lab 07/27/23 2055 07/28/23 0347  NA 133* 130*  K 4.2 4.1  CL 101 98  CO2 21* 21*  GLUCOSE 107* 96  BUN 23 20  CREATININE 1.07* 0.93  CALCIUM  8.5* 8.1*   GFR: Estimated Creatinine Clearance: 48.7 mL/min (  by C-G formula based on SCr of 0.93 mg/dL). Liver Function Tests:  Recent Labs  Lab 07/27/23 2055  AST 34  ALT 15  ALKPHOS 64  BILITOT 0.6  PROT 7.1  ALBUMIN 3.6   Recent Labs    07/27/23 2055 07/28/23 0347  TSH 13.470*  --   FREET4  --  0.94   Sepsis Labs: Recent Labs  Lab 07/27/23 2216  LATICACIDVEN 1.7   Recent Results (from the past 240 hours)  Resp panel by RT-PCR (RSV, Flu A&B, Covid) Anterior Nasal Swab     Status: None   Collection Time: 07/27/23  8:55 PM   Specimen: Anterior Nasal Swab  Result Value Ref Range Status   SARS Coronavirus 2 by RT PCR NEGATIVE NEGATIVE Final    Comment: (NOTE) SARS-CoV-2 target nucleic acids are NOT DETECTED.  The SARS-CoV-2 RNA is generally detectable in upper respiratory specimens during the acute phase of infection. The lowest concentration of SARS-CoV-2 viral copies this assay can detect is 138 copies/mL. A negative result does not preclude SARS-Cov-2 infection and should not be used as the sole basis for treatment or other patient management decisions. A negative result may occur with  improper specimen collection/handling,  submission of specimen other than nasopharyngeal swab, presence of viral mutation(s) within the areas targeted by this assay, and inadequate number of viral copies(<138 copies/mL). A negative result must be combined with clinical observations, patient history, and epidemiological information. The expected result is Negative.  Fact Sheet for Patients:  bloggercourse.com  Fact Sheet for Healthcare Providers:  seriousbroker.it  This test is no t yet approved or cleared by the [place]  FDA and  has been authorized for detection and/or diagnosis of SARS-CoV-2 by FDA under an Emergency Use Authorization (EUA). This EUA will remain  in effect (meaning this test can be used) for the duration of the COVID-19 declaration under Section 564(b)(1) of the Act, 21 U.S.C.section 360bbb-3(b)(1), unless the authorization is terminated  or revoked sooner.       Influenza A by PCR NEGATIVE NEGATIVE Final   Influenza B by PCR NEGATIVE NEGATIVE Final    Comment: (NOTE) The Xpert Xpress SARS-CoV-2/FLU/RSV plus assay is intended as an aid in the diagnosis of influenza from Nasopharyngeal swab specimens and should not be used as a sole basis for treatment. Nasal washings and aspirates are unacceptable for Xpert Xpress SARS-CoV-2/FLU/RSV testing.  Fact Sheet for Patients: bloggercourse.com  Fact Sheet for Healthcare Providers: seriousbroker.it  This test is not yet approved or cleared by the [place]  FDA and has been authorized for detection and/or diagnosis of SARS-CoV-2 by FDA under an Emergency Use Authorization (EUA). This EUA will remain in effect (meaning this test can be used) for the duration of the COVID-19 declaration under Section 564(b)(1) of the Act, 21 U.S.C. section 360bbb-3(b)(1), unless the authorization is terminated or revoked.     Resp Syncytial Virus by PCR NEGATIVE  NEGATIVE Final    Comment: (NOTE) Fact Sheet for Patients: bloggercourse.com  Fact Sheet for Healthcare Providers: seriousbroker.it  This test is not yet approved or cleared by the [place]  FDA and has been authorized for detection and/or diagnosis of SARS-CoV-2 by FDA under an Emergency Use Authorization (EUA). This EUA will remain in effect (meaning this test can be used) for the duration of the COVID-19 declaration under Section 564(b)(1) of the Act, 21 U.S.C. section 360bbb-3(b)(1), unless the authorization is terminated or revoked.  Performed at [facility], [address] [place],  [place] [address]   Blood culture (routine x 2)     Status: None (Preliminary result)   Collection Time: 07/27/23 10:00 PM   Specimen: BLOOD  Result Value Ref Range Status   Specimen Description   Final    BLOOD RIGHT ANTECUBITAL Performed at [facility], [address] [place], [place] [address]    Special Requests   Final    BOTTLES DRAWN AEROBIC AND ANAEROBIC Blood Culture results may not be optimal due to an inadequate volume of blood received in culture bottles Performed at [facility], [address], [place], [place] [address]    Culture   Final    NO GROWTH 1 DAY Performed at [facility], [address] [place], [place] [address]    Report Status PENDING  Incomplete  Blood culture (routine x 2)     Status: None (Preliminary result)   Collection Time: 07/27/23 10:14 PM   Specimen: BLOOD RIGHT FOREARM  Result Value Ref Range Status   Specimen Description   Final    BLOOD RIGHT FOREARM Performed at [facility], [address] [place], [place] [address]    Special Requests   Final    BOTTLES DRAWN AEROBIC AND ANAEROBIC Blood Culture adequate volume Performed at [facility] [address], [place], [place] [address]     Culture   Final    NO GROWTH 1 DAY Performed at [facility], [address], [place], [place] [address]    Report Status PENDING  Incomplete    Antimicrobials/Microbiology: Anti-infectives (From admission, onward)    Start     Dose/Rate Route Frequency Ordered Stop   07/28/23 0800  ceFAZolin  (ANCEF ) IVPB 1 g/50 mL premix        1 g 100 mL/hr over 30 Minutes Intravenous Every 8 hours 07/27/23 2359 08/04/23 0759   07/27/23 2145  vancomycin  (VANCOCIN ) IVPB 1000 mg/200 mL premix        1,000 mg 200 mL/hr over 60 Minutes Intravenous  Once 07/27/23 2131 07/28/23 0030   07/27/23 2145  ceFEPIme  (MAXIPIME ) 2 g in sodium chloride  0.9 % 100 mL IVPB        2 g 200 mL/hr over 30 Minutes Intravenous  Once 07/27/23 2133 07/27/23 2332         Component Value Date/Time   SDES  07/27/2023 2214    BLOOD RIGHT FOREARM Performed at [facility], [address], [place], [place] [address]    SPECREQUEST  07/27/2023 2214    BOTTLES DRAWN AEROBIC AND ANAEROBIC Blood Culture adequate volume Performed at [facility], [address], [place], [place] [address]    CULT  07/27/2023 2214    NO GROWTH 1 DAY Performed at [facility], [address] [place], [place] [address]    REPTSTATUS PENDING 07/27/2023 2214     Radiology Studies: DG Ankle 2 Views Left Result Date: 07/28/2023 CLINICAL DATA:  Left ankle pain EXAM: LEFT ANKLE - 2 VIEW COMPARISON:  None Available. FINDINGS: No acute bony abnormality. Specifically, no fracture, subluxation, or dislocation. Small plantar calcaneal spur. Diffuse soft tissue swelling. IMPRESSION: No acute bony abnormality. Electronically Signed   By: Franky Crease M.D.   On: 07/28/2023 00:19   CT HEAD WO CONTRAST ( ) Result Date: 07/27/2023 CLINICAL DATA:  Mental status change EXAM: CT HEAD WITHOUT CONTRAST TECHNIQUE: Contiguous axial images were obtained from the base of the skull through  the vertex without intravenous  contrast. RADIATION DOSE REDUCTION: This exam was performed according to the departmental dose-optimization program which includes automated exposure control, adjustment of the mA and/or kV according to patient size and/or use of iterative reconstruction technique. COMPARISON:  MRI brain 03/05/2018. FINDINGS: Brain: No evidence of acute infarction, hemorrhage, hydrocephalus, extra-axial collection or mass lesion/mass effect. There is mild diffuse atrophy and moderate periventricular white matter hypodensity, similar to the prior study. Vascular: Atherosclerotic calcifications are present within the cavernous internal carotid arteries. Skull: Normal. Negative for fracture or focal lesion. Sinuses/Orbits: No acute finding. Other: None. IMPRESSION: 1. No acute intracranial process. 2. Mild diffuse atrophy and moderate chronic small vessel ischemic changes. Electronically Signed   By: Greig Pique M.D.   On: 07/27/2023 22:28   DG Chest Port 1 View Result Date: 07/27/2023 CLINICAL DATA:  Fall injury.  Chronic kidney disease. EXAM: PORTABLE PELVIS 1-2 VIEWS; PORTABLE CHEST - 1 VIEW COMPARISON:  Portable chest 02/17/2019, flat plate abdomen view including the pelvis 02/20/2018. FINDINGS: Chest AP portable 8:44 p.m.: Mild cardiomegaly. No evidence of CHF. There is mild aortic tortuosity with stable mediastinum. The lungs are clear. There is no measurable pleural effusion or pneumothorax. Osteopenia and thoracic spondylosis. No acute regional skeletal findings. Portable AP pelvis single view: Mild osteopenia. No evidence of pelvic fractures or diastasis. No AP evidence of fractures of the visualized proximal femurs. There is asymmetric moderate right hip arthrosis, mild degenerative change left hip, spurring of the symphysis pubis and SI joints. There is facet hypertrophy in the visualized lower lumbar spine. Soft tissues are unremarkable. IMPRESSION: 1. No evidence of acute chest disease. Mild cardiomegaly. 2. No  evidence of pelvic fractures or diastasis. 3. Degenerative changes. Electronically Signed   By: Francis Quam M.D.   On: 07/27/2023 21:14   DG Pelvis Portable Result Date: 07/27/2023 CLINICAL DATA:  Fall injury.  Chronic kidney disease. EXAM: PORTABLE PELVIS 1-2 VIEWS; PORTABLE CHEST - 1 VIEW COMPARISON:  Portable chest 02/17/2019, flat plate abdomen view including the pelvis 02/20/2018. FINDINGS: Chest AP portable 8:44 p.m.: Mild cardiomegaly. No evidence of CHF. There is mild aortic tortuosity with stable mediastinum. The lungs are clear. There is no measurable pleural effusion or pneumothorax. Osteopenia and thoracic spondylosis. No acute regional skeletal findings. Portable AP pelvis single view: Mild osteopenia. No evidence of pelvic fractures or diastasis. No AP evidence of fractures of the visualized proximal femurs. There is asymmetric moderate right hip arthrosis, mild degenerative change left hip, spurring of the symphysis pubis and SI joints. There is facet hypertrophy in the visualized lower lumbar spine. Soft tissues are unremarkable. IMPRESSION: 1. No evidence of acute chest disease. Mild cardiomegaly. 2. No evidence of pelvic fractures or diastasis. 3. Degenerative changes. Electronically Signed   By: Francis Quam M.D.   On: 07/27/2023 21:14     LOS: 2 days   Total time spent in review of labs and imaging, patient evaluation, formulation of plan, documentation and communication with family: 35 minutes  Mennie LAMY, MD [facility]  07/29/2023, 10:54 AM

## 2023-07-29 NOTE — TOC PASRR Note (Signed)
 30 Day PASRR Note   Patient Details  Name: Rachael Carpenter Date of Birth: [birth year]   Transition of Care [facility]) CM/SW Contact:    Alfonse JONELLE Rex, RN Phone Number: 07/29/2023, 12:57 PM  To Whom It May Concern:  Please be advised that this patient will require a short-term nursing home stay - anticipated 30 days or less for rehabilitation and strengthening.   The plan is for return home.

## 2023-07-29 NOTE — Plan of Care (Signed)

## 2023-07-29 NOTE — TOC Progression Note (Addendum)
 Transition of Care [facility]) - Progression Note    Patient Details  Name: Rachael Carpenter MRN: [id number] Date of Birth: [birth year]  [facility]) CM/SW Contact  Alfonse JONELLE Rex, RN Phone Number: 07/29/2023, 10:13 AM  Clinical Narrative: PT eval completed, recommendation for short term rehab/SNF. Call to Emerson (grandson) , left vm with NCM name and phone number requesting call back.     -10:53am Call received from Panther (grandson), reviewed PT eval recommendation for short term rehab/SNF, states he will discuss with pt's spouse and call me back. TOC will continue to follow.  -11:43am Met with [employer] Social Worker, [place], contact [phone], on unit, states pt's dtr make referral to [facility] out of concern for spouse's ability to continue to care for pt at home, updated on PT recommendation for short term rehab/SNF with probable transition to LTC per grandson's Hezzie). Viki provided NCM name and phone number for contact and updates.   -2:12pm Call received from grandson, Selinda, family agreeable to short term rehab/SNF. Level 2 PASRR pending, faxed out for bed offers.  -3:48pm Met with spouse at bedside to review PT recommendation for short term rehab/SNF, spouse agreeable, no preference, reports discussed with Selinda (grandson) and both agreeable. Await bed offers.     Expected Discharge Plan: Skilled Nursing Facility Barriers to Discharge: Continued Medical Work up  Expected Discharge Plan and Services       Living arrangements for the past 2 months: Single Family Home                                       Social Determinants of Health (SDOH) Interventions SDOH Screenings   Food Insecurity: No Food Insecurity (07/28/2023)  Housing: Low Risk  (07/28/2023)  Transportation Needs: No Transportation Needs (07/28/2023)  Utilities: Not At Risk (07/28/2023)  Social Connections: Socially Integrated (07/28/2023)  Tobacco Use: Low Risk  (07/27/2023)    Readmission Risk  Interventions     No data to display

## 2023-07-30 ENCOUNTER — Inpatient Hospital Stay (HOSPITAL_COMMUNITY): Payer: Medicare Other

## 2023-07-30 DIAGNOSIS — M7989 Other specified soft tissue disorders: Secondary | ICD-10-CM | POA: Diagnosis not present

## 2023-07-30 DIAGNOSIS — L03317 Cellulitis of buttock: Secondary | ICD-10-CM | POA: Diagnosis not present

## 2023-07-30 NOTE — TOC Progression Note (Signed)
 Transition of Care [facility]) - Progression Note    Patient Details  Name: Rachael Carpenter MRN: [id number] Date of Birth: 08/29/1934  Transition of Care [facility]) CM/SW Contact  Alfonse JONELLE Rex, RN Phone Number: 07/30/2023, 2:54 PM  Clinical Narrative:  Call to pt's grandson, selinda, to review short term rehab/SNF bed offers ( [place], [facility], [place], [place], [place]), [place] request list be emailed to him at : [email or web address] for review. TOC will continue to follow.      Expected Discharge Plan: Skilled Nursing Facility Barriers to Discharge: Continued Medical Work up  Expected Discharge Plan and Services       Living arrangements for the past 2 months: Single Family Home                                       Social Determinants of Health (SDOH) Interventions SDOH Screenings   Food Insecurity: No Food Insecurity (07/28/2023)  Housing: Low Risk  (07/28/2023)  Transportation Needs: No Transportation Needs (07/28/2023)  Utilities: Not At Risk (07/28/2023)  Social Connections: Socially Integrated (07/28/2023)  Tobacco Use: Low Risk  (07/27/2023)    Readmission Risk Interventions     No data to display

## 2023-07-30 NOTE — Progress Notes (Signed)
 Lower extremity venous duplex completed. Please see CV Procedures for preliminary results.  Shona Simpson, RVT 07/30/23 10:31 AM

## 2023-07-30 NOTE — Progress Notes (Signed)
 PROGRESS NOTE Rachael Carpenter  FMW:[id number] DOB: 15-Feb-1935 DOA: 07/27/2023 PCP: Yolande Toribio MATSU, MD  Brief Narrative/Hospital Course: 89 y/o female with medical history significant for dementia, CKD stage IIIa, HTN, HLD, hypothyroidism, obesity presented from home with weakness increased confusion from baseline-who is admitted with cellulitis of the buttocks and generalized weakness.  She does have advanced dementia, has chronic swelling edema of her lower extremities recently having left ankle pain unable to see PCP due to difficulty with transport, at baseline she cannot stand up on her own and with some effort can walk a little bit with use of walker. In the ED low-grade temperature,Labs show WBC 11.5, hemoglobin 8.6, platelets 317,000, sodium 133, potassium 4.2, bicarb 21, BUN 23, creatinine 1.07, serum glucose 107, LFTs within normal limits, CK4 147.  TSH 13.470, valproate level in process.  Lactic acid 1.7.  UA negative for UTI.  Blood cultures in process.  SARS-CoV-2, influenza, [place] are negative. CT head without contrast negative for acute intracranial process.  Mild diffuse atrophy and moderate chronic small vessel ischemic changes noted. CXR> negative for focal consolidation, Dema, effusion. Pelvic x-ray negative for evidence of pelvic fractures or diastases.  Degenerative changes noted. Patient was given IV vancomycin  and cefepime  the hospitalist service was consulted to admit for further evaluation and management. Patient remains deconditioned and weak PT OT recommending skilled nursing facility At this time waiting for placement    Subjective: Patient seen and examined Alert awake oriented to self current hospital interactive with conversation Overnight afebrile BP stable  Assessment and Plan: Principal Problem:   Cellulitis of buttock Active Problems:   Generalized weakness   Essential hypertension   Hypothyroidism   Dementia (HCC)   Chronic kidney disease, stage 3a  (HCC)   Anemia   Elevated CK   Cellulitis of the buttocks: Patient having mild cellulitis with several excoriation/stage I  pressure wound, with mild leukocytosis Continue local wound care, continue Ancef > switch to p.o. upon discharge  Generalized weakness/debility/deconditioning Patient had worsening weakness from baseline chronic edematous changes on both legs. Recent left ankle pain, x-ray left ankle no acute finding, duplex negative for DVT Continue PT OT> planning for skilled nursing facility  Mild rhabdomyolysis : CK improved 632>283. Encourage oral intake.    Normocytic anemia: Likely multifactorial with iron deficiency, chronic disease.  B12 on borderline side will replace, cont on iron supplement. Previous hemoglobin 13 g in February 2021. Recent Labs    07/27/23 2055 07/28/23 0347  HGB 8.6* 8.2*  MCV 79.3* 79.8*  VITAMINB12  --  181  FOLATE  --  21.8  FERRITIN  --  7*  TIBC  --  486*  IRON  --  29    CKD stage IIIa: Renal function remains stable as below.  Monitor  Recent Labs    07/27/23 2055 07/28/23 0347  BUN 23 20  CREATININE 1.07* 0.93  CO2 21* 21*  K 4.2 4.1     Delirium/encephalopathy superimposed on background of dementia: She is alert awake pleasantly confused with baseline dementia.  She is alert awake follows commands appropriately intermittent confusion present.  PT recommending skilled nursing facility.  Continue home Depakote  Aricept  Seroquel    Hypothyroidism: TSH is 13.470. FT4 normal 0.9 continue home Synthroid     Hypertension: BP is stable.   DVT prophylaxis: enoxaparin  (LOVENOX ) injection 40 mg Start: 07/28/23 1000 Code Status:   Code Status: Limited: Do not attempt resuscitation (DNR) -DNR-LIMITED -Do Not Intubate/DNI  Family Communication: plan of care discussed with patient/none  at bedside. 1/8 tried calling her husband and daughter no answer. TOC has reached out to family for placement Patient status is: Remains hospitalized  because of severity of illness Level of care: Med-Surg   Dispo: The patient is from: home w/ husband            Anticipated disposition:Pending SNF.   Objective: Vitals last 24 hrs: Vitals:   07/29/23 0638 07/29/23 1321 07/29/23 2220 07/30/23 0538  BP: (!) 135/57 (!) 132/48 (!) 134/48 (!) 125/53  Pulse: 90 75 73 76  Resp: 18 19 19 18   Temp: 98.3 F (36.8 C) 98 F (36.7 C) (!) 97.5 F (36.4 C) 97.6 F (36.4 C)  TempSrc:      SpO2: 95% 98% 100% 99%  Weight:      Height:       Weight change:   Physical Examination: General exam: alert awake, oriented x2 HEENT:Oral mucosa moist, Ear/Nose WNL grossly Respiratory system: Bilaterally clear BS,no use of accessory muscle Cardiovascular system: S1 & S2 +, No JVD. Gastrointestinal system: Abdomen soft,NT,ND, BS+ Nervous System: Alert, awake, moving all extremities,and following commands. Extremities: LE edema neg,distal peripheral pulses palpable and warm.  Skin: No rashes,no icterus. MSK: Normal muscle bulk,tone, power   Medications reviewed:  Scheduled Meds:  amLODipine   5 mg Oral Daily   aspirin  EC  81 mg Oral Daily   vitamin B-12  1,000 mcg Oral Daily   divalproex   125 mg Oral Daily   donepezil   10 mg Oral QHS   enoxaparin  (LOVENOX ) injection  40 mg Subcutaneous Q24H   ferrous sulfate   325 mg Oral Q breakfast   folic acid   1 mg Oral Daily   Gerhardt's butt cream   Topical TID   levothyroxine   150 mcg Oral Q0600   metoprolol  succinate  50 mg Oral Daily   multivitamin with minerals  1 tablet Oral Daily   pyridOXINE   100 mg Oral Daily   QUEtiapine   200 mg Oral QHS   Continuous Infusions:   ceFAZolin  (ANCEF ) IV 1 g (07/30/23 0906)     Diet Order             Diet Heart Room service appropriate? Yes; Fluid consistency: Thin  Diet effective now                  Intake/Output Summary (Last 24 hours) at 07/30/2023 1050 Last data filed at 07/30/2023 1022 Gross per 24 hour  Intake 475 ml  Output 550 ml  Net -75  ml   Net IO Since Admission: -85 mL [07/30/23 1050]  Wt Readings from Last 3 Encounters:  07/27/23 85 kg  02/08/19 85.3 kg  04/27/18 88.5 kg     Unresulted Labs (From admission, onward)    None      Data Reviewed: I have personally reviewed following labs and imaging studies CBC: Recent Labs  Lab 07/27/23 2055 07/28/23 0347  WBC 11.5* 10.8*  NEUTROABS 8.3*  --   HGB 8.6* 8.2*  HCT 29.1* 28.0*  MCV 79.3* 79.8*  PLT 317 292   Basic Metabolic Panel:  Recent Labs  Lab 07/27/23 2055 07/28/23 0347  NA 133* 130*  K 4.2 4.1  CL 101 98  CO2 21* 21*  GLUCOSE 107* 96  BUN 23 20  CREATININE 1.07* 0.93  CALCIUM  8.5* 8.1*   GFR: Estimated Creatinine Clearance: 48.7 mL/min (by C-G formula based on SCr of 0.93 mg/dL). Liver Function Tests:  Recent Labs  Lab 07/27/23 2055  AST  34  ALT 15  ALKPHOS 64  BILITOT 0.6  PROT 7.1  ALBUMIN 3.6   Recent Labs    07/27/23 2055 07/28/23 0347  TSH 13.470*  --   FREET4  --  0.94   Sepsis Labs: Recent Labs  Lab 07/27/23 2216  LATICACIDVEN 1.7   Recent Results (from the past 240 hours)  Resp panel by RT-PCR (RSV, Flu A&B, Covid) Anterior Nasal Swab     Status: None   Collection Time: 07/27/23  8:55 PM   Specimen: Anterior Nasal Swab  Result Value Ref Range Status   SARS Coronavirus 2 by RT PCR NEGATIVE NEGATIVE Final    Comment: (NOTE) SARS-CoV-2 target nucleic acids are NOT DETECTED.  The SARS-CoV-2 RNA is generally detectable in upper respiratory specimens during the acute phase of infection. The lowest concentration of SARS-CoV-2 viral copies this assay can detect is 138 copies/mL. A negative result does not preclude SARS-Cov-2 infection and should not be used as the sole basis for treatment or other patient management decisions. A negative result may occur with  improper specimen collection/handling, submission of specimen other than nasopharyngeal swab, presence of viral mutation(s) within the areas targeted by  this assay, and inadequate number of viral copies(<138 copies/mL). A negative result must be combined with clinical observations, patient history, and epidemiological information. The expected result is Negative.  Fact Sheet for Patients:  bloggercourse.com  Fact Sheet for Healthcare Providers:  seriousbroker.it  This test is no t yet approved or cleared by the [place]  FDA and  has been authorized for detection and/or diagnosis of SARS-CoV-2 by FDA under an Emergency Use Authorization (EUA). This EUA will remain  in effect (meaning this test can be used) for the duration of the COVID-19 declaration under Section 564(b)(1) of the Act, 21 U.S.C.section 360bbb-3(b)(1), unless the authorization is terminated  or revoked sooner.       Influenza A by PCR NEGATIVE NEGATIVE Final   Influenza B by PCR NEGATIVE NEGATIVE Final    Comment: (NOTE) The Xpert Xpress SARS-CoV-2/FLU/RSV plus assay is intended as an aid in the diagnosis of influenza from Nasopharyngeal swab specimens and should not be used as a sole basis for treatment. Nasal washings and aspirates are unacceptable for Xpert Xpress SARS-CoV-2/FLU/RSV testing.  Fact Sheet for Patients: bloggercourse.com  Fact Sheet for Healthcare Providers: seriousbroker.it  This test is not yet approved or cleared by the [place]  FDA and has been authorized for detection and/or diagnosis of SARS-CoV-2 by FDA under an Emergency Use Authorization (EUA). This EUA will remain in effect (meaning this test can be used) for the duration of the COVID-19 declaration under Section 564(b)(1) of the Act, 21 U.S.C. section 360bbb-3(b)(1), unless the authorization is terminated or revoked.     Resp Syncytial Virus by PCR NEGATIVE NEGATIVE Final    Comment: (NOTE) Fact Sheet for Patients: bloggercourse.com  Fact Sheet  for Healthcare Providers: seriousbroker.it  This test is not yet approved or cleared by the [place]  FDA and has been authorized for detection and/or diagnosis of SARS-CoV-2 by FDA under an Emergency Use Authorization (EUA). This EUA will remain in effect (meaning this test can be used) for the duration of the COVID-19 declaration under Section 564(b)(1) of the Act, 21 U.S.C. section 360bbb-3(b)(1), unless the authorization is terminated or revoked.  Performed at [facility], [address] [place], [place] [address]   Blood culture (routine x 2)     Status: None (Preliminary result)   Collection Time:  07/27/23 10:00 PM   Specimen: BLOOD  Result Value Ref Range Status   Specimen Description   Final    BLOOD RIGHT ANTECUBITAL Performed at [facility], [address] [place], [place] [address]    Special Requests   Final    BOTTLES DRAWN AEROBIC AND ANAEROBIC Blood Culture results may not be optimal due to an inadequate volume of blood received in culture bottles Performed at [facility], [address] [place], [place] [address]    Culture   Final    NO GROWTH 2 DAYS Performed at [facility], [address] [place], [place] [address]    Report Status PENDING  Incomplete  Blood culture (routine x 2)     Status: None (Preliminary result)   Collection Time: 07/27/23 10:14 PM   Specimen: BLOOD RIGHT FOREARM  Result Value Ref Range Status   Specimen Description   Final    BLOOD RIGHT FOREARM Performed at [facility], [address] [place], [place] [address]    Special Requests   Final    BOTTLES DRAWN AEROBIC AND ANAEROBIC Blood Culture adequate volume Performed at [facility], [address], [place], [place] [address]    Culture   Final    NO GROWTH 2 DAYS Performed at [facility], [address] [place], [place] [address]     Report Status PENDING  Incomplete    Antimicrobials/Microbiology: Anti-infectives (From admission, onward)    Start     Dose/Rate Route Frequency Ordered Stop   07/28/23 0800  ceFAZolin  (ANCEF ) IVPB 1 g/50 mL premix        1 g 100 mL/hr over 30 Minutes Intravenous Every 8 hours 07/27/23 2359 08/04/23 0759   07/27/23 2145  vancomycin  (VANCOCIN ) IVPB 1000 mg/200 mL premix        1,000 mg 200 mL/hr over 60 Minutes Intravenous  Once 07/27/23 2131 07/28/23 0030   07/27/23 2145  ceFEPIme  (MAXIPIME ) 2 g in sodium chloride  0.9 % 100 mL IVPB        2 g 200 mL/hr over 30 Minutes Intravenous  Once 07/27/23 2133 07/27/23 2332         Component Value Date/Time   SDES  07/27/2023 2214    BLOOD RIGHT FOREARM Performed at [facility], [address] [place], [place] [address]    SPECREQUEST  07/27/2023 2214    BOTTLES DRAWN AEROBIC AND ANAEROBIC Blood Culture adequate volume Performed at [facility], [address], [place], [place] [address]    CULT  07/27/2023 2214    NO GROWTH 2 DAYS Performed at [facility], [address], [place], [place] [address]    REPTSTATUS PENDING 07/27/2023 2214     Radiology Studies: VAS US  LOWER EXTREMITY VENOUS (DVT) Result Date: 07/30/2023  Lower Venous DVT Study Patient Name:  LATERICA MATARAZZO  Date of Exam:   07/30/2023 Medical Rec #: [id number]        Accession #:    [id number] Date of Birth: 09/08/1934        Patient Gender: F Patient Age:   54 years Exam Location[facility]  [facility] Procedure:      VAS US  LOWER EXTREMITY VENOUS (DVT) Referring Phys: Jakoby Melendrez --------------------------------------------------------------------------------  Indications: Edema, and Swelling.  Risk Factors: Past pregnancy and obesity. Limitations: Body habitus and poor ultrasound/tissue interface. Performing Technologist: Garnette Rockers  Examination Guidelines: A complete evaluation includes  B-mode imaging, spectral Doppler, color  Doppler, and power Doppler as needed of all accessible portions of each vessel. Bilateral testing is considered an integral part of a complete examination. Limited examinations for reoccurring indications may be performed as noted. The reflux portion of the exam is performed with the patient in reverse Trendelenburg.  +-----+---------------+---------+-----------+----------+--------------+ RIGHTCompressibilityPhasicitySpontaneityPropertiesThrombus Aging +-----+---------------+---------+-----------+----------+--------------+ CFV  Full           Yes      Yes                                 +-----+---------------+---------+-----------+----------+--------------+   +---------+---------------+---------+-----------+----------+-------------------+ LEFT     CompressibilityPhasicitySpontaneityPropertiesThrombus Aging      +---------+---------------+---------+-----------+----------+-------------------+ CFV      Full           Yes      Yes                                      +---------+---------------+---------+-----------+----------+-------------------+ SFJ      Full                                                             +---------+---------------+---------+-----------+----------+-------------------+ FV Prox  Full                                                             +---------+---------------+---------+-----------+----------+-------------------+ FV Mid   Full                                                             +---------+---------------+---------+-----------+----------+-------------------+ FV Distal                                             Not well visualized +---------+---------------+---------+-----------+----------+-------------------+ PFV      Full                                                             +---------+---------------+---------+-----------+----------+-------------------+ POP      Full           Yes      Yes                                       +---------+---------------+---------+-----------+----------+-------------------+ PTV  Not well visualized +---------+---------------+---------+-----------+----------+-------------------+ PERO                                                  Not well visualized +---------+---------------+---------+-----------+----------+-------------------+ Avascular fluid mass seen behind left knee measuring 3.16 x 1.68 x 2.43 cm.   Summary: RIGHT: - No evidence of common femoral vein obstruction.   LEFT: - There is no evidence of deep vein thrombosis in the lower extremity.  - A cystic structure is found in the popliteal fossa.  *See table(s) above for measurements and observations.    Preliminary       LOS: 3 days   Total time spent in review of labs and imaging, patient evaluation, formulation of plan, documentation and communication with family: 35 minutes  Mennie LAMY, MD [facility]  07/30/2023, 10:50 AM

## 2023-07-30 NOTE — Progress Notes (Signed)
 Physical Therapy Treatment Patient Details Name: Rachael Carpenter MRN: [id number] DOB: [birth year] Today's Date: 07/30/2023   History of Present Illness 89 yo female who presented from home with weakness, increased  confusion from baseline; admitted with cellulitis of the buttocks and generalized weakness. PMH: dementia, CKD stage IIIa, HTN, HLD, hypothyroidism, obesity, chronic edema LEs    PT Comments  Pt cooperative and progressing steadily with mobility but continues to fatigue easily and requiring assist for safe performance of basic mobility tasks.  Tx this am limited by pt need for US  testing.  Will follow.    If plan is discharge home, recommend the following: A little help with walking and/or transfers;A little help with bathing/dressing/bathroom;Assistance with cooking/housework;Assist for transportation;Help with stairs or ramp for entrance;Supervision due to cognitive status;Direct supervision/assist for medications management   Can travel by private vehicle     Yes  Equipment Recommendations  None recommended by PT    Recommendations for Other Services       Precautions / Restrictions Precautions Precautions: Fall Restrictions Weight Bearing Restrictions Per Provider Order: No     Mobility  Bed Mobility Overal bed mobility: Independent, Needs Assistance Bed Mobility: Supine to Sit, Sit to Supine     Supine to sit: Min assist Sit to supine: Min assist   General bed mobility comments: assist to manage trunk on bed exit and LE to return to bed    Transfers Overall transfer level: Needs assistance Equipment used: Rolling walker (2 wheels) Transfers: Sit to/from Stand Sit to Stand: Contact guard assist           General transfer comment: for safety, cues for hand placement and to control descent    Ambulation/Gait Ambulation/Gait assistance: Min assist Gait Distance (Feet): 38 Feet Assistive device: Rolling walker (2 wheels) Gait Pattern/deviations:  Step-to pattern, Step-through pattern, Decreased step length - right, Decreased step length - left, Shuffle, Trunk flexed Gait velocity: decr     General Gait Details: CUes for posture, position from RW and safety with assist for balance and RW management.   Stairs             Wheelchair Mobility     Tilt Bed    Modified Rankin (Stroke Patients Only)       Balance Overall balance assessment: Needs assistance Sitting-balance support: Feet supported, No upper extremity supported Sitting balance-Leahy Scale: Good     Standing balance support: Bilateral upper extremity supported, Reliant on assistive device for balance Standing balance-Leahy Scale: Fair                              Cognition Arousal: Alert Behavior During Therapy: WFL for tasks assessed/performed Overall Cognitive Status: History of cognitive impairments - at baseline Area of Impairment: Following commands, Safety/judgement, Memory                     Memory: Decreased short-term memory Following Commands: Follows one step commands consistently Safety/Judgement: Decreased awareness of safety, Decreased awareness of deficits              Exercises      General Comments        Pertinent Vitals/Pain Pain Assessment Pain Assessment: No/denies pain    Home Living                          Prior Function  PT Goals (current goals can now be found in the care plan section) Acute Rehab PT Goals Patient Stated Goal: to get out of here PT Goal Formulation: With patient/family Time For Goal Achievement: 08/11/23 Potential to Achieve Goals: Good Progress towards PT goals: Progressing toward goals    Frequency    Min 1X/week      PT Plan      Co-evaluation              AM-PAC PT 6 Clicks Mobility   Outcome Measure  Help needed turning from your back to your side while in a flat bed without using bedrails?: A Little Help needed  moving from lying on your back to sitting on the side of a flat bed without using bedrails?: A Little Help needed moving to and from a bed to a chair (including a wheelchair)?: A Little Help needed standing up from a chair using your arms (e.g., wheelchair or bedside chair)?: A Little Help needed to walk in hospital room?: A Little Help needed climbing 3-5 steps with a railing? : A Lot 6 Click Score: 17    End of Session Equipment Utilized During Treatment: Gait belt Activity Tolerance: Patient tolerated treatment well;Patient limited by fatigue Patient left: in bed;with call bell/phone within reach;with bed alarm set Nurse Communication: Mobility status PT Visit Diagnosis: Other abnormalities of gait and mobility (R26.89);Difficulty in walking, not elsewhere classified (R26.2)     Time: 0940-1000 PT Time Calculation (min) (ACUTE ONLY): 20 min  Charges:    $Gait Training: 8-22 mins PT General Charges $$ ACUTE PT VISIT: 1 Visit                     Katrinka Acton PT Acute Rehabilitation Services Pager [phone] Office [phone]    Kaori Jumper 07/30/2023, 1:20 PM

## 2023-07-30 NOTE — Progress Notes (Signed)
 Physical Therapy Treatment Patient Details Name: Rachael Carpenter MRN: [id number] DOB: 11-16-1934 Today's Date: 07/30/2023   History of Present Illness 89 yo female who presented from home with weakness, increased  confusion from baseline; admitted with cellulitis of the buttocks and generalized weakness. PMH: dementia, CKD stage IIIa, HTN, HLD, hypothyroidism, obesity, chronic edema LEs    PT Comments  Pt cooperative and progressing steadily with mobility but continues to fatigue easily and requiring assist for safe performance of basic mobility tasks.  This pm, pt up to ambulate increased distance in hall but requiring multiple standing rest breaks for task completion.   If plan is discharge home, recommend the following: A little help with walking and/or transfers;A little help with bathing/dressing/bathroom;Assistance with cooking/housework;Assist for transportation;Help with stairs or ramp for entrance;Supervision due to cognitive status;Direct supervision/assist for medications management   Can travel by private vehicle     Yes  Equipment Recommendations  None recommended by PT    Recommendations for Other Services       Precautions / Restrictions Precautions Precautions: Fall Restrictions Weight Bearing Restrictions Per Provider Order: No     Mobility  Bed Mobility Overal bed mobility: Needs Assistance Bed Mobility: Supine to Sit     Supine to sit: Contact guard, HOB elevated, Used rails Sit to supine: Min assist   General bed mobility comments: Increased time with use of rails    Transfers Overall transfer level: Needs assistance Equipment used: Rolling walker (2 wheels) Transfers: Sit to/from Stand Sit to Stand: Contact guard assist           General transfer comment: for safety, cues for hand placement and to control descent    Ambulation/Gait Ambulation/Gait assistance: Min assist Gait Distance (Feet): 53 Feet Assistive device: Rolling walker (2  wheels) Gait Pattern/deviations: Step-to pattern, Step-through pattern, Decreased step length - right, Decreased step length - left, Shuffle, Trunk flexed Gait velocity: decr     General Gait Details: CUes for posture, position from RW and safety with assist for balance and RW management.   Stairs             Wheelchair Mobility     Tilt Bed    Modified Rankin (Stroke Patients Only)       Balance Overall balance assessment: Needs assistance Sitting-balance support: Feet supported, No upper extremity supported Sitting balance-Leahy Scale: Good     Standing balance support: Bilateral upper extremity supported, Reliant on assistive device for balance Standing balance-Leahy Scale: Fair                              Cognition Arousal: Alert Behavior During Therapy: WFL for tasks assessed/performed Overall Cognitive Status: History of cognitive impairments - at baseline Area of Impairment: Following commands, Safety/judgement, Memory                     Memory: Decreased short-term memory Following Commands: Follows one step commands consistently Safety/Judgement: Decreased awareness of safety, Decreased awareness of deficits              Exercises      General Comments        Pertinent Vitals/Pain Pain Assessment Pain Assessment: No/denies pain    Home Living                          Prior Function  PT Goals (current goals can now be found in the care plan section) Acute Rehab PT Goals Patient Stated Goal: to get out of here PT Goal Formulation: With patient/family Time For Goal Achievement: 08/11/23 Potential to Achieve Goals: Good Progress towards PT goals: Progressing toward goals    Frequency    Min 1X/week      PT Plan      Co-evaluation              AM-PAC PT 6 Clicks Mobility   Outcome Measure  Help needed turning from your back to your side while in a flat bed without using  bedrails?: A Little Help needed moving from lying on your back to sitting on the side of a flat bed without using bedrails?: A Little Help needed moving to and from a bed to a chair (including a wheelchair)?: A Little Help needed standing up from a chair using your arms (e.g., wheelchair or bedside chair)?: A Little Help needed to walk in hospital room?: A Little Help needed climbing 3-5 steps with a railing? : A Lot 6 Click Score: 17    End of Session Equipment Utilized During Treatment: Gait belt Activity Tolerance: Patient tolerated treatment well;Patient limited by fatigue Patient left: in chair;with call bell/phone within reach;with chair alarm set;with nursing/sitter in room Nurse Communication: Mobility status PT Visit Diagnosis: Other abnormalities of gait and mobility (R26.89);Difficulty in walking, not elsewhere classified (R26.2)     Time: 8557-8495 PT Time Calculation (min) (ACUTE ONLY): 22 min  Charges:    $Gait Training: 8-22 mins PT General Charges $$ ACUTE PT VISIT: 1 Visit                     Katrinka Acton PT Acute Rehabilitation Services Pager [phone] Office [phone]    Adalid Beckmann 07/30/2023, 3:11 PM

## 2023-07-31 DIAGNOSIS — G9340 Encephalopathy, unspecified: Secondary | ICD-10-CM | POA: Diagnosis not present

## 2023-07-31 DIAGNOSIS — F29 Unspecified psychosis not due to a substance or known physiological condition: Secondary | ICD-10-CM | POA: Diagnosis not present

## 2023-07-31 DIAGNOSIS — F0280 Dementia in other diseases classified elsewhere without behavioral disturbance: Secondary | ICD-10-CM | POA: Diagnosis not present

## 2023-07-31 DIAGNOSIS — K219 Gastro-esophageal reflux disease without esophagitis: Secondary | ICD-10-CM | POA: Diagnosis not present

## 2023-07-31 DIAGNOSIS — S80819A Abrasion, unspecified lower leg, initial encounter: Secondary | ICD-10-CM | POA: Diagnosis not present

## 2023-07-31 DIAGNOSIS — E860 Dehydration: Secondary | ICD-10-CM | POA: Diagnosis not present

## 2023-07-31 DIAGNOSIS — J9600 Acute respiratory failure, unspecified whether with hypoxia or hypercapnia: Secondary | ICD-10-CM | POA: Diagnosis not present

## 2023-07-31 DIAGNOSIS — L03317 Cellulitis of buttock: Secondary | ICD-10-CM | POA: Diagnosis not present

## 2023-07-31 DIAGNOSIS — R21 Rash and other nonspecific skin eruption: Secondary | ICD-10-CM | POA: Diagnosis not present

## 2023-07-31 DIAGNOSIS — R059 Cough, unspecified: Secondary | ICD-10-CM | POA: Diagnosis not present

## 2023-07-31 DIAGNOSIS — L299 Pruritus, unspecified: Secondary | ICD-10-CM | POA: Diagnosis not present

## 2023-07-31 DIAGNOSIS — I10 Essential (primary) hypertension: Secondary | ICD-10-CM | POA: Diagnosis not present

## 2023-07-31 DIAGNOSIS — R531 Weakness: Secondary | ICD-10-CM | POA: Diagnosis not present

## 2023-07-31 DIAGNOSIS — G309 Alzheimer's disease, unspecified: Secondary | ICD-10-CM | POA: Diagnosis not present

## 2023-07-31 DIAGNOSIS — M5416 Radiculopathy, lumbar region: Secondary | ICD-10-CM | POA: Diagnosis not present

## 2023-07-31 DIAGNOSIS — D649 Anemia, unspecified: Secondary | ICD-10-CM | POA: Diagnosis not present

## 2023-07-31 DIAGNOSIS — R638 Other symptoms and signs concerning food and fluid intake: Secondary | ICD-10-CM | POA: Diagnosis not present

## 2023-07-31 DIAGNOSIS — M6282 Rhabdomyolysis: Secondary | ICD-10-CM | POA: Diagnosis not present

## 2023-07-31 DIAGNOSIS — R600 Localized edema: Secondary | ICD-10-CM | POA: Diagnosis not present

## 2023-07-31 DIAGNOSIS — F02A0 Dementia in other diseases classified elsewhere, mild, without behavioral disturbance, psychotic disturbance, mood disturbance, and anxiety: Secondary | ICD-10-CM | POA: Diagnosis not present

## 2023-07-31 DIAGNOSIS — E871 Hypo-osmolality and hyponatremia: Secondary | ICD-10-CM | POA: Diagnosis not present

## 2023-07-31 DIAGNOSIS — M150 Primary generalized (osteo)arthritis: Secondary | ICD-10-CM | POA: Diagnosis not present

## 2023-07-31 DIAGNOSIS — G301 Alzheimer's disease with late onset: Secondary | ICD-10-CM | POA: Diagnosis not present

## 2023-07-31 DIAGNOSIS — L0390 Cellulitis, unspecified: Secondary | ICD-10-CM | POA: Diagnosis not present

## 2023-07-31 DIAGNOSIS — I517 Cardiomegaly: Secondary | ICD-10-CM | POA: Diagnosis not present

## 2023-07-31 DIAGNOSIS — F331 Major depressive disorder, recurrent, moderate: Secondary | ICD-10-CM | POA: Diagnosis not present

## 2023-07-31 DIAGNOSIS — D638 Anemia in other chronic diseases classified elsewhere: Secondary | ICD-10-CM | POA: Diagnosis not present

## 2023-07-31 DIAGNOSIS — E785 Hyperlipidemia, unspecified: Secondary | ICD-10-CM | POA: Diagnosis not present

## 2023-07-31 DIAGNOSIS — Z79899 Other long term (current) drug therapy: Secondary | ICD-10-CM | POA: Diagnosis not present

## 2023-07-31 DIAGNOSIS — I6930 Unspecified sequelae of cerebral infarction: Secondary | ICD-10-CM | POA: Diagnosis not present

## 2023-07-31 DIAGNOSIS — Z7409 Other reduced mobility: Secondary | ICD-10-CM | POA: Diagnosis not present

## 2023-07-31 DIAGNOSIS — Z7401 Bed confinement status: Secondary | ICD-10-CM | POA: Diagnosis not present

## 2023-07-31 DIAGNOSIS — R41841 Cognitive communication deficit: Secondary | ICD-10-CM | POA: Diagnosis not present

## 2023-07-31 DIAGNOSIS — N182 Chronic kidney disease, stage 2 (mild): Secondary | ICD-10-CM | POA: Diagnosis not present

## 2023-07-31 DIAGNOSIS — Z9981 Dependence on supplemental oxygen: Secondary | ICD-10-CM | POA: Diagnosis not present

## 2023-07-31 DIAGNOSIS — R062 Wheezing: Secondary | ICD-10-CM | POA: Diagnosis not present

## 2023-07-31 DIAGNOSIS — E039 Hypothyroidism, unspecified: Secondary | ICD-10-CM | POA: Diagnosis not present

## 2023-07-31 DIAGNOSIS — B372 Candidiasis of skin and nail: Secondary | ICD-10-CM | POA: Diagnosis not present

## 2023-07-31 DIAGNOSIS — J9811 Atelectasis: Secondary | ICD-10-CM | POA: Diagnosis not present

## 2023-07-31 DIAGNOSIS — K5900 Constipation, unspecified: Secondary | ICD-10-CM | POA: Diagnosis not present

## 2023-07-31 DIAGNOSIS — M47894 Other spondylosis, thoracic region: Secondary | ICD-10-CM | POA: Diagnosis not present

## 2023-07-31 DIAGNOSIS — J101 Influenza due to other identified influenza virus with other respiratory manifestations: Secondary | ICD-10-CM | POA: Diagnosis not present

## 2023-07-31 DIAGNOSIS — R451 Restlessness and agitation: Secondary | ICD-10-CM | POA: Diagnosis not present

## 2023-07-31 DIAGNOSIS — N1831 Chronic kidney disease, stage 3a: Secondary | ICD-10-CM | POA: Diagnosis not present

## 2023-07-31 DIAGNOSIS — M6281 Muscle weakness (generalized): Secondary | ICD-10-CM | POA: Diagnosis not present

## 2023-07-31 DIAGNOSIS — M8580 Other specified disorders of bone density and structure, unspecified site: Secondary | ICD-10-CM | POA: Diagnosis not present

## 2023-07-31 MED ORDER — CYANOCOBALAMIN 1000 MCG PO TABS: 1000.0000 ug | ORAL_TABLET | Freq: Every day | ORAL | Status: AC

## 2023-07-31 MED ORDER — FOLIC ACID 1 MG PO TABS: 1.0000 mg | ORAL_TABLET | Freq: Every day | ORAL | Status: AC

## 2023-07-31 MED ORDER — CEFADROXIL 500 MG PO CAPS: 500.0000 mg | ORAL_CAPSULE | Freq: Two times a day (BID) | ORAL | Status: AC

## 2023-07-31 MED ORDER — SENNOSIDES-DOCUSATE SODIUM 8.6-50 MG PO TABS: 1.0000 | ORAL_TABLET | Freq: Every evening | ORAL | Status: AC | PRN

## 2023-07-31 MED ORDER — FERROUS SULFATE 325 (65 FE) MG PO TABS: 325.0000 mg | ORAL_TABLET | Freq: Every day | ORAL | Status: AC

## 2023-07-31 NOTE — TOC Progression Note (Addendum)
 Transition of Care [facility] - Progression Note    Patient Details  Name: ARACELI ARANGO MRN: [id number] Date of Birth: [birth year]  [facility]) CM/SW Contact  Tawni CHRISTELLA Eva, LCSW Phone Number: 07/31/2023, 12:11 PM  Clinical Narrative:    CSW spoke with pt's grandson Selinda , he is still reviewed bed offers and will call this CSW back today with his choice. TOC to follow.    Adden  1:00pm  CSW received call back from pt's grandson he has chosen [facility] for his grandmother's Rehab placement.   1:30pm CSW spoke with Kia who stated she will need to run pt's insurance and then give CSW  call back TOC to follow.   Expected Discharge Plan: Skilled Nursing Facility Barriers to Discharge: Continued Medical Work up  Expected Discharge Plan and Services       Living arrangements for the past 2 months: Single Family Home                                       Social Determinants of Health (SDOH) Interventions SDOH Screenings   Food Insecurity: No Food Insecurity (07/28/2023)  Housing: Low Risk  (07/28/2023)  Transportation Needs: No Transportation Needs (07/28/2023)  Utilities: Not At Risk (07/28/2023)  Social Connections: Socially Integrated (07/28/2023)  Tobacco Use: Low Risk  (07/27/2023)    Readmission Risk Interventions     No data to display

## 2023-07-31 NOTE — Progress Notes (Signed)
 PROGRESS NOTE VEIDA SPIRA  FMW:[id number] DOB: 05/17/1935 DOA: 07/27/2023 PCP: Yolande Toribio MATSU, MD  Brief Narrative/Hospital Course: 89 y/o female with medical history significant for dementia, CKD stage IIIa, HTN, HLD, hypothyroidism, obesity presented from home with weakness increased confusion from baseline-who is admitted with cellulitis of the buttocks and generalized weakness.  She does have advanced dementia, has chronic swelling edema of her lower extremities recently having left ankle pain unable to see PCP due to difficulty with transport, at baseline she cannot stand up on her own and with some effort can walk a little bit with use of walker. In the ED low-grade temperature,Labs show WBC 11.5, hemoglobin 8.6, platelets 317,000, sodium 133, potassium 4.2, bicarb 21, BUN 23, creatinine 1.07, serum glucose 107, LFTs within normal limits, CK4 147.  TSH 13.470, valproate level in process.  Lactic acid 1.7.  UA negative for UTI.  Blood cultures in process.  SARS-CoV-2, influenza, [place] are negative. CT head without contrast negative for acute intracranial process.  Mild diffuse atrophy and moderate chronic small vessel ischemic changes noted. CXR> negative for focal consolidation, Dema, effusion. Pelvic x-ray negative for evidence of pelvic fractures or diastases.  Degenerative changes noted. Patient was given IV vancomycin  and cefepime  the hospitalist service was consulted to admit for further evaluation and management. Patient remains deconditioned and weak PT OT recommending skilled nursing facility At this time waiting for placement    Subjective: Patient seen and examined Resting comfortably alert awake oriented place people and not to date Conversant follows commands appropriately Overnight afebrile BP well-controlled.    Assessment and Plan: Principal Problem:   Cellulitis of buttock Active Problems:   Generalized weakness   Essential hypertension   Hypothyroidism    Dementia (HCC)   Chronic kidney disease, stage 3a (HCC)   Anemia   Elevated CK   Cellulitis of the buttocks: Patient having mild cellulitis with several excoriation/stage I  pressure wound, with mild leukocytosis, leukocytosis improved.  Continue local wound care, antibiotics Ancef > switch to p.o. upon discharge  Generalized weakness/debility/deconditioning Patient had worsening weakness from baseline chronic edematous changes on both legs.  Duplex negative for DVT x-ray no acute finding.  Continue PT OT and planning for skilled nursing facility  Mild rhabdomyolysis : CK improved 632>283. Encourage oral intake.    Normocytic anemia Borderline B12: Likely multifactorial with iron deficiency, chronic disease.  Anemia panel down.  Continue iron supplementation B12 supplementation trend hb Recent Labs    07/27/23 2055 07/28/23 0347  HGB 8.6* 8.2*  MCV 79.3* 79.8*  VITAMINB12  --  181  FOLATE  --  21.8  FERRITIN  --  7*  TIBC  --  486*  IRON  --  29    CKD stage IIIa: Renal function remains stable     Delirium/encephalopathy superimposed on background of dementia: She is alert awake pleasantly confused with baseline dementia follows commands.  Continue home Depakote  Aricept  Seroquel  awaiting on placement   Hypothyroidism: TSH is 13.470. FT4 normal 0.9 continue home Synthroid     Hypertension: BP is stable.   DVT prophylaxis: enoxaparin  (LOVENOX ) injection 40 mg Start: 07/28/23 1000 Code Status:   Code Status: Limited: Do not attempt resuscitation (DNR) -DNR-LIMITED -Do Not Intubate/DNI  Family Communication: plan of care discussed with patient/none at bedside. 1/8 tried calling her husband and daughter no answer. TOC has reached out to family for placement Patient status is: Remains hospitalized because of severity of illness Level of care: Med-Surg   Dispo: The patient is  from: home w/ husband            Anticipated disposition:awaiting SNF.   Objective: Vitals last 24  hrs: Vitals:   07/30/23 0538 07/30/23 1400 07/30/23 2127 07/31/23 0549  BP: (!) 125/53 (!) 120/58 (!) 132/54 (!) 133/59  Pulse: 76 68 70 73  Resp: 18 15 18 15   Temp: 97.6 F (36.4 C) 98 F (36.7 C) 97.9 F (36.6 C) 98.2 F (36.8 C)  TempSrc:   Oral Oral  SpO2: 99% 95% 100% 97%  Weight:      Height:       Weight change:   Physical Examination: General exam: alert awake, oriented x 2 HEENT:Oral mucosa moist, Ear/Nose WNL grossly Respiratory system: Bilaterally clear BS,no use of accessory muscle Cardiovascular system: S1 & S2 +, No JVD. Gastrointestinal system: Abdomen soft,NT,ND, BS+ Nervous System: Alert, awake, moving all extremities,and following commands. Extremities: LE edema neg,distal peripheral pulses palpable and warm.  Skin: No rashes,no icterus. MSK: Normal muscle bulk,tone, power   Medications reviewed:  Scheduled Meds:  amLODipine   5 mg Oral Daily   aspirin  EC  81 mg Oral Daily   vitamin B-12  1,000 mcg Oral Daily   divalproex   125 mg Oral Daily   donepezil   10 mg Oral QHS   enoxaparin  (LOVENOX ) injection  40 mg Subcutaneous Q24H   ferrous sulfate   325 mg Oral Q breakfast   folic acid   1 mg Oral Daily   Gerhardt's butt cream   Topical TID   levothyroxine   150 mcg Oral Q0600   metoprolol  succinate  50 mg Oral Daily   multivitamin with minerals  1 tablet Oral Daily   pyridOXINE   100 mg Oral Daily   QUEtiapine   200 mg Oral QHS   Continuous Infusions:   ceFAZolin  (ANCEF ) IV 1 g (07/31/23 9177)     Diet Order             Diet Heart Room service appropriate? Yes; Fluid consistency: Thin  Diet effective now                  Intake/Output Summary (Last 24 hours) at 07/31/2023 1050 Last data filed at 07/31/2023 0900 Gross per 24 hour  Intake 840 ml  Output 450 ml  Net 390 ml   Net IO Since Admission: 155 mL [07/31/23 1050]  Wt Readings from Last 3 Encounters:  07/27/23 85 kg  02/08/19 85.3 kg  04/27/18 88.5 kg     Unresulted Labs (From  admission, onward)    None      Data Reviewed: I have personally reviewed following labs and imaging studies CBC: Recent Labs  Lab 07/27/23 2055 07/28/23 0347  WBC 11.5* 10.8*  NEUTROABS 8.3*  --   HGB 8.6* 8.2*  HCT 29.1* 28.0*  MCV 79.3* 79.8*  PLT 317 292   Basic Metabolic Panel:  Recent Labs  Lab 07/27/23 2055 07/28/23 0347  NA 133* 130*  K 4.2 4.1  CL 101 98  CO2 21* 21*  GLUCOSE 107* 96  BUN 23 20  CREATININE 1.07* 0.93  CALCIUM  8.5* 8.1*   GFR: Estimated Creatinine Clearance: 48.7 mL/min (by C-G formula based on SCr of 0.93 mg/dL). Liver Function Tests:  Recent Labs  Lab 07/27/23 2055  AST 34  ALT 15  ALKPHOS 64  BILITOT 0.6  PROT 7.1  ALBUMIN 3.6   No results for input(s): TSH, T4TOTAL, FREET4, T3FREE, THYROIDAB in the last 72 hours.  Sepsis Labs: Recent Labs  Lab  07/27/23 2216  LATICACIDVEN 1.7   Recent Results (from the past 240 hours)  Resp panel by RT-PCR (RSV, Flu A&B, Covid) Anterior Nasal Swab     Status: None   Collection Time: 07/27/23  8:55 PM   Specimen: Anterior Nasal Swab  Result Value Ref Range Status   SARS Coronavirus 2 by RT PCR NEGATIVE NEGATIVE Final    Comment: (NOTE) SARS-CoV-2 target nucleic acids are NOT DETECTED.  The SARS-CoV-2 RNA is generally detectable in upper respiratory specimens during the acute phase of infection. The lowest concentration of SARS-CoV-2 viral copies this assay can detect is 138 copies/mL. A negative result does not preclude SARS-Cov-2 infection and should not be used as the sole basis for treatment or other patient management decisions. A negative result may occur with  improper specimen collection/handling, submission of specimen other than nasopharyngeal swab, presence of viral mutation(s) within the areas targeted by this assay, and inadequate number of viral copies(<138 copies/mL). A negative result must be combined with clinical observations, patient history, and  epidemiological information. The expected result is Negative.  Fact Sheet for Patients:  bloggercourse.com  Fact Sheet for Healthcare Providers:  seriousbroker.it  This test is no t yet approved or cleared by the [place]  FDA and  has been authorized for detection and/or diagnosis of SARS-CoV-2 by FDA under an Emergency Use Authorization (EUA). This EUA will remain  in effect (meaning this test can be used) for the duration of the COVID-19 declaration under Section 564(b)(1) of the Act, 21 U.S.C.section 360bbb-3(b)(1), unless the authorization is terminated  or revoked sooner.       Influenza A by PCR NEGATIVE NEGATIVE Final   Influenza B by PCR NEGATIVE NEGATIVE Final    Comment: (NOTE) The Xpert Xpress SARS-CoV-2/FLU/RSV plus assay is intended as an aid in the diagnosis of influenza from Nasopharyngeal swab specimens and should not be used as a sole basis for treatment. Nasal washings and aspirates are unacceptable for Xpert Xpress SARS-CoV-2/FLU/RSV testing.  Fact Sheet for Patients: bloggercourse.com  Fact Sheet for Healthcare Providers: seriousbroker.it  This test is not yet approved or cleared by the [place]  FDA and has been authorized for detection and/or diagnosis of SARS-CoV-2 by FDA under an Emergency Use Authorization (EUA). This EUA will remain in effect (meaning this test can be used) for the duration of the COVID-19 declaration under Section 564(b)(1) of the Act, 21 U.S.C. section 360bbb-3(b)(1), unless the authorization is terminated or revoked.     Resp Syncytial Virus by PCR NEGATIVE NEGATIVE Final    Comment: (NOTE) Fact Sheet for Patients: bloggercourse.com  Fact Sheet for Healthcare Providers: seriousbroker.it  This test is not yet approved or cleared by the [place]  FDA and has been  authorized for detection and/or diagnosis of SARS-CoV-2 by FDA under an Emergency Use Authorization (EUA). This EUA will remain in effect (meaning this test can be used) for the duration of the COVID-19 declaration under Section 564(b)(1) of the Act, 21 U.S.C. section 360bbb-3(b)(1), unless the authorization is terminated or revoked.  Performed at [facility], [address] [place], [place] [address]   Blood culture (routine x 2)     Status: None (Preliminary result)   Collection Time: 07/27/23 10:00 PM   Specimen: BLOOD  Result Value Ref Range Status   Specimen Description   Final    BLOOD RIGHT ANTECUBITAL Performed at [facility], [address] [place], [place] [address]    Special Requests   Final  BOTTLES DRAWN AEROBIC AND ANAEROBIC Blood Culture results may not be optimal due to an inadequate volume of blood received in culture bottles Performed at [facility], [address], [place], [place] [address]    Culture   Final    NO GROWTH 3 DAYS Performed at [facility], [address], [place], [place] [address]    Report Status PENDING  Incomplete  Blood culture (routine x 2)     Status: None (Preliminary result)   Collection Time: 07/27/23 10:14 PM   Specimen: BLOOD RIGHT FOREARM  Result Value Ref Range Status   Specimen Description   Final    BLOOD RIGHT FOREARM Performed at [facility], [address] [place], [place] [address]    Special Requests   Final    BOTTLES DRAWN AEROBIC AND ANAEROBIC Blood Culture adequate volume Performed at [facility], [address] [place], [place] [address]    Culture   Final    NO GROWTH 3 DAYS Performed at [facility], [address], [place], [place] [address]    Report Status PENDING  Incomplete    Antimicrobials/Microbiology: Anti-infectives (From admission, onward)    Start     Dose/Rate Route Frequency  Ordered Stop   07/28/23 0800  ceFAZolin  (ANCEF ) IVPB 1 g/50 mL premix        1 g 100 mL/hr over 30 Minutes Intravenous Every 8 hours 07/27/23 2359 08/04/23 0759   07/27/23 2145  vancomycin  (VANCOCIN ) IVPB 1000 mg/200 mL premix        1,000 mg 200 mL/hr over 60 Minutes Intravenous  Once 07/27/23 2131 07/28/23 0030   07/27/23 2145  ceFEPIme  (MAXIPIME ) 2 g in sodium chloride  0.9 % 100 mL IVPB        2 g 200 mL/hr over 30 Minutes Intravenous  Once 07/27/23 2133 07/27/23 2332         Component Value Date/Time   SDES  07/27/2023 2214    BLOOD RIGHT FOREARM Performed at [facility], [address], [place], [place] [address]    SPECREQUEST  07/27/2023 2214    BOTTLES DRAWN AEROBIC AND ANAEROBIC Blood Culture adequate volume Performed at [facility], [address], [place], [place] [address]    CULT  07/27/2023 2214    NO GROWTH 3 DAYS Performed at [facility], [address], [place], [place] [address]    REPTSTATUS PENDING 07/27/2023 2214     Radiology Studies: VAS US  LOWER EXTREMITY VENOUS (DVT) Result Date: 07/30/2023  Lower Venous DVT Study Patient Name:  SYMPHONY DEMURO  Date of Exam:   07/30/2023 Medical Rec #: [id number]        Accession #:    [id number] Date of Birth: 04-08-1935        Patient Gender: F Patient Age:   55 years Exam Location:  [facility] Procedure:      VAS US  LOWER EXTREMITY VENOUS (DVT) Referring Phys: Rashed Edler --------------------------------------------------------------------------------  Indications: Edema, and Swelling.  Risk Factors: Past pregnancy and obesity. Limitations: Body habitus and poor ultrasound/tissue interface. Performing Technologist: Garnette Rockers  Examination Guidelines: A complete evaluation includes B-mode imaging, spectral Doppler, color Doppler, and power Doppler as needed of all accessible portions of each vessel. Bilateral testing is considered an integral part of a complete  examination. Limited examinations for reoccurring indications may be performed as noted. The reflux portion of the exam is performed with the  patient in reverse Trendelenburg.  +-----+---------------+---------+-----------+----------+--------------+ RIGHTCompressibilityPhasicitySpontaneityPropertiesThrombus Aging +-----+---------------+---------+-----------+----------+--------------+ CFV  Full           Yes      Yes                                 +-----+---------------+---------+-----------+----------+--------------+   +---------+---------------+---------+-----------+----------+-------------------+ LEFT     CompressibilityPhasicitySpontaneityPropertiesThrombus Aging      +---------+---------------+---------+-----------+----------+-------------------+ CFV      Full           Yes      Yes                                      +---------+---------------+---------+-----------+----------+-------------------+ SFJ      Full                                                             +---------+---------------+---------+-----------+----------+-------------------+ FV Prox  Full                                                             +---------+---------------+---------+-----------+----------+-------------------+ FV Mid   Full                                                             +---------+---------------+---------+-----------+----------+-------------------+ FV Distal                                             Not well visualized +---------+---------------+---------+-----------+----------+-------------------+ PFV      Full                                                             +---------+---------------+---------+-----------+----------+-------------------+ POP      Full           Yes      Yes                                      +---------+---------------+---------+-----------+----------+-------------------+ PTV                                                    Not well visualized +---------+---------------+---------+-----------+----------+-------------------+ PERO  Not well visualized +---------+---------------+---------+-----------+----------+-------------------+ Avascular fluid mass seen behind left knee measuring 3.16 x 1.68 x 2.43 cm.   Summary: RIGHT: - No evidence of common femoral vein obstruction.   LEFT: - There is no evidence of deep vein thrombosis in the lower extremity.  - A cystic structure is found in the popliteal fossa.  *See table(s) above for measurements and observations. Electronically signed by Penne Colorado MD on 07/30/2023 at 7:16:37 PM.    Final       LOS: 4 days   Total time spent in review of labs and imaging, patient evaluation, formulation of plan, documentation and communication with family: 35 minutes  Mennie LAMY, MD [facility]  07/31/2023, 10:50 AM

## 2023-07-31 NOTE — TOC Transition Note (Signed)
 Transition of Care [facility]) - Discharge Note   Patient Details  Name: Rachael Carpenter MRN: [id number] Date of Birth: 05-24-1935  Transition of Care [facility]) CM/SW Contact:  Tawni CHRISTELLA Eva, LCSW Phone Number: 07/31/2023, 2:30 PM   Clinical Narrative:    Pt to d/c to [facility] , pt's room 125B, RN to call report to [phone]. CSW spoke with pt's grandson selinda and he agrees with d/c plan. PTAR called ,TOC sign off.      Barriers to Discharge: Continued Medical Work up   Patient Goals and CMS Choice Patient states their goals for this hospitalization and ongoing recovery are:: return home          Discharge Placement                       Discharge Plan and Services Additional resources added to the After Visit Summary for                                       Social Drivers of Health (SDOH) Interventions SDOH Screenings   Food Insecurity: No Food Insecurity (07/28/2023)  Housing: Low Risk  (07/28/2023)  Transportation Needs: No Transportation Needs (07/28/2023)  Utilities: Not At Risk (07/28/2023)  Social Connections: Socially Integrated (07/28/2023)  Tobacco Use: Low Risk  (07/27/2023)     Readmission Risk Interventions     No data to display

## 2023-07-31 NOTE — Discharge Summary (Signed)
 Physician Discharge Summary  Rachael Carpenter FMW:[id number] DOB: Jun 13, 1935 DOA: 07/27/2023  PCP: Yolande Toribio MATSU, MD  Admit date: 07/27/2023 Discharge date: 07/31/2023 Recommendations for Outpatient Follow-up:  Follow up with PCP in 1 weeks-call for appointment Check Depakote  level in 1 week and discuss with PCP to adjust the dose if still subtherapeutic. Please obtain BMP/CBC in one week  Discharge Dispo: SNF Discharge Condition: Stable Code Status:   Code Status: Limited: Do not attempt resuscitation (DNR) -DNR-LIMITED -Do Not Intubate/DNI  Diet recommendation:  Diet Order             Diet Heart Room service appropriate? Yes; Fluid consistency: Thin  Diet effective now                    Brief/Interim Summary: 89 y/o female with medical history significant for dementia, CKD stage IIIa, HTN, HLD, hypothyroidism, obesity presented from home with weakness increased confusion from baseline-who is admitted with cellulitis of the buttocks and generalized weakness.  She does have advanced dementia, has chronic swelling edema of her lower extremities recently having left ankle pain unable to see PCP due to difficulty with transport, at baseline she cannot stand up on her own and with some effort can walk a little bit with use of walker. In the ED low-grade temperature,Labs show WBC 11.5, hemoglobin 8.6, platelets 317,000, sodium 133, potassium 4.2, bicarb 21, BUN 23, creatinine 1.07, serum glucose 107, LFTs within normal limits, CK4 147.  TSH 13.470, valproate level in process.  Lactic acid 1.7.  UA negative for UTI.  Blood cultures in process.  SARS-CoV-2, influenza, [place] are negative. CT head without contrast negative for acute intracranial process.  Mild diffuse atrophy and moderate chronic small vessel ischemic changes noted. CXR> negative for focal consolidation, Dema, effusion. Pelvic x-ray negative for evidence of pelvic fractures or diastases.  Degenerative changes  noted. Patient was given IV vancomycin  and cefepime  the hospitalist service was consulted to admit for further evaluation and management. Patient remains deconditioned and weak PT OT recommending skilled nursing facility At this time waiting for placement and has been approved on 07/31/23     Discharge Diagnoses:  Principal Problem:   Cellulitis of buttock Active Problems:   Generalized weakness   Essential hypertension   Hypothyroidism   Dementia (HCC)   Chronic kidney disease, stage 3a (HCC)   Anemia   Elevated CK  Cellulitis of the buttocks: Patient having mild cellulitis with several excoriation/stage I  pressure wound, with mild leukocytosis, leukocytosis improved.  Continue local wound care, antibiotics Ancef > switch to p.o. upon discharge   Generalized weakness/debility/deconditioning Patient had worsening weakness from baseline chronic edematous changes on both legs.  Duplex negative for DVT x-ray no acute finding.  Continue PT OT and planning for skilled nursing facility   Mild rhabdomyolysis : CK improved 632>283. Encourage oral intake.     Normocytic anemia Borderline B12: Likely multifactorial with iron deficiency, chronic disease.  Anemia panel down.  Continue iron supplementation B12   CKD stage IIIa: Renal function remains stable     Delirium/encephalopathy superimposed on background of dementia: She is alert awake pleasantly confused with baseline dementia follows commands.  Continue home Depakote  Aricept  Seroquel  =-follow-up with PCP if Depakote  level still low in 1 week low   Hypothyroidism: TSH is 13.470. FT4 normal 0.9 continue home Synthroid     Hypertension: BP is stable.  Consults: none Subjective: Alert oriented pleasantly confused  Discharge Exam: Vitals:   07/31/23 0549 07/31/23 1247  BP: (!) 133/59 (!) 123/55  Pulse: 73 67  Resp: 15 16  Temp: 98.2 F (36.8 C) 98 F (36.7 C)  SpO2: 97% 98%   General: Pt is alert, awake, not in acute  distress Cardiovascular: RRR, S1/S2 +, no rubs, no gallops Respiratory: CTA bilaterally, no wheezing, no rhonchi Abdominal: Soft, NT, ND, bowel sounds + Extremities: no edema, no cyanosis  Discharge Instructions  Discharge Instructions     Discharge instructions   Complete by: As directed    Please call call MD or return to ER for similar or worsening recurring problem that brought you to hospital or if any fever,nausea/vomiting,abdominal pain, uncontrolled pain, chest pain,  shortness of breath or any other alarming symptoms.  Please follow-up your doctor as instructed in a week time and call the office for appointment.  Please avoid alcohol , smoking, or any other illicit substance and maintain healthy habits including taking your regular medications as prescribed.  You were cared for by a hospitalist during your hospital stay. If you have any questions about your discharge medications or the care you received while you were in the hospital after you are discharged, you can call the unit and ask to speak with the hospitalist on call if the hospitalist that took care of you is not available.  Once you are discharged, your primary care physician will handle any further medical issues. Please note that NO REFILLS for any discharge medications will be authorized once you are discharged, as it is imperative that you return to your primary care physician (or establish a relationship with a primary care physician if you do not have one) for your aftercare needs so that they can reassess your need for medications and monitor your lab values   Discharge wound care:   Complete by: As directed    Cleanse sacrum/buttocks/upper posterior thighs with Vashe wound cleanser [name] [phone]), apply Gerhardt's butt cream 3 times a day and prn soiling.  May sprinkle over Gerhardt's with floor stock antifungal powder (Microguard white and green label).   Increase activity slowly   Complete by: As directed        Allergies as of 07/31/2023       Reactions   Atorvastatin  Other (See Comments)   Patient's husband is unaware she is sensitive to this   Fluoxetine Other (See Comments)   Headaches (Patient's husband is unaware she is sensitive to this)   Penicillins Swelling   Allergic, per husband        Medication List     TAKE these medications    amLODipine  5 MG tablet Commonly known as: NORVASC  Take 5 mg by mouth daily.   aspirin  EC 81 MG tablet Take 81 mg by mouth daily.   cefadroxil  500 MG capsule Commonly known as: DURICEF Take 1 capsule (500 mg total) by mouth 2 (two) times daily for 4 days.   cyanocobalamin  1000 MCG tablet Take 1 tablet (1,000 mcg total) by mouth daily. Start taking on: August 01, 2023   divalproex  125 MG capsule Commonly known as: DEPAKOTE  SPRINKLE Take 125 mg by mouth daily.   donepezil  10 MG tablet Commonly known as: ARICEPT  Take 1 tablet (10 mg total) by mouth at bedtime.   ferrous sulfate  325 (65 FE) MG tablet Take 1 tablet (325 mg total) by mouth daily with breakfast. Start taking on: August 01, 2023   folic acid  1 MG tablet Commonly known as: FOLVITE  Take 1 tablet (1 mg total) by mouth daily.   levothyroxine   150 MCG tablet Commonly known as: SYNTHROID  Take 150 mcg by mouth daily before breakfast.   metoprolol  succinate 50 MG 24 hr tablet Commonly known as: TOPROL -XL Take 50 mg by mouth daily.   Multivitamin Adults Tabs Take 1 tablet by mouth daily with breakfast.   QUEtiapine  100 MG tablet Commonly known as: SEROQUEL  Take 200 mg by mouth at bedtime.   senna-docusate 8.6-50 MG tablet Commonly known as: Senokot-S Take 1 tablet by mouth at bedtime as needed for mild constipation.               Discharge Care Instructions  (From admission, onward)           Start     Ordered   07/31/23 0000  Discharge wound care:       Comments: Cleanse sacrum/buttocks/upper posterior thighs with Vashe wound cleanser [name]  [phone]), apply Gerhardt's butt cream 3 times a day and prn soiling.  May sprinkle over Gerhardt's with floor stock antifungal powder (Microguard white and green label).   07/31/23 1404            Contact information for follow-up providers     Yolande Toribio MATSU, MD Follow up in 1 week(s).   Specialty: Internal Medicine Contact information: [address] [place] [address] [phone]              Contact information for after-discharge care     Destination     [facility] Preferred SNF .   Service: Skilled Nursing Contact information: [address] [place]  [address] [phone]                    Allergies  Allergen Reactions   Atorvastatin  Other (See Comments)    Patient's husband is unaware she is sensitive to this   Fluoxetine Other (See Comments)    Headaches (Patient's husband is unaware she is sensitive to this)   Penicillins Swelling    Allergic, per husband    The results of significant diagnostics from this hospitalization (including imaging, microbiology, ancillary and laboratory) are listed below for reference.    Microbiology: Recent Results (from the past 240 hours)  Resp panel by RT-PCR (RSV, Flu A&B, Covid) Anterior Nasal Swab     Status: None   Collection Time: 07/27/23  8:55 PM   Specimen: Anterior Nasal Swab  Result Value Ref Range Status   SARS Coronavirus 2 by RT PCR NEGATIVE NEGATIVE Final    Comment: (NOTE) SARS-CoV-2 target nucleic acids are NOT DETECTED.  The SARS-CoV-2 RNA is generally detectable in upper respiratory specimens during the acute phase of infection. The lowest concentration of SARS-CoV-2 viral copies this assay can detect is 138 copies/mL. A negative result does not preclude SARS-Cov-2 infection and should not be used as the sole basis for treatment or other patient management decisions. A negative result may occur with  improper specimen collection/handling,  submission of specimen other than nasopharyngeal swab, presence of viral mutation(s) within the areas targeted by this assay, and inadequate number of viral copies(<138 copies/mL). A negative result must be combined with clinical observations, patient history, and epidemiological information. The expected result is Negative.  Fact Sheet for Patients:  bloggercourse.com  Fact Sheet for Healthcare Providers:  seriousbroker.it  This test is no t yet approved or cleared by the [place]  FDA and  has been authorized for detection and/or diagnosis of SARS-CoV-2 by FDA under an Emergency Use Authorization (EUA). This EUA will remain  in effect (meaning  this test can be used) for the duration of the COVID-19 declaration under Section 564(b)(1) of the Act, 21 U.S.C.section 360bbb-3(b)(1), unless the authorization is terminated  or revoked sooner.       Influenza A by PCR NEGATIVE NEGATIVE Final   Influenza B by PCR NEGATIVE NEGATIVE Final    Comment: (NOTE) The Xpert Xpress SARS-CoV-2/FLU/RSV plus assay is intended as an aid in the diagnosis of influenza from Nasopharyngeal swab specimens and should not be used as a sole basis for treatment. Nasal washings and aspirates are unacceptable for Xpert Xpress SARS-CoV-2/FLU/RSV testing.  Fact Sheet for Patients: bloggercourse.com  Fact Sheet for Healthcare Providers: seriousbroker.it  This test is not yet approved or cleared by the [place]  FDA and has been authorized for detection and/or diagnosis of SARS-CoV-2 by FDA under an Emergency Use Authorization (EUA). This EUA will remain in effect (meaning this test can be used) for the duration of the COVID-19 declaration under Section 564(b)(1) of the Act, 21 U.S.C. section 360bbb-3(b)(1), unless the authorization is terminated or revoked.     Resp Syncytial Virus by PCR NEGATIVE  NEGATIVE Final    Comment: (NOTE) Fact Sheet for Patients: bloggercourse.com  Fact Sheet for Healthcare Providers: seriousbroker.it  This test is not yet approved or cleared by the [place]  FDA and has been authorized for detection and/or diagnosis of SARS-CoV-2 by FDA under an Emergency Use Authorization (EUA). This EUA will remain in effect (meaning this test can be used) for the duration of the COVID-19 declaration under Section 564(b)(1) of the Act, 21 U.S.C. section 360bbb-3(b)(1), unless the authorization is terminated or revoked.  Performed at [facility], [address], [place], [place] [address]   Blood culture (routine x 2)     Status: None (Preliminary result)   Collection Time: 07/27/23 10:00 PM   Specimen: BLOOD  Result Value Ref Range Status   Specimen Description   Final    BLOOD RIGHT ANTECUBITAL Performed at [facility], [address] [place], [place] [address]    Special Requests   Final    BOTTLES DRAWN AEROBIC AND ANAEROBIC Blood Culture results may not be optimal due to an inadequate volume of blood received in culture bottles Performed at [facility], [address], [place], [place] [address]    Culture   Final    NO GROWTH 3 DAYS Performed at [facility], [address] [place], [place] [address]    Report Status PENDING  Incomplete  Blood culture (routine x 2)     Status: None (Preliminary result)   Collection Time: 07/27/23 10:14 PM   Specimen: BLOOD RIGHT FOREARM  Result Value Ref Range Status   Specimen Description   Final    BLOOD RIGHT FOREARM Performed at [facility], [address], [place], [place] [address]    Special Requests   Final    BOTTLES DRAWN AEROBIC AND ANAEROBIC Blood Culture adequate volume Performed at [facility], [address] [place], [place] [address]     Culture   Final    NO GROWTH 3 DAYS Performed at [facility], [address], [place], [place] [address]    Report Status PENDING  Incomplete    Procedures/Studies: VAS US  LOWER EXTREMITY VENOUS (DVT) Result Date: 07/30/2023  Lower Venous DVT Study Patient Name:  Rachael Carpenter  Date of Exam:   07/30/2023 Medical Rec #: [id number]  Accession #:    [id number] Date of Birth: 23-May-1935        Patient Gender: F Patient Age:   4 years Exam Location:  [facility] Procedure:      VAS US  LOWER EXTREMITY VENOUS (DVT) Referring Phys: Trejan Buda --------------------------------------------------------------------------------  Indications: Edema, and Swelling.  Risk Factors: Past pregnancy and obesity. Limitations: Body habitus and poor ultrasound/tissue interface. Performing Technologist: Garnette Rockers  Examination Guidelines: A complete evaluation includes B-mode imaging, spectral Doppler, color Doppler, and power Doppler as needed of all accessible portions of each vessel. Bilateral testing is considered an integral part of a complete examination. Limited examinations for reoccurring indications may be performed as noted. The reflux portion of the exam is performed with the patient in reverse Trendelenburg.  +-----+---------------+---------+-----------+----------+--------------+ RIGHTCompressibilityPhasicitySpontaneityPropertiesThrombus Aging +-----+---------------+---------+-----------+----------+--------------+ CFV  Full           Yes      Yes                                 +-----+---------------+---------+-----------+----------+--------------+   +---------+---------------+---------+-----------+----------+-------------------+ LEFT     CompressibilityPhasicitySpontaneityPropertiesThrombus Aging      +---------+---------------+---------+-----------+----------+-------------------+ CFV      Full           Yes      Yes                                       +---------+---------------+---------+-----------+----------+-------------------+ SFJ      Full                                                             +---------+---------------+---------+-----------+----------+-------------------+ FV Prox  Full                                                             +---------+---------------+---------+-----------+----------+-------------------+ FV Mid   Full                                                             +---------+---------------+---------+-----------+----------+-------------------+ FV Distal                                             Not well visualized +---------+---------------+---------+-----------+----------+-------------------+ PFV      Full                                                             +---------+---------------+---------+-----------+----------+-------------------+ POP      Full  Yes      Yes                                      +---------+---------------+---------+-----------+----------+-------------------+ PTV                                                   Not well visualized +---------+---------------+---------+-----------+----------+-------------------+ PERO                                                  Not well visualized +---------+---------------+---------+-----------+----------+-------------------+ Avascular fluid mass seen behind left knee measuring 3.16 x 1.68 x 2.43 cm.   Summary: RIGHT: - No evidence of common femoral vein obstruction.   LEFT: - There is no evidence of deep vein thrombosis in the lower extremity.  - A cystic structure is found in the popliteal fossa.  *See table(s) above for measurements and observations. Electronically signed by Penne Colorado MD on 07/30/2023 at 7:16:37 PM.    Final    DG Ankle 2 Views Left Result Date: 07/28/2023 CLINICAL DATA:  Left ankle pain EXAM: LEFT ANKLE - 2 VIEW COMPARISON:  None Available. FINDINGS: No acute  bony abnormality. Specifically, no fracture, subluxation, or dislocation. Small plantar calcaneal spur. Diffuse soft tissue swelling. IMPRESSION: No acute bony abnormality. Electronically Signed   By: Franky Crease M.D.   On: 07/28/2023 00:19   CT HEAD WO CONTRAST ( ) Result Date: 07/27/2023 CLINICAL DATA:  Mental status change EXAM: CT HEAD WITHOUT CONTRAST TECHNIQUE: Contiguous axial images were obtained from the base of the skull through the vertex without intravenous contrast. RADIATION DOSE REDUCTION: This exam was performed according to the departmental dose-optimization program which includes automated exposure control, adjustment of the mA and/or kV according to patient size and/or use of iterative reconstruction technique. COMPARISON:  MRI brain 03/05/2018. FINDINGS: Brain: No evidence of acute infarction, hemorrhage, hydrocephalus, extra-axial collection or mass lesion/mass effect. There is mild diffuse atrophy and moderate periventricular white matter hypodensity, similar to the prior study. Vascular: Atherosclerotic calcifications are present within the cavernous internal carotid arteries. Skull: Normal. Negative for fracture or focal lesion. Sinuses/Orbits: No acute finding. Other: None. IMPRESSION: 1. No acute intracranial process. 2. Mild diffuse atrophy and moderate chronic small vessel ischemic changes. Electronically Signed   By: Greig Pique M.D.   On: 07/27/2023 22:28   DG Chest Port 1 View Result Date: 07/27/2023 CLINICAL DATA:  Fall injury.  Chronic kidney disease. EXAM: PORTABLE PELVIS 1-2 VIEWS; PORTABLE CHEST - 1 VIEW COMPARISON:  Portable chest 02/17/2019, flat plate abdomen view including the pelvis 02/20/2018. FINDINGS: Chest AP portable 8:44 p.m.: Mild cardiomegaly. No evidence of CHF. There is mild aortic tortuosity with stable mediastinum. The lungs are clear. There is no measurable pleural effusion or pneumothorax. Osteopenia and thoracic spondylosis. No acute regional skeletal  findings. Portable AP pelvis single view: Mild osteopenia. No evidence of pelvic fractures or diastasis. No AP evidence of fractures of the visualized proximal femurs. There is asymmetric moderate right hip arthrosis, mild degenerative change left hip, spurring of the symphysis pubis and SI joints. There is facet hypertrophy in the visualized lower lumbar spine. Soft  tissues are unremarkable. IMPRESSION: 1. No evidence of acute chest disease. Mild cardiomegaly. 2. No evidence of pelvic fractures or diastasis. 3. Degenerative changes. Electronically Signed   By: Francis Quam M.D.   On: 07/27/2023 21:14   DG Pelvis Portable Result Date: 07/27/2023 CLINICAL DATA:  Fall injury.  Chronic kidney disease. EXAM: PORTABLE PELVIS 1-2 VIEWS; PORTABLE CHEST - 1 VIEW COMPARISON:  Portable chest 02/17/2019, flat plate abdomen view including the pelvis 02/20/2018. FINDINGS: Chest AP portable 8:44 p.m.: Mild cardiomegaly. No evidence of CHF. There is mild aortic tortuosity with stable mediastinum. The lungs are clear. There is no measurable pleural effusion or pneumothorax. Osteopenia and thoracic spondylosis. No acute regional skeletal findings. Portable AP pelvis single view: Mild osteopenia. No evidence of pelvic fractures or diastasis. No AP evidence of fractures of the visualized proximal femurs. There is asymmetric moderate right hip arthrosis, mild degenerative change left hip, spurring of the symphysis pubis and SI joints. There is facet hypertrophy in the visualized lower lumbar spine. Soft tissues are unremarkable. IMPRESSION: 1. No evidence of acute chest disease. Mild cardiomegaly. 2. No evidence of pelvic fractures or diastasis. 3. Degenerative changes. Electronically Signed   By: Francis Quam M.D.   On: 07/27/2023 21:14    Labs: BNP (last 3 results) No results for input(s): BNP in the last 8760 hours. Basic Metabolic Panel: Recent Labs  Lab 07/27/23 2055 07/28/23 0347  NA 133* 130*  K 4.2 4.1  CL  101 98  CO2 21* 21*  GLUCOSE 107* 96  BUN 23 20  CREATININE 1.07* 0.93  CALCIUM  8.5* 8.1*   Liver Function Tests: Recent Labs  Lab 07/27/23 2055  AST 34  ALT 15  ALKPHOS 64  BILITOT 0.6  PROT 7.1  ALBUMIN 3.6   No results for input(s): LIPASE, AMYLASE in the last 168 hours. No results for input(s): AMMONIA in the last 168 hours. CBC: Recent Labs  Lab 07/27/23 2055 07/28/23 0347  WBC 11.5* 10.8*  NEUTROABS 8.3*  --   HGB 8.6* 8.2*  HCT 29.1* 28.0*  MCV 79.3* 79.8*  PLT 317 292  Cardiac Enzymes: Recent Labs  Lab 07/27/23 2055 07/28/23 0347 07/29/23 0902  CKTOTAL 447* 632* 282*      Component Value Date/Time   COLORURINE YELLOW 07/27/2023 2145   APPEARANCEUR CLEAR 07/27/2023 2145   APPEARANCEUR Clear 02/24/2018 1505   LABSPEC 1.028 07/27/2023 2145   PHURINE 5.0 07/27/2023 2145   GLUCOSEU NEGATIVE 07/27/2023 2145   HGBUR NEGATIVE 07/27/2023 2145   BILIRUBINUR NEGATIVE 07/27/2023 2145   BILIRUBINUR Negative 02/24/2018 1505   KETONESUR NEGATIVE 07/27/2023 2145   PROTEINUR NEGATIVE 07/27/2023 2145   NITRITE NEGATIVE 07/27/2023 2145   LEUKOCYTESUR NEGATIVE 07/27/2023 2145   Sepsis Labs Recent Labs  Lab 07/27/23 2055 07/28/23 0347  WBC 11.5* 10.8*   Microbiology Recent Results (from the past 240 hours)  Resp panel by RT-PCR (RSV, Flu A&B, Covid) Anterior Nasal Swab     Status: None   Collection Time: 07/27/23  8:55 PM   Specimen: Anterior Nasal Swab  Result Value Ref Range Status   SARS Coronavirus 2 by RT PCR NEGATIVE NEGATIVE Final    Comment: (NOTE) SARS-CoV-2 target nucleic acids are NOT DETECTED.  The SARS-CoV-2 RNA is generally detectable in upper respiratory specimens during the acute phase of infection. The lowest concentration of SARS-CoV-2 viral copies this assay can detect is 138 copies/mL. A negative result does not preclude SARS-Cov-2 infection and should not be used as the sole basis  for treatment or other patient management  decisions. A negative result may occur with  improper specimen collection/handling, submission of specimen other than nasopharyngeal swab, presence of viral mutation(s) within the areas targeted by this assay, and inadequate number of viral copies(<138 copies/mL). A negative result must be combined with clinical observations, patient history, and epidemiological information. The expected result is Negative.  Fact Sheet for Patients:  bloggercourse.com  Fact Sheet for Healthcare Providers:  seriousbroker.it  This test is no t yet approved or cleared by the [place]  FDA and  has been authorized for detection and/or diagnosis of SARS-CoV-2 by FDA under an Emergency Use Authorization (EUA). This EUA will remain  in effect (meaning this test can be used) for the duration of the COVID-19 declaration under Section 564(b)(1) of the Act, 21 U.S.C.section 360bbb-3(b)(1), unless the authorization is terminated  or revoked sooner.       Influenza A by PCR NEGATIVE NEGATIVE Final   Influenza B by PCR NEGATIVE NEGATIVE Final    Comment: (NOTE) The Xpert Xpress SARS-CoV-2/FLU/RSV plus assay is intended as an aid in the diagnosis of influenza from Nasopharyngeal swab specimens and should not be used as a sole basis for treatment. Nasal washings and aspirates are unacceptable for Xpert Xpress SARS-CoV-2/FLU/RSV testing.  Fact Sheet for Patients: bloggercourse.com  Fact Sheet for Healthcare Providers: seriousbroker.it  This test is not yet approved or cleared by the [place]  FDA and has been authorized for detection and/or diagnosis of SARS-CoV-2 by FDA under an Emergency Use Authorization (EUA). This EUA will remain in effect (meaning this test can be used) for the duration of the COVID-19 declaration under Section 564(b)(1) of the Act, 21 U.S.C. section 360bbb-3(b)(1), unless the  authorization is terminated or revoked.     Resp Syncytial Virus by PCR NEGATIVE NEGATIVE Final    Comment: (NOTE) Fact Sheet for Patients: bloggercourse.com  Fact Sheet for Healthcare Providers: seriousbroker.it  This test is not yet approved or cleared by the [place]  FDA and has been authorized for detection and/or diagnosis of SARS-CoV-2 by FDA under an Emergency Use Authorization (EUA). This EUA will remain in effect (meaning this test can be used) for the duration of the COVID-19 declaration under Section 564(b)(1) of the Act, 21 U.S.C. section 360bbb-3(b)(1), unless the authorization is terminated or revoked.  Performed at [facility], [address] [place], [place] [address]   Blood culture (routine x 2)     Status: None (Preliminary result)   Collection Time: 07/27/23 10:00 PM   Specimen: BLOOD  Result Value Ref Range Status   Specimen Description   Final    BLOOD RIGHT ANTECUBITAL Performed at [facility], [address] [place], [place] [address]    Special Requests   Final    BOTTLES DRAWN AEROBIC AND ANAEROBIC Blood Culture results may not be optimal due to an inadequate volume of blood received in culture bottles Performed at [facility], [address] [place], [place] [address]    Culture   Final    NO GROWTH 3 DAYS Performed at [facility], [address], [place], [place] [address]    Report Status PENDING  Incomplete  Blood culture (routine x 2)     Status: None (Preliminary result)   Collection Time: 07/27/23 10:14 PM   Specimen: BLOOD RIGHT FOREARM  Result Value Ref Range Status   Specimen Description   Final    BLOOD RIGHT FOREARM Performed at [facility], [address], [place], [place] [address]    Special Requests   Final    BOTTLES DRAWN AEROBIC AND ANAEROBIC Blood Culture adequate volume Performed  at [facility], [address] [place], [place] [address]    Culture   Final    NO GROWTH 3 DAYS Performed at [facility], [address], [place], [place] [address]    Report Status PENDING  Incomplete     Time coordinating discharge: 25 minutes  SIGNED: Mennie LAMY, MD  [facility] 07/31/2023, 2:04 PM  If 7PM-7AM, please contact night-coverage [email or web address]

## 2023-08-02 DIAGNOSIS — L0390 Cellulitis, unspecified: Secondary | ICD-10-CM | POA: Diagnosis not present

## 2023-08-02 DIAGNOSIS — R531 Weakness: Secondary | ICD-10-CM | POA: Diagnosis not present

## 2023-08-02 DIAGNOSIS — E039 Hypothyroidism, unspecified: Secondary | ICD-10-CM | POA: Diagnosis not present

## 2023-08-02 DIAGNOSIS — D649 Anemia, unspecified: Secondary | ICD-10-CM | POA: Diagnosis not present

## 2023-08-02 DIAGNOSIS — G309 Alzheimer's disease, unspecified: Secondary | ICD-10-CM | POA: Diagnosis not present

## 2023-08-02 LAB — CULTURE, BLOOD (ROUTINE X 2)
Culture: NO GROWTH
Culture: NO GROWTH
Special Requests: ADEQUATE

## 2023-08-03 DIAGNOSIS — F0280 Dementia in other diseases classified elsewhere without behavioral disturbance: Secondary | ICD-10-CM | POA: Diagnosis not present

## 2023-08-03 DIAGNOSIS — G309 Alzheimer's disease, unspecified: Secondary | ICD-10-CM | POA: Diagnosis not present

## 2023-08-03 DIAGNOSIS — L0390 Cellulitis, unspecified: Secondary | ICD-10-CM | POA: Diagnosis not present

## 2023-08-03 DIAGNOSIS — D649 Anemia, unspecified: Secondary | ICD-10-CM | POA: Diagnosis not present

## 2023-08-03 DIAGNOSIS — R531 Weakness: Secondary | ICD-10-CM | POA: Diagnosis not present

## 2023-08-03 DIAGNOSIS — I10 Essential (primary) hypertension: Secondary | ICD-10-CM | POA: Diagnosis not present

## 2023-08-04 DIAGNOSIS — L299 Pruritus, unspecified: Secondary | ICD-10-CM | POA: Diagnosis not present

## 2023-08-04 DIAGNOSIS — G309 Alzheimer's disease, unspecified: Secondary | ICD-10-CM | POA: Diagnosis not present

## 2023-08-04 DIAGNOSIS — R531 Weakness: Secondary | ICD-10-CM | POA: Diagnosis not present

## 2023-08-05 DIAGNOSIS — L299 Pruritus, unspecified: Secondary | ICD-10-CM | POA: Diagnosis not present

## 2023-08-05 DIAGNOSIS — R531 Weakness: Secondary | ICD-10-CM | POA: Diagnosis not present

## 2023-08-05 DIAGNOSIS — G309 Alzheimer's disease, unspecified: Secondary | ICD-10-CM | POA: Diagnosis not present

## 2023-08-05 DIAGNOSIS — S80819A Abrasion, unspecified lower leg, initial encounter: Secondary | ICD-10-CM | POA: Diagnosis not present

## 2023-08-09 DIAGNOSIS — R059 Cough, unspecified: Secondary | ICD-10-CM | POA: Diagnosis not present

## 2023-08-09 DIAGNOSIS — N182 Chronic kidney disease, stage 2 (mild): Secondary | ICD-10-CM | POA: Diagnosis not present

## 2023-08-09 DIAGNOSIS — R600 Localized edema: Secondary | ICD-10-CM | POA: Diagnosis not present

## 2023-08-09 DIAGNOSIS — D649 Anemia, unspecified: Secondary | ICD-10-CM | POA: Diagnosis not present

## 2023-08-09 DIAGNOSIS — G309 Alzheimer's disease, unspecified: Secondary | ICD-10-CM | POA: Diagnosis not present

## 2023-08-09 DIAGNOSIS — R531 Weakness: Secondary | ICD-10-CM | POA: Diagnosis not present

## 2023-08-11 DIAGNOSIS — R600 Localized edema: Secondary | ICD-10-CM | POA: Diagnosis not present

## 2023-08-11 DIAGNOSIS — R059 Cough, unspecified: Secondary | ICD-10-CM | POA: Diagnosis not present

## 2023-08-11 DIAGNOSIS — R062 Wheezing: Secondary | ICD-10-CM | POA: Diagnosis not present

## 2023-08-11 DIAGNOSIS — G309 Alzheimer's disease, unspecified: Secondary | ICD-10-CM | POA: Diagnosis not present

## 2023-08-11 DIAGNOSIS — R531 Weakness: Secondary | ICD-10-CM | POA: Diagnosis not present

## 2023-08-17 DIAGNOSIS — R531 Weakness: Secondary | ICD-10-CM | POA: Diagnosis not present

## 2023-08-17 DIAGNOSIS — B372 Candidiasis of skin and nail: Secondary | ICD-10-CM | POA: Diagnosis not present

## 2023-08-17 DIAGNOSIS — R600 Localized edema: Secondary | ICD-10-CM | POA: Diagnosis not present

## 2023-08-17 DIAGNOSIS — G309 Alzheimer's disease, unspecified: Secondary | ICD-10-CM | POA: Diagnosis not present

## 2023-08-17 DIAGNOSIS — R062 Wheezing: Secondary | ICD-10-CM | POA: Diagnosis not present

## 2023-08-17 DIAGNOSIS — L299 Pruritus, unspecified: Secondary | ICD-10-CM | POA: Diagnosis not present

## 2023-08-23 DIAGNOSIS — R062 Wheezing: Secondary | ICD-10-CM | POA: Diagnosis not present

## 2023-08-23 DIAGNOSIS — R531 Weakness: Secondary | ICD-10-CM | POA: Diagnosis not present

## 2023-08-23 DIAGNOSIS — G309 Alzheimer's disease, unspecified: Secondary | ICD-10-CM | POA: Diagnosis not present

## 2023-08-23 DIAGNOSIS — R059 Cough, unspecified: Secondary | ICD-10-CM | POA: Diagnosis not present

## 2023-08-24 DIAGNOSIS — R531 Weakness: Secondary | ICD-10-CM | POA: Diagnosis not present

## 2023-08-24 DIAGNOSIS — G309 Alzheimer's disease, unspecified: Secondary | ICD-10-CM | POA: Diagnosis not present

## 2023-08-24 DIAGNOSIS — R059 Cough, unspecified: Secondary | ICD-10-CM | POA: Diagnosis not present

## 2023-08-24 DIAGNOSIS — R062 Wheezing: Secondary | ICD-10-CM | POA: Diagnosis not present

## 2023-08-24 DIAGNOSIS — J9811 Atelectasis: Secondary | ICD-10-CM | POA: Diagnosis not present

## 2023-08-25 DIAGNOSIS — R531 Weakness: Secondary | ICD-10-CM | POA: Diagnosis not present

## 2023-08-25 DIAGNOSIS — R062 Wheezing: Secondary | ICD-10-CM | POA: Diagnosis not present

## 2023-08-25 DIAGNOSIS — R059 Cough, unspecified: Secondary | ICD-10-CM | POA: Diagnosis not present

## 2023-08-25 DIAGNOSIS — J9811 Atelectasis: Secondary | ICD-10-CM | POA: Diagnosis not present

## 2023-08-25 DIAGNOSIS — G309 Alzheimer's disease, unspecified: Secondary | ICD-10-CM | POA: Diagnosis not present

## 2023-08-25 DIAGNOSIS — D649 Anemia, unspecified: Secondary | ICD-10-CM | POA: Diagnosis not present

## 2023-08-26 DIAGNOSIS — R059 Cough, unspecified: Secondary | ICD-10-CM | POA: Diagnosis not present

## 2023-08-26 DIAGNOSIS — R531 Weakness: Secondary | ICD-10-CM | POA: Diagnosis not present

## 2023-08-26 DIAGNOSIS — G309 Alzheimer's disease, unspecified: Secondary | ICD-10-CM | POA: Diagnosis not present

## 2023-08-26 DIAGNOSIS — N1831 Chronic kidney disease, stage 3a: Secondary | ICD-10-CM | POA: Diagnosis not present

## 2023-08-26 DIAGNOSIS — D649 Anemia, unspecified: Secondary | ICD-10-CM | POA: Diagnosis not present

## 2023-08-27 DIAGNOSIS — R059 Cough, unspecified: Secondary | ICD-10-CM | POA: Diagnosis not present

## 2023-08-27 DIAGNOSIS — G309 Alzheimer's disease, unspecified: Secondary | ICD-10-CM | POA: Diagnosis not present

## 2023-08-27 DIAGNOSIS — J101 Influenza due to other identified influenza virus with other respiratory manifestations: Secondary | ICD-10-CM | POA: Diagnosis not present

## 2023-08-30 DIAGNOSIS — R531 Weakness: Secondary | ICD-10-CM | POA: Diagnosis not present

## 2023-08-30 DIAGNOSIS — J101 Influenza due to other identified influenza virus with other respiratory manifestations: Secondary | ICD-10-CM | POA: Diagnosis not present

## 2023-08-30 DIAGNOSIS — G309 Alzheimer's disease, unspecified: Secondary | ICD-10-CM | POA: Diagnosis not present

## 2023-08-31 DIAGNOSIS — R059 Cough, unspecified: Secondary | ICD-10-CM | POA: Diagnosis not present

## 2023-08-31 DIAGNOSIS — J101 Influenza due to other identified influenza virus with other respiratory manifestations: Secondary | ICD-10-CM | POA: Diagnosis not present

## 2023-08-31 DIAGNOSIS — G309 Alzheimer's disease, unspecified: Secondary | ICD-10-CM | POA: Diagnosis not present

## 2023-08-31 DIAGNOSIS — R531 Weakness: Secondary | ICD-10-CM | POA: Diagnosis not present

## 2023-09-01 DIAGNOSIS — J101 Influenza due to other identified influenza virus with other respiratory manifestations: Secondary | ICD-10-CM | POA: Diagnosis not present

## 2023-09-01 DIAGNOSIS — R451 Restlessness and agitation: Secondary | ICD-10-CM | POA: Diagnosis not present

## 2023-09-01 DIAGNOSIS — G309 Alzheimer's disease, unspecified: Secondary | ICD-10-CM | POA: Diagnosis not present

## 2023-09-01 DIAGNOSIS — R531 Weakness: Secondary | ICD-10-CM | POA: Diagnosis not present

## 2023-09-02 DIAGNOSIS — R531 Weakness: Secondary | ICD-10-CM | POA: Diagnosis not present

## 2023-09-02 DIAGNOSIS — G309 Alzheimer's disease, unspecified: Secondary | ICD-10-CM | POA: Diagnosis not present

## 2023-09-02 DIAGNOSIS — J101 Influenza due to other identified influenza virus with other respiratory manifestations: Secondary | ICD-10-CM | POA: Diagnosis not present

## 2023-09-03 DIAGNOSIS — J101 Influenza due to other identified influenza virus with other respiratory manifestations: Secondary | ICD-10-CM | POA: Diagnosis not present

## 2023-09-03 DIAGNOSIS — R531 Weakness: Secondary | ICD-10-CM | POA: Diagnosis not present

## 2023-09-03 DIAGNOSIS — G309 Alzheimer's disease, unspecified: Secondary | ICD-10-CM | POA: Diagnosis not present

## 2023-09-07 DIAGNOSIS — G309 Alzheimer's disease, unspecified: Secondary | ICD-10-CM | POA: Diagnosis not present

## 2023-09-07 DIAGNOSIS — R531 Weakness: Secondary | ICD-10-CM | POA: Diagnosis not present

## 2023-09-07 DIAGNOSIS — J101 Influenza due to other identified influenza virus with other respiratory manifestations: Secondary | ICD-10-CM | POA: Diagnosis not present

## 2023-09-07 DIAGNOSIS — E860 Dehydration: Secondary | ICD-10-CM | POA: Diagnosis not present

## 2023-09-07 DIAGNOSIS — R638 Other symptoms and signs concerning food and fluid intake: Secondary | ICD-10-CM | POA: Diagnosis not present

## 2023-09-10 DIAGNOSIS — R531 Weakness: Secondary | ICD-10-CM | POA: Diagnosis not present

## 2023-09-10 DIAGNOSIS — G309 Alzheimer's disease, unspecified: Secondary | ICD-10-CM | POA: Diagnosis not present

## 2023-09-10 DIAGNOSIS — Z7409 Other reduced mobility: Secondary | ICD-10-CM | POA: Diagnosis not present

## 2023-09-10 DIAGNOSIS — F02A0 Dementia in other diseases classified elsewhere, mild, without behavioral disturbance, psychotic disturbance, mood disturbance, and anxiety: Secondary | ICD-10-CM | POA: Diagnosis not present

## 2023-09-10 DIAGNOSIS — J9600 Acute respiratory failure, unspecified whether with hypoxia or hypercapnia: Secondary | ICD-10-CM | POA: Diagnosis not present

## 2023-09-10 DIAGNOSIS — Z9981 Dependence on supplemental oxygen: Secondary | ICD-10-CM | POA: Diagnosis not present

## 2023-09-22 DIAGNOSIS — G309 Alzheimer's disease, unspecified: Secondary | ICD-10-CM | POA: Diagnosis not present

## 2023-09-22 DIAGNOSIS — J9600 Acute respiratory failure, unspecified whether with hypoxia or hypercapnia: Secondary | ICD-10-CM | POA: Diagnosis not present

## 2023-09-22 DIAGNOSIS — R531 Weakness: Secondary | ICD-10-CM | POA: Diagnosis not present

## 2023-09-29 DIAGNOSIS — R638 Other symptoms and signs concerning food and fluid intake: Secondary | ICD-10-CM | POA: Diagnosis not present

## 2023-09-29 DIAGNOSIS — G309 Alzheimer's disease, unspecified: Secondary | ICD-10-CM | POA: Diagnosis not present

## 2023-09-29 DIAGNOSIS — R531 Weakness: Secondary | ICD-10-CM | POA: Diagnosis not present

## 2023-09-29 DIAGNOSIS — L03317 Cellulitis of buttock: Secondary | ICD-10-CM | POA: Diagnosis not present

## 2023-09-29 DIAGNOSIS — J9600 Acute respiratory failure, unspecified whether with hypoxia or hypercapnia: Secondary | ICD-10-CM | POA: Diagnosis not present

## 2023-09-29 DIAGNOSIS — M6282 Rhabdomyolysis: Secondary | ICD-10-CM | POA: Diagnosis not present

## 2023-10-01 DIAGNOSIS — F02A0 Dementia in other diseases classified elsewhere, mild, without behavioral disturbance, psychotic disturbance, mood disturbance, and anxiety: Secondary | ICD-10-CM | POA: Diagnosis not present

## 2023-10-01 DIAGNOSIS — R531 Weakness: Secondary | ICD-10-CM | POA: Diagnosis not present

## 2023-10-01 DIAGNOSIS — J9600 Acute respiratory failure, unspecified whether with hypoxia or hypercapnia: Secondary | ICD-10-CM | POA: Diagnosis not present

## 2023-10-06 DIAGNOSIS — Z7409 Other reduced mobility: Secondary | ICD-10-CM | POA: Diagnosis not present

## 2023-10-06 DIAGNOSIS — R600 Localized edema: Secondary | ICD-10-CM | POA: Diagnosis not present

## 2023-10-06 DIAGNOSIS — Z79899 Other long term (current) drug therapy: Secondary | ICD-10-CM | POA: Diagnosis not present

## 2023-10-06 DIAGNOSIS — F02A0 Dementia in other diseases classified elsewhere, mild, without behavioral disturbance, psychotic disturbance, mood disturbance, and anxiety: Secondary | ICD-10-CM | POA: Diagnosis not present

## 2023-10-06 DIAGNOSIS — R531 Weakness: Secondary | ICD-10-CM | POA: Diagnosis not present

## 2023-10-08 DIAGNOSIS — F02A0 Dementia in other diseases classified elsewhere, mild, without behavioral disturbance, psychotic disturbance, mood disturbance, and anxiety: Secondary | ICD-10-CM | POA: Diagnosis not present

## 2023-10-08 DIAGNOSIS — N182 Chronic kidney disease, stage 2 (mild): Secondary | ICD-10-CM | POA: Diagnosis not present

## 2023-10-08 DIAGNOSIS — R600 Localized edema: Secondary | ICD-10-CM | POA: Diagnosis not present

## 2023-10-08 DIAGNOSIS — D649 Anemia, unspecified: Secondary | ICD-10-CM | POA: Diagnosis not present

## 2023-10-08 DIAGNOSIS — R531 Weakness: Secondary | ICD-10-CM | POA: Diagnosis not present

## 2023-10-12 DIAGNOSIS — R21 Rash and other nonspecific skin eruption: Secondary | ICD-10-CM | POA: Diagnosis not present

## 2023-10-12 DIAGNOSIS — F02A0 Dementia in other diseases classified elsewhere, mild, without behavioral disturbance, psychotic disturbance, mood disturbance, and anxiety: Secondary | ICD-10-CM | POA: Diagnosis not present

## 2023-10-12 DIAGNOSIS — G309 Alzheimer's disease, unspecified: Secondary | ICD-10-CM | POA: Diagnosis not present

## 2023-10-12 DIAGNOSIS — M6281 Muscle weakness (generalized): Secondary | ICD-10-CM | POA: Diagnosis not present

## 2023-10-13 DIAGNOSIS — M6282 Rhabdomyolysis: Secondary | ICD-10-CM | POA: Diagnosis not present

## 2023-10-13 DIAGNOSIS — R21 Rash and other nonspecific skin eruption: Secondary | ICD-10-CM | POA: Diagnosis not present

## 2023-10-13 DIAGNOSIS — M6281 Muscle weakness (generalized): Secondary | ICD-10-CM | POA: Diagnosis not present

## 2023-10-13 DIAGNOSIS — G309 Alzheimer's disease, unspecified: Secondary | ICD-10-CM | POA: Diagnosis not present

## 2023-10-13 DIAGNOSIS — F02A0 Dementia in other diseases classified elsewhere, mild, without behavioral disturbance, psychotic disturbance, mood disturbance, and anxiety: Secondary | ICD-10-CM | POA: Diagnosis not present

## 2023-10-13 DIAGNOSIS — L03317 Cellulitis of buttock: Secondary | ICD-10-CM | POA: Diagnosis not present

## 2023-10-20 DIAGNOSIS — L03317 Cellulitis of buttock: Secondary | ICD-10-CM | POA: Diagnosis not present

## 2023-10-20 DIAGNOSIS — M6282 Rhabdomyolysis: Secondary | ICD-10-CM | POA: Diagnosis not present

## 2023-10-23 DIAGNOSIS — Z79899 Other long term (current) drug therapy: Secondary | ICD-10-CM | POA: Diagnosis not present

## 2023-10-23 DIAGNOSIS — G309 Alzheimer's disease, unspecified: Secondary | ICD-10-CM | POA: Diagnosis not present

## 2023-10-23 DIAGNOSIS — M6281 Muscle weakness (generalized): Secondary | ICD-10-CM | POA: Diagnosis not present

## 2023-10-23 DIAGNOSIS — R21 Rash and other nonspecific skin eruption: Secondary | ICD-10-CM | POA: Diagnosis not present

## 2023-10-26 DIAGNOSIS — F02A0 Dementia in other diseases classified elsewhere, mild, without behavioral disturbance, psychotic disturbance, mood disturbance, and anxiety: Secondary | ICD-10-CM | POA: Diagnosis not present

## 2023-10-26 DIAGNOSIS — Z7409 Other reduced mobility: Secondary | ICD-10-CM | POA: Diagnosis not present

## 2023-10-26 DIAGNOSIS — M6281 Muscle weakness (generalized): Secondary | ICD-10-CM | POA: Diagnosis not present

## 2023-10-26 DIAGNOSIS — G309 Alzheimer's disease, unspecified: Secondary | ICD-10-CM | POA: Diagnosis not present

## 2023-10-28 DIAGNOSIS — Z8673 Personal history of transient ischemic attack (TIA), and cerebral infarction without residual deficits: Secondary | ICD-10-CM | POA: Diagnosis not present

## 2023-10-28 DIAGNOSIS — F0280 Dementia in other diseases classified elsewhere without behavioral disturbance: Secondary | ICD-10-CM | POA: Diagnosis not present

## 2023-10-28 DIAGNOSIS — R531 Weakness: Secondary | ICD-10-CM | POA: Diagnosis not present

## 2023-10-28 DIAGNOSIS — N1831 Chronic kidney disease, stage 3a: Secondary | ICD-10-CM | POA: Diagnosis not present

## 2023-10-28 DIAGNOSIS — Z860100 Personal history of colon polyps, unspecified: Secondary | ICD-10-CM | POA: Diagnosis not present

## 2023-10-28 DIAGNOSIS — Z6839 Body mass index (BMI) 39.0-39.9, adult: Secondary | ICD-10-CM | POA: Diagnosis not present

## 2023-10-28 DIAGNOSIS — E785 Hyperlipidemia, unspecified: Secondary | ICD-10-CM | POA: Diagnosis not present

## 2023-10-28 DIAGNOSIS — I129 Hypertensive chronic kidney disease with stage 1 through stage 4 chronic kidney disease, or unspecified chronic kidney disease: Secondary | ICD-10-CM | POA: Diagnosis not present

## 2023-10-28 DIAGNOSIS — M8580 Other specified disorders of bone density and structure, unspecified site: Secondary | ICD-10-CM | POA: Diagnosis not present

## 2023-10-28 DIAGNOSIS — E039 Hypothyroidism, unspecified: Secondary | ICD-10-CM | POA: Diagnosis not present

## 2023-10-28 DIAGNOSIS — M150 Primary generalized (osteo)arthritis: Secondary | ICD-10-CM | POA: Diagnosis not present

## 2023-10-28 DIAGNOSIS — E538 Deficiency of other specified B group vitamins: Secondary | ICD-10-CM | POA: Diagnosis not present

## 2023-10-28 DIAGNOSIS — M5416 Radiculopathy, lumbar region: Secondary | ICD-10-CM | POA: Diagnosis not present

## 2023-10-28 DIAGNOSIS — D631 Anemia in chronic kidney disease: Secondary | ICD-10-CM | POA: Diagnosis not present

## 2023-10-28 DIAGNOSIS — K219 Gastro-esophageal reflux disease without esophagitis: Secondary | ICD-10-CM | POA: Diagnosis not present

## 2023-10-28 DIAGNOSIS — G309 Alzheimer's disease, unspecified: Secondary | ICD-10-CM | POA: Diagnosis not present

## 2023-11-01 DIAGNOSIS — M150 Primary generalized (osteo)arthritis: Secondary | ICD-10-CM | POA: Diagnosis not present

## 2023-11-01 DIAGNOSIS — I129 Hypertensive chronic kidney disease with stage 1 through stage 4 chronic kidney disease, or unspecified chronic kidney disease: Secondary | ICD-10-CM | POA: Diagnosis not present

## 2023-11-01 DIAGNOSIS — N1831 Chronic kidney disease, stage 3a: Secondary | ICD-10-CM | POA: Diagnosis not present

## 2023-11-01 DIAGNOSIS — G309 Alzheimer's disease, unspecified: Secondary | ICD-10-CM | POA: Diagnosis not present

## 2023-11-01 DIAGNOSIS — R531 Weakness: Secondary | ICD-10-CM | POA: Diagnosis not present

## 2023-11-01 DIAGNOSIS — F0280 Dementia in other diseases classified elsewhere without behavioral disturbance: Secondary | ICD-10-CM | POA: Diagnosis not present

## 2023-11-05 DIAGNOSIS — R531 Weakness: Secondary | ICD-10-CM | POA: Diagnosis not present

## 2023-11-05 DIAGNOSIS — F0280 Dementia in other diseases classified elsewhere without behavioral disturbance: Secondary | ICD-10-CM | POA: Diagnosis not present

## 2023-11-05 DIAGNOSIS — N1831 Chronic kidney disease, stage 3a: Secondary | ICD-10-CM | POA: Diagnosis not present

## 2023-11-05 DIAGNOSIS — I129 Hypertensive chronic kidney disease with stage 1 through stage 4 chronic kidney disease, or unspecified chronic kidney disease: Secondary | ICD-10-CM | POA: Diagnosis not present

## 2023-11-05 DIAGNOSIS — G309 Alzheimer's disease, unspecified: Secondary | ICD-10-CM | POA: Diagnosis not present

## 2023-11-05 DIAGNOSIS — M150 Primary generalized (osteo)arthritis: Secondary | ICD-10-CM | POA: Diagnosis not present

## 2023-11-11 DIAGNOSIS — R531 Weakness: Secondary | ICD-10-CM | POA: Diagnosis not present

## 2023-11-11 DIAGNOSIS — M150 Primary generalized (osteo)arthritis: Secondary | ICD-10-CM | POA: Diagnosis not present

## 2023-11-11 DIAGNOSIS — N1831 Chronic kidney disease, stage 3a: Secondary | ICD-10-CM | POA: Diagnosis not present

## 2023-11-11 DIAGNOSIS — F0280 Dementia in other diseases classified elsewhere without behavioral disturbance: Secondary | ICD-10-CM | POA: Diagnosis not present

## 2023-11-11 DIAGNOSIS — G309 Alzheimer's disease, unspecified: Secondary | ICD-10-CM | POA: Diagnosis not present

## 2023-11-11 DIAGNOSIS — I129 Hypertensive chronic kidney disease with stage 1 through stage 4 chronic kidney disease, or unspecified chronic kidney disease: Secondary | ICD-10-CM | POA: Diagnosis not present

## 2023-11-17 DIAGNOSIS — R531 Weakness: Secondary | ICD-10-CM | POA: Diagnosis not present

## 2023-11-17 DIAGNOSIS — F0280 Dementia in other diseases classified elsewhere without behavioral disturbance: Secondary | ICD-10-CM | POA: Diagnosis not present

## 2023-11-17 DIAGNOSIS — M150 Primary generalized (osteo)arthritis: Secondary | ICD-10-CM | POA: Diagnosis not present

## 2023-11-17 DIAGNOSIS — I129 Hypertensive chronic kidney disease with stage 1 through stage 4 chronic kidney disease, or unspecified chronic kidney disease: Secondary | ICD-10-CM | POA: Diagnosis not present

## 2023-11-17 DIAGNOSIS — G309 Alzheimer's disease, unspecified: Secondary | ICD-10-CM | POA: Diagnosis not present

## 2023-11-17 DIAGNOSIS — N1831 Chronic kidney disease, stage 3a: Secondary | ICD-10-CM | POA: Diagnosis not present

## 2023-11-23 DIAGNOSIS — R531 Weakness: Secondary | ICD-10-CM | POA: Diagnosis not present

## 2023-11-23 DIAGNOSIS — F0280 Dementia in other diseases classified elsewhere without behavioral disturbance: Secondary | ICD-10-CM | POA: Diagnosis not present

## 2023-11-23 DIAGNOSIS — M150 Primary generalized (osteo)arthritis: Secondary | ICD-10-CM | POA: Diagnosis not present

## 2023-11-23 DIAGNOSIS — G309 Alzheimer's disease, unspecified: Secondary | ICD-10-CM | POA: Diagnosis not present

## 2023-11-23 DIAGNOSIS — I129 Hypertensive chronic kidney disease with stage 1 through stage 4 chronic kidney disease, or unspecified chronic kidney disease: Secondary | ICD-10-CM | POA: Diagnosis not present

## 2023-11-23 DIAGNOSIS — N1831 Chronic kidney disease, stage 3a: Secondary | ICD-10-CM | POA: Diagnosis not present

## 2023-11-24 DIAGNOSIS — E785 Hyperlipidemia, unspecified: Secondary | ICD-10-CM | POA: Diagnosis not present

## 2023-11-24 DIAGNOSIS — L853 Xerosis cutis: Secondary | ICD-10-CM | POA: Diagnosis not present

## 2023-11-24 DIAGNOSIS — G3183 Dementia with Lewy bodies: Secondary | ICD-10-CM | POA: Diagnosis not present

## 2023-11-24 DIAGNOSIS — F0280 Dementia in other diseases classified elsewhere without behavioral disturbance: Secondary | ICD-10-CM | POA: Diagnosis not present

## 2023-11-24 DIAGNOSIS — D509 Iron deficiency anemia, unspecified: Secondary | ICD-10-CM | POA: Diagnosis not present

## 2023-11-24 DIAGNOSIS — E039 Hypothyroidism, unspecified: Secondary | ICD-10-CM | POA: Diagnosis not present

## 2023-11-24 DIAGNOSIS — I10 Essential (primary) hypertension: Secondary | ICD-10-CM | POA: Diagnosis not present

## 2023-11-24 DIAGNOSIS — Z66 Do not resuscitate: Secondary | ICD-10-CM | POA: Diagnosis not present

## 2023-11-27 DIAGNOSIS — E785 Hyperlipidemia, unspecified: Secondary | ICD-10-CM | POA: Diagnosis not present

## 2023-11-27 DIAGNOSIS — Z860100 Personal history of colon polyps, unspecified: Secondary | ICD-10-CM | POA: Diagnosis not present

## 2023-11-27 DIAGNOSIS — M150 Primary generalized (osteo)arthritis: Secondary | ICD-10-CM | POA: Diagnosis not present

## 2023-11-27 DIAGNOSIS — M8580 Other specified disorders of bone density and structure, unspecified site: Secondary | ICD-10-CM | POA: Diagnosis not present

## 2023-11-27 DIAGNOSIS — F0280 Dementia in other diseases classified elsewhere without behavioral disturbance: Secondary | ICD-10-CM | POA: Diagnosis not present

## 2023-11-27 DIAGNOSIS — G309 Alzheimer's disease, unspecified: Secondary | ICD-10-CM | POA: Diagnosis not present

## 2023-11-27 DIAGNOSIS — Z6839 Body mass index (BMI) 39.0-39.9, adult: Secondary | ICD-10-CM | POA: Diagnosis not present

## 2023-11-27 DIAGNOSIS — E538 Deficiency of other specified B group vitamins: Secondary | ICD-10-CM | POA: Diagnosis not present

## 2023-11-27 DIAGNOSIS — Z8673 Personal history of transient ischemic attack (TIA), and cerebral infarction without residual deficits: Secondary | ICD-10-CM | POA: Diagnosis not present

## 2023-11-27 DIAGNOSIS — N1831 Chronic kidney disease, stage 3a: Secondary | ICD-10-CM | POA: Diagnosis not present

## 2023-11-27 DIAGNOSIS — E039 Hypothyroidism, unspecified: Secondary | ICD-10-CM | POA: Diagnosis not present

## 2023-11-27 DIAGNOSIS — I129 Hypertensive chronic kidney disease with stage 1 through stage 4 chronic kidney disease, or unspecified chronic kidney disease: Secondary | ICD-10-CM | POA: Diagnosis not present

## 2023-11-27 DIAGNOSIS — K219 Gastro-esophageal reflux disease without esophagitis: Secondary | ICD-10-CM | POA: Diagnosis not present

## 2023-11-27 DIAGNOSIS — D631 Anemia in chronic kidney disease: Secondary | ICD-10-CM | POA: Diagnosis not present

## 2023-11-27 DIAGNOSIS — R531 Weakness: Secondary | ICD-10-CM | POA: Diagnosis not present

## 2023-11-27 DIAGNOSIS — M5416 Radiculopathy, lumbar region: Secondary | ICD-10-CM | POA: Diagnosis not present

## 2023-12-01 DIAGNOSIS — G309 Alzheimer's disease, unspecified: Secondary | ICD-10-CM | POA: Diagnosis not present

## 2023-12-01 DIAGNOSIS — F0280 Dementia in other diseases classified elsewhere without behavioral disturbance: Secondary | ICD-10-CM | POA: Diagnosis not present

## 2023-12-01 DIAGNOSIS — R531 Weakness: Secondary | ICD-10-CM | POA: Diagnosis not present

## 2023-12-01 DIAGNOSIS — N1831 Chronic kidney disease, stage 3a: Secondary | ICD-10-CM | POA: Diagnosis not present

## 2023-12-01 DIAGNOSIS — I129 Hypertensive chronic kidney disease with stage 1 through stage 4 chronic kidney disease, or unspecified chronic kidney disease: Secondary | ICD-10-CM | POA: Diagnosis not present

## 2023-12-01 DIAGNOSIS — M150 Primary generalized (osteo)arthritis: Secondary | ICD-10-CM | POA: Diagnosis not present

## 2023-12-08 DIAGNOSIS — F0280 Dementia in other diseases classified elsewhere without behavioral disturbance: Secondary | ICD-10-CM | POA: Diagnosis not present

## 2023-12-08 DIAGNOSIS — N1831 Chronic kidney disease, stage 3a: Secondary | ICD-10-CM | POA: Diagnosis not present

## 2023-12-08 DIAGNOSIS — R531 Weakness: Secondary | ICD-10-CM | POA: Diagnosis not present

## 2023-12-08 DIAGNOSIS — M150 Primary generalized (osteo)arthritis: Secondary | ICD-10-CM | POA: Diagnosis not present

## 2023-12-08 DIAGNOSIS — G309 Alzheimer's disease, unspecified: Secondary | ICD-10-CM | POA: Diagnosis not present

## 2023-12-08 DIAGNOSIS — I129 Hypertensive chronic kidney disease with stage 1 through stage 4 chronic kidney disease, or unspecified chronic kidney disease: Secondary | ICD-10-CM | POA: Diagnosis not present

## 2023-12-20 DIAGNOSIS — M150 Primary generalized (osteo)arthritis: Secondary | ICD-10-CM | POA: Diagnosis not present

## 2023-12-20 DIAGNOSIS — I129 Hypertensive chronic kidney disease with stage 1 through stage 4 chronic kidney disease, or unspecified chronic kidney disease: Secondary | ICD-10-CM | POA: Diagnosis not present

## 2023-12-20 DIAGNOSIS — F0280 Dementia in other diseases classified elsewhere without behavioral disturbance: Secondary | ICD-10-CM | POA: Diagnosis not present

## 2023-12-20 DIAGNOSIS — R531 Weakness: Secondary | ICD-10-CM | POA: Diagnosis not present

## 2023-12-20 DIAGNOSIS — N1831 Chronic kidney disease, stage 3a: Secondary | ICD-10-CM | POA: Diagnosis not present

## 2023-12-20 DIAGNOSIS — G309 Alzheimer's disease, unspecified: Secondary | ICD-10-CM | POA: Diagnosis not present

## 2024-01-04 DIAGNOSIS — E039 Hypothyroidism, unspecified: Secondary | ICD-10-CM | POA: Diagnosis not present

## 2024-01-04 DIAGNOSIS — G3183 Dementia with Lewy bodies: Secondary | ICD-10-CM | POA: Diagnosis not present

## 2024-01-04 DIAGNOSIS — M545 Low back pain, unspecified: Secondary | ICD-10-CM | POA: Diagnosis not present

## 2024-01-04 DIAGNOSIS — F02A0 Dementia in other diseases classified elsewhere, mild, without behavioral disturbance, psychotic disturbance, mood disturbance, and anxiety: Secondary | ICD-10-CM | POA: Diagnosis not present

## 2024-01-04 DIAGNOSIS — E782 Mixed hyperlipidemia: Secondary | ICD-10-CM | POA: Diagnosis not present

## 2024-01-04 DIAGNOSIS — I10 Essential (primary) hypertension: Secondary | ICD-10-CM | POA: Diagnosis not present

## 2024-01-13 DIAGNOSIS — E559 Vitamin D deficiency, unspecified: Secondary | ICD-10-CM | POA: Diagnosis not present

## 2024-01-13 DIAGNOSIS — Z79899 Other long term (current) drug therapy: Secondary | ICD-10-CM | POA: Diagnosis not present

## 2024-02-07 DIAGNOSIS — R051 Acute cough: Secondary | ICD-10-CM | POA: Diagnosis not present

## 2024-02-07 DIAGNOSIS — E785 Hyperlipidemia, unspecified: Secondary | ICD-10-CM | POA: Diagnosis not present

## 2024-02-07 DIAGNOSIS — G3183 Dementia with Lewy bodies: Secondary | ICD-10-CM | POA: Diagnosis not present

## 2024-02-07 DIAGNOSIS — E039 Hypothyroidism, unspecified: Secondary | ICD-10-CM | POA: Diagnosis not present

## 2024-02-07 DIAGNOSIS — I10 Essential (primary) hypertension: Secondary | ICD-10-CM | POA: Diagnosis not present

## 2024-02-07 DIAGNOSIS — F0280 Dementia in other diseases classified elsewhere without behavioral disturbance: Secondary | ICD-10-CM | POA: Diagnosis not present

## 2024-02-09 DIAGNOSIS — Z79899 Other long term (current) drug therapy: Secondary | ICD-10-CM | POA: Diagnosis not present

## 2024-02-09 DIAGNOSIS — E559 Vitamin D deficiency, unspecified: Secondary | ICD-10-CM | POA: Diagnosis not present

## 2024-03-06 DIAGNOSIS — F0280 Dementia in other diseases classified elsewhere without behavioral disturbance: Secondary | ICD-10-CM | POA: Diagnosis not present

## 2024-03-06 DIAGNOSIS — E039 Hypothyroidism, unspecified: Secondary | ICD-10-CM | POA: Diagnosis not present

## 2024-03-06 DIAGNOSIS — G3183 Dementia with Lewy bodies: Secondary | ICD-10-CM | POA: Diagnosis not present

## 2024-03-06 DIAGNOSIS — I10 Essential (primary) hypertension: Secondary | ICD-10-CM | POA: Diagnosis not present

## 2024-03-22 DIAGNOSIS — R110 Nausea: Secondary | ICD-10-CM | POA: Diagnosis not present

## 2024-03-22 DIAGNOSIS — E039 Hypothyroidism, unspecified: Secondary | ICD-10-CM | POA: Diagnosis not present

## 2024-03-22 DIAGNOSIS — R519 Headache, unspecified: Secondary | ICD-10-CM | POA: Diagnosis not present

## 2024-03-22 DIAGNOSIS — R490 Dysphonia: Secondary | ICD-10-CM | POA: Diagnosis not present

## 2024-03-22 DIAGNOSIS — Z043 Encounter for examination and observation following other accident: Secondary | ICD-10-CM | POA: Diagnosis not present

## 2024-03-22 DIAGNOSIS — W19XXXA Unspecified fall, initial encounter: Secondary | ICD-10-CM | POA: Diagnosis not present

## 2024-03-22 DIAGNOSIS — R531 Weakness: Secondary | ICD-10-CM | POA: Diagnosis not present

## 2024-03-22 DIAGNOSIS — M549 Dorsalgia, unspecified: Secondary | ICD-10-CM | POA: Diagnosis not present

## 2024-03-22 DIAGNOSIS — R410 Disorientation, unspecified: Secondary | ICD-10-CM | POA: Diagnosis not present

## 2024-03-22 DIAGNOSIS — R5383 Other fatigue: Secondary | ICD-10-CM | POA: Diagnosis not present

## 2024-03-22 DIAGNOSIS — E785 Hyperlipidemia, unspecified: Secondary | ICD-10-CM | POA: Diagnosis not present

## 2024-03-22 DIAGNOSIS — M47812 Spondylosis without myelopathy or radiculopathy, cervical region: Secondary | ICD-10-CM | POA: Diagnosis not present

## 2024-03-22 DIAGNOSIS — G309 Alzheimer's disease, unspecified: Secondary | ICD-10-CM | POA: Diagnosis not present

## 2024-03-22 DIAGNOSIS — Z743 Need for continuous supervision: Secondary | ICD-10-CM | POA: Diagnosis not present

## 2024-03-23 DIAGNOSIS — R110 Nausea: Secondary | ICD-10-CM | POA: Diagnosis not present

## 2024-04-10 DIAGNOSIS — M545 Low back pain, unspecified: Secondary | ICD-10-CM | POA: Diagnosis not present

## 2024-04-10 DIAGNOSIS — E039 Hypothyroidism, unspecified: Secondary | ICD-10-CM | POA: Diagnosis not present

## 2024-04-10 DIAGNOSIS — G3183 Dementia with Lewy bodies: Secondary | ICD-10-CM | POA: Diagnosis not present

## 2024-04-10 DIAGNOSIS — G8929 Other chronic pain: Secondary | ICD-10-CM | POA: Diagnosis not present

## 2024-04-10 DIAGNOSIS — F0280 Dementia in other diseases classified elsewhere without behavioral disturbance: Secondary | ICD-10-CM | POA: Diagnosis not present

## 2024-04-10 DIAGNOSIS — I10 Essential (primary) hypertension: Secondary | ICD-10-CM | POA: Diagnosis not present

## 2024-05-08 DIAGNOSIS — I10 Essential (primary) hypertension: Secondary | ICD-10-CM | POA: Diagnosis not present

## 2024-05-08 DIAGNOSIS — G3183 Dementia with Lewy bodies: Secondary | ICD-10-CM | POA: Diagnosis not present

## 2024-05-08 DIAGNOSIS — F02811 Dementia in other diseases classified elsewhere, unspecified severity, with agitation: Secondary | ICD-10-CM | POA: Diagnosis not present

## 2024-05-08 DIAGNOSIS — E039 Hypothyroidism, unspecified: Secondary | ICD-10-CM | POA: Diagnosis not present

## 2024-05-08 DIAGNOSIS — L299 Pruritus, unspecified: Secondary | ICD-10-CM | POA: Diagnosis not present

## 2024-05-08 DIAGNOSIS — I959 Hypotension, unspecified: Secondary | ICD-10-CM | POA: Diagnosis not present

## 2024-05-08 DIAGNOSIS — R197 Diarrhea, unspecified: Secondary | ICD-10-CM | POA: Diagnosis not present

## 2024-06-05 DIAGNOSIS — G3183 Dementia with Lewy bodies: Secondary | ICD-10-CM | POA: Diagnosis not present

## 2024-06-05 DIAGNOSIS — I10 Essential (primary) hypertension: Secondary | ICD-10-CM | POA: Diagnosis not present

## 2024-06-05 DIAGNOSIS — B372 Candidiasis of skin and nail: Secondary | ICD-10-CM | POA: Diagnosis not present

## 2024-06-05 DIAGNOSIS — F02811 Dementia in other diseases classified elsewhere, unspecified severity, with agitation: Secondary | ICD-10-CM | POA: Diagnosis not present

## 2024-06-05 DIAGNOSIS — E039 Hypothyroidism, unspecified: Secondary | ICD-10-CM | POA: Diagnosis not present

## 2024-06-08 DIAGNOSIS — Z79899 Other long term (current) drug therapy: Secondary | ICD-10-CM | POA: Diagnosis not present
